# Patient Record
Sex: Female | Born: 1935
Health system: Southern US, Community
[De-identification: ages and names within clinical notes are randomized; demographics above are authoritative.]

## PROBLEM LIST (undated history)

## (undated) DIAGNOSIS — F419 Anxiety disorder, unspecified: Secondary | ICD-10-CM

## (undated) DIAGNOSIS — I10 Essential (primary) hypertension: Secondary | ICD-10-CM

## (undated) DIAGNOSIS — I8390 Asymptomatic varicose veins of unspecified lower extremity: Secondary | ICD-10-CM

## (undated) DIAGNOSIS — I2510 Atherosclerotic heart disease of native coronary artery without angina pectoris: Secondary | ICD-10-CM

## (undated) DIAGNOSIS — M1990 Unspecified osteoarthritis, unspecified site: Secondary | ICD-10-CM

## (undated) DIAGNOSIS — R609 Edema, unspecified: Secondary | ICD-10-CM

## (undated) DIAGNOSIS — Z8739 Personal history of other diseases of the musculoskeletal system and connective tissue: Secondary | ICD-10-CM

## (undated) DIAGNOSIS — K219 Gastro-esophageal reflux disease without esophagitis: Secondary | ICD-10-CM

## (undated) DIAGNOSIS — G8929 Other chronic pain: Secondary | ICD-10-CM

## (undated) DIAGNOSIS — R600 Localized edema: Secondary | ICD-10-CM

## (undated) DIAGNOSIS — N289 Disorder of kidney and ureter, unspecified: Secondary | ICD-10-CM

## (undated) DIAGNOSIS — M1712 Unilateral primary osteoarthritis, left knee: Secondary | ICD-10-CM

## (undated) DIAGNOSIS — R0600 Dyspnea, unspecified: Secondary | ICD-10-CM

## (undated) DIAGNOSIS — R519 Headache, unspecified: Secondary | ICD-10-CM

## (undated) DIAGNOSIS — F32A Depression, unspecified: Secondary | ICD-10-CM

## (undated) DIAGNOSIS — E782 Mixed hyperlipidemia: Secondary | ICD-10-CM

## (undated) DIAGNOSIS — F329 Major depressive disorder, single episode, unspecified: Secondary | ICD-10-CM

## (undated) DIAGNOSIS — R51 Headache: Secondary | ICD-10-CM

## (undated) HISTORY — DX: Essential (primary) hypertension: I10

## (undated) HISTORY — DX: Other chronic pain: G89.29

## (undated) HISTORY — PX: APPENDECTOMY: SHX54

## (undated) HISTORY — DX: Atherosclerotic heart disease of native coronary artery without angina pectoris: I25.10

## (undated) HISTORY — DX: Headache: R51

## (undated) HISTORY — PX: COLONOSCOPY: SHX174

## (undated) HISTORY — DX: Gastro-esophageal reflux disease without esophagitis: K21.9

## (undated) HISTORY — DX: Headache, unspecified: R51.9

## (undated) HISTORY — DX: Asymptomatic varicose veins of unspecified lower extremity: I83.90

## (undated) HISTORY — PX: OTHER SURGICAL HISTORY: SHX169

## (undated) HISTORY — DX: Localized edema: R60.0

## (undated) HISTORY — DX: Mixed hyperlipidemia: E78.2

## (undated) HISTORY — DX: Edema, unspecified: R60.9

## (undated) HISTORY — DX: Anxiety disorder, unspecified: F41.9

## (undated) HISTORY — DX: Unspecified osteoarthritis, unspecified site: M19.90

---

## 1984-09-13 HISTORY — PX: CHOLECYSTECTOMY: SHX55

## 1986-09-13 HISTORY — PX: TOTAL ABDOMINAL HYSTERECTOMY W/ BILATERAL SALPINGOOPHORECTOMY: SHX83

## 1998-04-17 ENCOUNTER — Inpatient Hospital Stay (HOSPITAL_COMMUNITY): Admission: AD | Admit: 1998-04-17 | Discharge: 1998-04-18 | Payer: Self-pay | Admitting: Cardiology

## 2005-12-21 ENCOUNTER — Ambulatory Visit: Payer: Self-pay | Admitting: Cardiology

## 2005-12-22 ENCOUNTER — Inpatient Hospital Stay (HOSPITAL_COMMUNITY): Admission: EM | Admit: 2005-12-22 | Discharge: 2005-12-22 | Payer: Self-pay | Admitting: Cardiology

## 2009-09-13 HISTORY — PX: OTHER SURGICAL HISTORY: SHX169

## 2009-09-18 ENCOUNTER — Encounter: Admission: RE | Admit: 2009-09-18 | Discharge: 2009-09-18 | Payer: Self-pay | Admitting: Orthopedic Surgery

## 2009-10-02 ENCOUNTER — Ambulatory Visit (HOSPITAL_COMMUNITY): Admission: RE | Admit: 2009-10-02 | Discharge: 2009-10-02 | Payer: Self-pay | Admitting: Orthopedic Surgery

## 2009-10-09 ENCOUNTER — Encounter: Admission: RE | Admit: 2009-10-09 | Discharge: 2009-10-09 | Payer: Self-pay | Admitting: Orthopedic Surgery

## 2009-10-16 ENCOUNTER — Encounter: Payer: Self-pay | Admitting: Cardiology

## 2009-10-27 ENCOUNTER — Encounter: Admission: RE | Admit: 2009-10-27 | Discharge: 2009-10-27 | Payer: Self-pay | Admitting: Orthopedic Surgery

## 2009-11-17 ENCOUNTER — Inpatient Hospital Stay (HOSPITAL_COMMUNITY): Admission: RE | Admit: 2009-11-17 | Discharge: 2009-11-19 | Payer: Self-pay | Admitting: Orthopedic Surgery

## 2009-12-22 ENCOUNTER — Encounter: Admission: RE | Admit: 2009-12-22 | Discharge: 2009-12-22 | Payer: Self-pay | Admitting: Orthopedic Surgery

## 2010-05-20 ENCOUNTER — Encounter: Admission: RE | Admit: 2010-05-20 | Discharge: 2010-05-20 | Payer: Self-pay | Admitting: Orthopedic Surgery

## 2010-07-08 ENCOUNTER — Encounter: Admission: RE | Admit: 2010-07-08 | Discharge: 2010-07-08 | Payer: Self-pay | Admitting: Orthopedic Surgery

## 2010-12-06 LAB — BASIC METABOLIC PANEL
BUN: 13 mg/dL (ref 6–23)
BUN: 14 mg/dL (ref 6–23)
CO2: 28 mEq/L (ref 19–32)
Calcium: 8.3 mg/dL — ABNORMAL LOW (ref 8.4–10.5)
Chloride: 104 mEq/L (ref 96–112)
Creatinine, Ser: 0.98 mg/dL (ref 0.4–1.2)
Creatinine, Ser: 1 mg/dL (ref 0.4–1.2)
GFR calc non Af Amer: 56 mL/min — ABNORMAL LOW (ref >60)
Glucose, Bld: 122 mg/dL — ABNORMAL HIGH (ref 70–99)
Glucose, Bld: 127 mg/dL — ABNORMAL HIGH (ref 70–99)
Potassium: 3.7 mEq/L (ref 3.5–5.1)

## 2010-12-06 LAB — CBC
HCT: 29.9 % — ABNORMAL LOW (ref 36.0–46.0)
HCT: 42.6 % (ref 36.0–46.0)
Hemoglobin: 14.4 g/dL (ref 12.0–15.0)
MCHC: 34.4 g/dL (ref 30.0–36.0)
MCV: 93.1 fL (ref 78.0–100.0)
MCV: 93.3 fL (ref 78.0–100.0)
Platelets: 139 10*3/uL — ABNORMAL LOW (ref 150–400)
Platelets: 172 10*3/uL (ref 150–400)
Platelets: 272 10*3/uL (ref 150–400)
RBC: 3.44 MIL/uL — ABNORMAL LOW (ref 3.87–5.11)
RBC: 4.57 MIL/uL (ref 3.87–5.11)
RDW: 13.8 % (ref 11.5–15.5)
RDW: 14.1 % (ref 11.5–15.5)
WBC: 10 10*3/uL (ref 4.0–10.5)

## 2010-12-06 LAB — URINALYSIS, ROUTINE W REFLEX MICROSCOPIC
Bilirubin Urine: NEGATIVE
Glucose, UA: NEGATIVE mg/dL
Ketones, ur: NEGATIVE mg/dL
Nitrite: NEGATIVE
Nitrite: NEGATIVE
Protein, ur: NEGATIVE mg/dL
Specific Gravity, Urine: 1.028 (ref 1.005–1.030)
Urobilinogen, UA: 0.2 mg/dL (ref 0.0–1.0)
pH: 5.5 (ref 5.0–8.0)

## 2010-12-06 LAB — COMPREHENSIVE METABOLIC PANEL
Alkaline Phosphatase: 105 U/L (ref 39–117)
BUN: 25 mg/dL — ABNORMAL HIGH (ref 6–23)
CO2: 26 mEq/L (ref 19–32)
Chloride: 101 mEq/L (ref 96–112)
Creatinine, Ser: 1.13 mg/dL (ref 0.4–1.2)
GFR calc non Af Amer: 47 mL/min — ABNORMAL LOW (ref >60)
Glucose, Bld: 109 mg/dL — ABNORMAL HIGH (ref 70–99)
Potassium: 3.7 mEq/L (ref 3.5–5.1)
Total Bilirubin: 0.6 mg/dL (ref 0.3–1.2)

## 2010-12-06 LAB — PROTIME-INR
INR: 1.13 (ref 0.00–1.49)
INR: 1.19 (ref 0.00–1.49)
Prothrombin Time: 12.5 seconds (ref 11.6–15.2)
Prothrombin Time: 14.4 seconds (ref 11.6–15.2)
Prothrombin Time: 15 seconds (ref 11.6–15.2)

## 2010-12-06 LAB — DIFFERENTIAL
Basophils Absolute: 0 10*3/uL (ref 0.0–0.1)
Basophils Relative: 1 % (ref 0–1)
Lymphocytes Relative: 30 % (ref 12–46)
Neutro Abs: 6.3 10*3/uL (ref 1.7–7.7)
Neutrophils Relative %: 63 % (ref 43–77)

## 2010-12-06 LAB — URINE MICROSCOPIC-ADD ON

## 2010-12-06 LAB — URINE CULTURE: Colony Count: NO GROWTH

## 2010-12-06 LAB — ABO/RH: ABO/RH(D): A NEG

## 2010-12-06 LAB — TYPE AND SCREEN

## 2010-12-06 LAB — APTT: aPTT: 30 seconds (ref 24–37)

## 2011-01-29 NOTE — H&P (Signed)
NAME:  Renee Dudley, Renee Dudley             ACCOUNT NO.:  [id number]   MEDICAL RECORD NO.:  [id number]          PATIENT TYPE:  INP   LOCATION:  6531                         FACILITY:  [facility]   PHYSICIAN:  Colleen Can. Deborah Chalk, M.D.DATE OF BIRTH:  1936/05/27   DATE OF ADMISSION:  12/22/2005  DATE OF DISCHARGE:                                HISTORY & PHYSICAL   CHIEF COMPLAINT:  Chest pain.   HISTORY OF PRESENT ILLNESS:  The patient is an obese 75 year old white  female who presents on transfer from [facility] after having an  episode of chest pain. She presented to Dr. Jeannette How office on the morning  of December 20, 2005 complaining of chest discomfort that was described as a  tight pressure-like sensation. She was immediately sent to the emergency  room where she received oxygen, nitroglycerin sublingually as well as  morphine. The patient lasted for approximately four hours. She noted that  when she had awakened earlier that morning at about 7:30 she had had the  onset. She did feel a little bit lightheaded but had no other associated  symptoms. She does mention that last Thursday night she had had some sharp  chest pain that was completely different from this presentation and that  lasted for just several minutes. Over the course of the past week, she has  just felt sluggish and just not herself. She was subsequently seen in the  emergency room. She was admitted and has ruled out negative for myocardial  infarction. She is now transferred to [facility] for elective  cardiac catheterization. She is currently pain free.   PAST MEDICAL HISTORY:  1.  Hypertension.  2.  Hypercholesterolemia.  3.  Obesity.  4.  Chronic back pain.  5.  Chronic anxiety.  6.  Chronic migraine headaches.  7.  Remote history of chronic hematuria with a urology workup in the 1990s      which was negative.  8.  Total abdominal hysterectomy and bilateral salpingo-oophorectomy in 1988      for  menorrhagia.  9.  History of cholecystectomy in 1986.  10. History of catheterization, she states, three to four years ago;      however, medical records say approximately 10 years ago. She reports      that this was unremarkable. She has had a stress Cardiolite in July of      2005 which was unremarkable and previous dobutamine echocardiogram in      April 2002.   ALLERGIES:  Her allergies are PENICILLIN.   CURRENT MEDICATIONS:  1.  Lasix 40 mg a day.  2.  Flexeril 10 mg at bedtime for spasms.  3.  Topamax 15 mg b.i.d. for migraines.  4.  Aspirin daily.  5.  Stool softeners as needed for constipation.  6.  Hydrocodone 2 times a day for pain.  7.  Xanax p.r.n.  8.  Inderal LA 120 mg for hypertension.   SOCIAL HISTORY:  She is widowed. She lives by herself. She has no alcohol or  tobacco products. She is retired.   FAMILY HISTORY:  Sister died of a  stroke in her 21s. Brother died in a car  accident at the age 67. Mother died age 65 of heart failure following a  stroke. Father died at 60 after having a stroke.   REVIEW OF SYSTEMS:  Basically as noted above. She has really had no other  significant episodes of chest pain. She has felt somewhat weak and fatigued  and has had palpitations. She denies any recent fever, flu or cough. She has  had no shortness of breath, abdominal pain, constipation or diarrhea. She  does have some complaints of chronic lower extremity edema. She has had no  frank syncope.   LABORATORY DATA:  EKG shows no acute changes. Chest x-ray is unremarkable.  Two-D echocardiogram performed showed EF of 60% with no wall motion  abnormalities. CBC was normal. Chemistries were normal except for a glucose  of 118. All cardiac enzymes were negative. INR was 1.0.   OVERALL IMPRESSION:  1.  Prolonged episode of chest pain.  2.  Multiple cardiovascular risk factors which include obesity, hypertension      and hyperlipidemia which is currently not being treated.    PLAN:  She is accepted to transfer to [facility]. Will plan for  cardiac catheterization later today. Will continue with topical nitrites.  Lipid panel will be checked. Will start her on protein pump inhibitor as  well. Other home medications will be continued. Further treatment plan to  follow per Dr. Ronnald Nian discretion.      Sharlee Blew, N.P.      Colleen Can. Deborah Chalk, M.D.  Electronically Signed    LC/MEDQ  D:  12/22/2005  T:  12/22/2005  Job:  [id number]   cc:   Fara Chute  Fax: [phone]   Selinda Flavin  Fax: [phone]

## 2011-01-29 NOTE — Cardiovascular Report (Signed)
NAME:  Renee Dudley, Renee Dudley             ACCOUNT NO.:  [id number]   MEDICAL RECORD NO.:  [id number]          PATIENT TYPE:  INP   LOCATION:  6531                         FACILITY:  [facility]   PHYSICIAN:  Colleen Can. Deborah Chalk, M.D.DATE OF BIRTH:  [birth year]   DATE OF PROCEDURE:  12/22/2005  DATE OF DISCHARGE:                              CARDIAC CATHETERIZATION   DATE OF PROCEDURE:  December 22, 2005   PROCEDURE:  Left heart catheterization with selective coronary angiography  and left ventricular angiography.   TYPE AND SITE OF ANGIOGRAPHY:  Percutaneous right femoral artery.   CATHETER:  A 6-French 4-curved Judkins right and left coronary cath, 6-  French pigtail ventriculographic catheter.   CONTRAST MATERIAL:  Omnipaque.   MEDICATIONS GIVEN PRIOR TO PROCEDURE:  Valium 10 mg p.o.   MEDICATIONS GIVEN DURING THE PROCEDURE:  Versed 2 mg IV.   COMMENTS:  The patient tolerated procedure well.   HEMODYNAMIC DATA:  The aortic pressure was 154/73, LV was 154/3.  There is  no aortic valve gradient noted on pullback.   ANGIOGRAPHIC DATA:  1.  Left main coronary artery is short.  2.  Left circumflex:  Left circumflex had irregularities.  It continues      mainly as a large posterolateral branch and also has two proximal      marginal vessels.  It has minor diffuse irregularities.  3.  Left anterior descending had 20-30% narrowing proximally.  There are      scattered irregularities but no focal obstructive disease otherwise.  4.  Right coronary artery:  The right coronary artery has irregularities at      the acute margin of the heart but no severe focal disease is present.      The right coronary artery is a dominant vessel.  5.  Left ventricular angiogram was performed in the RAO position.  Overall      cardiac size and silhouette are normal.  The global ejection fraction is      60%.  Regional wall motion is normal.   OVERALL IMPRESSION:  1.  Normal left ventricular function.  2.  Mild  scattered coronary atherosclerosis with 20-30% proximal left      anterior descending disease with minor irregularities in the right      coronary artery and left circumflex systems.   DISCUSSION:  Overall, it is felt the patient's chest pain syndrome is not  cardiac in nature.   Angio-Seal was performed.      Colleen Can. Deborah Chalk, M.D.  Electronically Signed     SNT/MEDQ  D:  12/22/2005  T:  12/22/2005  Job:  [id number]   cc:   Fara Chute  Fax: [phone]   Selinda Flavin  Fax: [phone]

## 2011-01-29 NOTE — Discharge Summary (Signed)
NAME:  Renee Dudley, Renee Dudley             ACCOUNT NO.:  [id number]   MEDICAL RECORD NO.:  [id number]          PATIENT TYPE:  INP   LOCATION:  6531                         FACILITY:  [facility]   PHYSICIAN:  Colleen Can. Deborah Chalk, M.D.DATE OF BIRTH:  Jan 01, 1936   DATE OF ADMISSION:  12/22/2005  DATE OF DISCHARGE:  12/22/2005                                 DISCHARGE SUMMARY   PRIMARY DISCHARGE DIAGNOSIS:  Chest pain with negative cardiac enzymes and  subsequent elective cardiac catheterization showing normal left ventricular  function and mild scattered coronary atherosclerosis with 20-30% proximal  left anterior descending artery disease and minor irregularities in the  right coronary and left circumflex system.  It is felt that the patient's  chest pain syndrome is not cardiac in nature.   SECONDARY DISCHARGE DIAGNOSES:  1.  Hypertension.  2.  Hyperlipidemia.  3.  Morbid obesity.  4.  Chronic back pain.  5.  Chronic anxiety.  6.  Chronic migraines.   HISTORY OF PRESENT ILLNESS:  The patient is an obese 75 year old white  female who presented for transfer from [facility] with an episode of  chest discomfort.  She had seen her primary care in the office earlier on  the morning of December 20, 2005 complaining of chest discomfort that was  described as a tight pressure-like sensation.  She was sent to the emergency  room and subsequently transferred here for further evaluation.   Please see dictated history and physical for further patient presentation  and profile.   LABORATORY DATA:  EKG showed no acute changes.  Chest x-ray was  unremarkable.  2-D echocardiogram performed at [facility] showed an  EF of 60% with no wall motion abnormalities.  CBC was normal.  Chemistries  were normal except for glucose of 118.  All cardiac enzymes were negative.  Her INR was 1.0.   HOSPITAL COURSE:  The patient was admitted electively.  She underwent  cardiac catheterization later on that day  which showed normal LV function  and mild coronary disease.  The patient's chest pain was not felt to be  cardiac in nature.  She did have AngioSeal to the right femoral artery and  subsequently was a satisfactory candidate for discharge once bedrest was  complete.   CONDITION ON DISCHARGE:  Stable.   DISCHARGE MEDICATIONS:  Discharge medicines will be continued as per her  previous home routine which includes Lasix 40 mg a day, Flexeril 10 mg at  bed time, Topamax 50 a day, aspirin daily, stool softener daily, enoxaparin  120 a day, Xanax p.r.n. and hydrocodone and Tylenol as needed.   PLAN:  We will see her back in our office in approximately two to three  weeks, certainly sooner if problems arise.  Otherwise, we will have her  follow up with primary care.      Sharlee Blew, N.P.      Colleen Can. Deborah Chalk, M.D.  Electronically Signed    LC/MEDQ  D:  01/19/2006  T:  01/19/2006  Job:  [id number]   cc:   Selinda Flavin  Fax: [phone]   Fara Chute  Fax: [phone]

## 2011-05-13 ENCOUNTER — Encounter: Payer: Self-pay | Admitting: Cardiology

## 2011-05-14 ENCOUNTER — Ambulatory Visit (INDEPENDENT_AMBULATORY_CARE_PROVIDER_SITE_OTHER): Payer: 59 | Admitting: Cardiology

## 2011-05-14 ENCOUNTER — Encounter: Payer: Self-pay | Admitting: Cardiology

## 2011-05-14 VITALS — BP 121/77 | HR 73 | Ht 66.0 in | Wt 239.0 lb

## 2011-05-14 DIAGNOSIS — E782 Mixed hyperlipidemia: Secondary | ICD-10-CM

## 2011-05-14 DIAGNOSIS — I2510 Atherosclerotic heart disease of native coronary artery without angina pectoris: Secondary | ICD-10-CM

## 2011-05-14 DIAGNOSIS — R079 Chest pain, unspecified: Secondary | ICD-10-CM | POA: Insufficient documentation

## 2011-05-14 DIAGNOSIS — I10 Essential (primary) hypertension: Secondary | ICD-10-CM

## 2011-05-14 NOTE — Assessment & Plan Note (Signed)
Single episode, no recurrence since. ECG is nonspecific. After discussing the matter, plan will be observation at this point on medical therapy.

## 2011-05-14 NOTE — Patient Instructions (Signed)
Your physician you to follow up in 1 year. You will receive a reminder letter in the mail one-two months in advance. If you don't receive a letter, please call our office to schedule the follow-up appointment. Your physician recommends that you continue on your current medications as directed. Please refer to the Current Medication list given to you today. 

## 2011-05-14 NOTE — Assessment & Plan Note (Signed)
On statin therapy, followed by Dr. Dimas Aguas. Would aim for LDL close to 70.

## 2011-05-14 NOTE — Assessment & Plan Note (Signed)
Mild, nonobstructive disease by catheterization in 2007. Followup Myoview from last year was low risk with Dr. Deborah Chalk. Continue observation with office followup down the road. We can see her back sooner if symptoms intervene.

## 2011-05-14 NOTE — Assessment & Plan Note (Signed)
Blood pressure well-controlled today. 

## 2011-05-14 NOTE — Progress Notes (Signed)
Clinical Summary Renee Dudley is a 75 y.o.female referred for cardiology consultation by Dr. Dimas Aguas. She had an episode of chest pain and elevated blood pressure requiring observation at [facility] recently. Records were reviewed. She states that this occurred around the time that she was emotionally upset following the death of a close friend. She ruled out for myocardial infarction, and has had no further symptoms since discharge. Reports no major exertional symptomatology, limited by right knee pain.  Record review finds previous cardiac catheterization by Dr. Deborah Chalk back in 2007 which demonstrated mild, nonobstructive CAD. She last saw him in clinic back in January of last year for preoperative assessment prior to right knee replacement, and had a low-risk adenosine Myoview at that time.  We reviewed her medications. She does state that Zoloft is new since her hospital admission approximately one month ago.  Allergies  Allergen Reactions  . Penicillins     Medication list reviewed.  Past Medical History  Diagnosis Date  . Essential hypertension, benign   . Coronary atherosclerosis of native coronary artery   . Mixed hyperlipidemia   . GERD (gastroesophageal reflux disease)   . Coronary atherosclerosis of native coronary artery     Nonobstructive  . Anxiety   . Chronic headaches     Past Surgical History  Procedure Date  . Cystoscopy 1993, 1994  . Appendectomy   . Total abdominal hysterectomy w/ bilateral salpingoophorectomy 1988  . Right total knee arthroplasty 2011  . Cholecystectomy 1986    Family History  Problem Relation Age of Onset  . Stroke Sister   . Stroke Father     Died age 93  . Heart failure Mother     Died age 76    Social History Renee Dudley reports that she has never smoked. She has never used smokeless tobacco. Renee Dudley reports that she does not drink alcohol.  Review of Systems No palpitations, no cough, no orthopnea or PND. No edema. Chronic right  knee pain. Otherwise reviewed and negative.  Physical Examination Filed Vitals:   05/14/11 1325  BP: 121/77  Pulse: 73  Overweight woman in no acute distress. HEENT: Conjunctiva and lids are normal, oropharnyx with moist mucosa. Neck: Supple, no elevated JVP or carotid bruits, no thyromegaly. Lungs: Clear to auscultation, nonlabored. Cardiac: Regular rate and rhythm, no S3 gallop or rub. Abdomen: Soft, nontender, bowel sounds present. Skin.: Warm and dry. Musculoskeletal: No kyphosis. Extremity: No pitting edema, some right knee swelling, well-healed surgical scar. Neuropsychiatric: Alert and oriented x3, affect appropriate.   ECG Reviewed in EMR.  Studies Cardiac catheterization 12/22/2005: ANGIOGRAPHIC DATA:  1.  Left main coronary artery is short.  2.  Left circumflex:  Left circumflex had irregularities.  It continues      mainly as a large posterolateral branch and also has two proximal      marginal vessels.  It has minor diffuse irregularities.  3.  Left anterior descending had 20-30% narrowing proximally.  There are      scattered irregularities but no focal obstructive disease otherwise.  4.  Right coronary artery:  The right coronary artery has irregularities at      the acute margin of the heart but no severe focal disease is present.      The right coronary artery is a dominant vessel.  5.  Left ventricular angiogram was performed in the RAO position.  Overall      cardiac size and silhouette are normal.  The global ejection fraction is  60%.  Regional wall motion is normal.   Problem List and Plan

## 2012-03-28 ENCOUNTER — Other Ambulatory Visit: Payer: Self-pay | Admitting: Orthopedic Surgery

## 2012-03-28 DIAGNOSIS — M545 Low back pain, unspecified: Secondary | ICD-10-CM

## 2012-03-29 ENCOUNTER — Ambulatory Visit
Admission: RE | Admit: 2012-03-29 | Discharge: 2012-03-29 | Disposition: A | Discharge status: Home / Self Care | Payer: 59 | Source: Ambulatory Visit | Attending: Orthopedic Surgery | Admitting: Orthopedic Surgery

## 2012-03-29 ENCOUNTER — Other Ambulatory Visit: Payer: Self-pay | Admitting: Orthopedic Surgery

## 2012-03-29 VITALS — BP 154/77 | HR 63

## 2012-03-29 DIAGNOSIS — M545 Low back pain, unspecified: Secondary | ICD-10-CM

## 2012-03-29 MED ORDER — METHYLPREDNISOLONE ACETATE 40 MG/ML INJ SUSP (RADIOLOG
120.0000 mg | Freq: Once | INTRAMUSCULAR | Status: AC
  Administered 2012-03-29: 120 mg via EPIDURAL

## 2012-03-29 MED ORDER — IOHEXOL 180 MG/ML  SOLN
1.0000 mL | Freq: Once | INTRAMUSCULAR | Status: AC | PRN
  Administered 2012-03-29: 1 mL via EPIDURAL

## 2013-09-21 ENCOUNTER — Encounter: Payer: Self-pay | Admitting: Cardiology

## 2013-09-23 ENCOUNTER — Encounter: Payer: Self-pay | Admitting: Cardiology

## 2013-10-11 ENCOUNTER — Encounter: Payer: Self-pay | Admitting: *Deleted

## 2013-10-11 ENCOUNTER — Encounter: Payer: Self-pay | Admitting: Cardiology

## 2013-10-11 ENCOUNTER — Ambulatory Visit (INDEPENDENT_AMBULATORY_CARE_PROVIDER_SITE_OTHER): Payer: Medicare Other | Admitting: Cardiology

## 2013-10-11 VITALS — BP 115/70 | HR 71 | Ht 65.0 in | Wt 255.0 lb

## 2013-10-11 DIAGNOSIS — I2510 Atherosclerotic heart disease of native coronary artery without angina pectoris: Secondary | ICD-10-CM

## 2013-10-11 DIAGNOSIS — I10 Essential (primary) hypertension: Secondary | ICD-10-CM

## 2013-10-11 DIAGNOSIS — R072 Precordial pain: Secondary | ICD-10-CM

## 2013-10-11 DIAGNOSIS — E782 Mixed hyperlipidemia: Secondary | ICD-10-CM

## 2013-10-11 NOTE — Patient Instructions (Signed)
Your physician has requested that you have a lexiscan myoview. For further information please visit [email or web address]. Please follow instruction sheet, as given. Office will contact with results via phone or letter.   Continue all current medications. Follow up based off test results

## 2013-10-11 NOTE — Assessment & Plan Note (Signed)
Continues on Crestor, followed by Dr. Howard. 

## 2013-10-11 NOTE — Progress Notes (Signed)
Clinical Summary Ms. Popper is a 78 y.o.female last seen in August 2012. Record review finds recent admission at [facility] with chest pain concerning for unstable angina. She was admitted by the hospitalist team and ruled out for myocardial infarction. She was placed on Imdur and continued on medical therapy. Also diagnosed with UTI which was treated.  Recent lab work shows BUN 18, creatinine 0.9, potassium 3.9, hemoglobin 10.8, platelets 209.  She reports fewer episodes of chest pain since being on long-acting nitrate, did have to use a sublingual nitroglycerin recently with exertional chest pain. We reviewed her history and discussed her symptoms today, also further options for evaluation and management.   Allergies  Allergen Reactions  . Penicillins Other (See Comments)    Pt can remember, happened 50 years ago  . Zocor [Simvastatin]     Current Outpatient Prescriptions  Medication Sig Dispense Refill  . ALPRAZolam (XANAX) 0.25 MG tablet Take 0.25 mg by mouth daily as needed.        [name] aspirin 81 MG tablet Take 81 mg by mouth daily.        [name] atenolol (TENORMIN) 50 MG tablet Take 50 mg by mouth daily.        . cyclobenzaprine (FLEXERIL) 10 MG tablet Take 10 mg by mouth daily.        [name] docusate sodium (COLACE) 100 MG capsule Take 100-400 mg by mouth at bedtime.        [name] esomeprazole (NEXIUM) 40 MG capsule Take 40 mg by mouth daily before breakfast.        . furosemide (LASIX) 40 MG tablet Take 40 mg by mouth daily.        [name] HYDROcodone-acetaminophen (NORCO/VICODIN) 5-325 MG per tablet Take 1 tablet by mouth every 6 (six) hours as needed for moderate pain.      . isosorbide mononitrate (ISMO,MONOKET) 20 MG tablet Take 20 mg by mouth daily.      . nitroGLYCERIN (NITROSTAT) 0.4 MG SL tablet Place 0.4 mg under the tongue every 5 (five) minutes as needed for chest pain.      . potassium chloride SA (K-DUR,KLOR-CON) 20 MEQ tablet Take 20 mEq by mouth daily.      . rosuvastatin (CRESTOR) 5  MG tablet Take 5 mg by mouth daily.      . sertraline (ZOLOFT) 25 MG tablet Take 25 mg by mouth daily.        [name] topiramate (TOPAMAX) 50 MG tablet Take 50 mg by mouth 2 (two) times daily.         No current facility-administered medications for this visit.    Past Medical History  Diagnosis Date  . Essential hypertension, benign   . Mixed hyperlipidemia   . GERD (gastroesophageal reflux disease)   . Coronary atherosclerosis of native coronary artery     Nonobstructive by catheterization 2007  . Anxiety   . Chronic headaches   . DJD (degenerative joint disease)   . Obesity   . Varicose veins   . Peripheral edema     Social History Ms. Virtue reports that she has never smoked. She has never used smokeless tobacco. Ms. Wigle reports that she does not drink alcohol.  Review of Systems No palpitations, dizziness, syncope. Has chronic knee pain following joint replacement, limits her ambulation.  Physical Examination Filed Vitals:   10/11/13 1431  BP: 115/70  Pulse: 71   Filed Weights   10/11/13 1431  Weight: 255 lb (115.667 kg)  Overweight woman in no acute distress.  HEENT: Conjunctiva and lids are normal, oropharnyx with moist mucosa.  Neck: Supple, no elevated JVP or carotid bruits, no thyromegaly.  Lungs: Clear to auscultation, nonlabored.  Cardiac: Regular rate and rhythm, no S3 gallop or rub.  Abdomen: Soft, nontender, bowel sounds present.  Skin.: Warm and dry.  Musculoskeletal: No kyphosis.  Extremity: No pitting edema, some right knee swelling. Neuropsychiatric: Alert and oriented x3, affect appropriate.   Problem List and Plan   Coronary atherosclerosis of native coronary artery History of nonobstructive disease as of 2007, although symptoms of increasing angina over the last several weeks. She recently ruled out for MI during hospital observation at [facility]. Agree with the addition of long-acting nitrate. Will proceed with further evaluation via  Lexiscan Cardiolite, and then decide whether we continue with medical therapy and observation, or consider invasive cardiac evaluation.  Essential hypertension, benign Blood pressure is normal today.  Mixed hyperlipidemia Continues on Crestor, followed by Dr. Nadara Mustard.    Satira Sark, M.D., F.A.C.C.

## 2013-10-11 NOTE — Assessment & Plan Note (Signed)
History of nonobstructive disease as of 2007, although symptoms of increasing angina over the last several weeks. She recently ruled out for MI during hospital observation at [facility]. Agree with the addition of long-acting nitrate. Will proceed with further evaluation via Lexiscan Cardiolite, and then decide whether we continue with medical therapy and observation, or consider invasive cardiac evaluation.

## 2013-10-11 NOTE — Assessment & Plan Note (Signed)
Blood pressure is normal today. 

## 2013-10-23 ENCOUNTER — Encounter (HOSPITAL_COMMUNITY): Payer: Self-pay

## 2013-10-23 ENCOUNTER — Encounter (HOSPITAL_COMMUNITY)
Admission: RE | Admit: 2013-10-23 | Discharge: 2013-10-23 | Disposition: A | Discharge status: Home / Self Care | Payer: Medicare Other | Source: Ambulatory Visit | Attending: Cardiology | Admitting: Cardiology

## 2013-10-23 DIAGNOSIS — R079 Chest pain, unspecified: Secondary | ICD-10-CM

## 2013-10-23 DIAGNOSIS — E785 Hyperlipidemia, unspecified: Secondary | ICD-10-CM | POA: Insufficient documentation

## 2013-10-23 DIAGNOSIS — I10 Essential (primary) hypertension: Secondary | ICD-10-CM | POA: Insufficient documentation

## 2013-10-23 DIAGNOSIS — I209 Angina pectoris, unspecified: Secondary | ICD-10-CM | POA: Insufficient documentation

## 2013-10-23 DIAGNOSIS — R072 Precordial pain: Secondary | ICD-10-CM

## 2013-10-23 DIAGNOSIS — I2510 Atherosclerotic heart disease of native coronary artery without angina pectoris: Secondary | ICD-10-CM

## 2013-10-23 MED ORDER — TECHNETIUM TC 99M SESTAMIBI GENERIC - CARDIOLITE
10.0000 | Freq: Once | INTRAVENOUS | Status: AC | PRN
  Administered 2013-10-23: 10 via INTRAVENOUS

## 2013-10-23 MED ORDER — REGADENOSON 0.4 MG/5ML IV SOLN
INTRAVENOUS | Status: AC
  Administered 2013-10-23: 0.4 mg via INTRAVENOUS
  Filled 2013-10-23: qty 5

## 2013-10-23 MED ORDER — TECHNETIUM TC 99M SESTAMIBI - CARDIOLITE
30.0000 | Freq: Once | INTRAVENOUS | Status: AC | PRN
Start: 2013-10-23 — End: 2013-10-23
  Administered 2013-10-23: 30 via INTRAVENOUS

## 2013-10-23 MED ORDER — SODIUM CHLORIDE 0.9 % IJ SOLN
INTRAMUSCULAR | Status: AC
  Administered 2013-10-23: 10 mL via INTRAVENOUS
  Filled 2013-10-23: qty 10

## 2013-10-23 NOTE — Progress Notes (Signed)
Stress Lab Nurses Notes - [name]  Renee Dudley 10/23/2013 Reason for doing test: Chest Pain Type of test: Wille Glaser Nurse performing test: Gerrit Halls, RN Nuclear Medicine Tech: Melburn Hake Echo Tech: Not Applicable MD performing test: S. McDowell/K.Purcell Nails NP Family MD: Nadara Mustard Test explained and consent signed: yes IV started: 22g jelco, Saline lock flushed, No redness or edema and Saline lock started in radiology Symptoms:headache  Treatment/Intervention: None Reason test stopped: protocol completed After recovery IV was: Discontinued via X-ray tech and No redness or edema Patient to return to [facility]. Med at : 12:00 Patient discharged: Home Patient's Condition upon discharge was: stable Comments:During test BP 108/53 & HR 76.  Recovery BP 121/58 & HR 65.  Symptoms resolved in recovery.  Geanie Cooley T

## 2013-11-07 ENCOUNTER — Ambulatory Visit: Payer: 59 | Admitting: Cardiology

## 2014-01-07 ENCOUNTER — Encounter: Payer: Self-pay | Admitting: Cardiology

## 2014-01-07 ENCOUNTER — Ambulatory Visit (INDEPENDENT_AMBULATORY_CARE_PROVIDER_SITE_OTHER): Payer: Medicare Other | Admitting: Cardiology

## 2014-01-07 VITALS — BP 138/77 | HR 62 | Ht 66.0 in | Wt 258.0 lb

## 2014-01-07 DIAGNOSIS — I10 Essential (primary) hypertension: Secondary | ICD-10-CM

## 2014-01-07 DIAGNOSIS — I2510 Atherosclerotic heart disease of native coronary artery without angina pectoris: Secondary | ICD-10-CM

## 2014-01-07 DIAGNOSIS — E782 Mixed hyperlipidemia: Secondary | ICD-10-CM

## 2014-01-07 DIAGNOSIS — R0989 Other specified symptoms and signs involving the circulatory and respiratory systems: Secondary | ICD-10-CM

## 2014-01-07 NOTE — Assessment & Plan Note (Signed)
Symptomatically stable on medical therapy, followup Cardiolite noted above. Plan will be to continue observation unless her symptoms progress. Six-month visit scheduled.

## 2014-01-07 NOTE — Assessment & Plan Note (Signed)
No change to current regimen. 

## 2014-01-07 NOTE — Assessment & Plan Note (Signed)
Questionable on left. She does have known coronary atherosclerosis, already on aspirin and Crestor. We will obtain carotid Dopplers to exclude any significant obstruction that might require further intervention.

## 2014-01-07 NOTE — Assessment & Plan Note (Signed)
Continues on Crestor, followed by Dr. Howard. 

## 2014-01-07 NOTE — Patient Instructions (Signed)
Your physician recommends that you schedule a follow-up appointment in: 6 months. You will receive a reminder letter in the mail in about 4 months reminding you to call and schedule your appointment. If you don't receive this letter, please contact our office. Your physician recommends that you continue on your current medications as directed. Please refer to the Current Medication list given to you today. Your physician has requested that you have a carotid duplex. This test is an ultrasound of the carotid arteries in your neck. It looks at blood flow through these arteries that supply the brain with blood. Allow one hour for this exam. There are no restrictions or special instructions.

## 2014-01-07 NOTE — Progress Notes (Signed)
Clinical Summary Renee Dudley is a 78 y.o.female last seen in January of this year. Followup Lexiscan Cardiolite was obtained in February showing no diagnostic ST segment changes, breast attenuation without clear evidence of scar or ischemia, LVEF 77%. She was managed medically.  Importantly, she has been doing well without any significant angina symptoms. She reports typical ADLs without limitation. States that she spent most of the winter months making Christmas ornaments which has been her traditional the last 20 years.  She does tell me that she is worried about her carotid arteries. She is not having symptoms, however there is a significant history of stroke in her family. She has never had carotid Dopplers based on record review.   Allergies  Allergen Reactions  . Penicillins Other (See Comments)    Pt can remember, happened 50 years ago  . Levofloxacin Other (See Comments)    Weakness   . Zocor [Simvastatin]     Current Outpatient Prescriptions  Medication Sig Dispense Refill  . ALPRAZolam (XANAX) 0.25 MG tablet Take 0.25 mg by mouth daily as needed.        [name] aspirin 81 MG tablet Take 81 mg by mouth daily.        [name] atenolol (TENORMIN) 50 MG tablet Take 50 mg by mouth daily.        . cyclobenzaprine (FLEXERIL) 10 MG tablet Take 10 mg by mouth daily.        [name] docusate sodium (COLACE) 100 MG capsule Take 100-400 mg by mouth at bedtime.        [name] esomeprazole (NEXIUM) 40 MG capsule Take 40 mg by mouth daily before breakfast.        . furosemide (LASIX) 40 MG tablet Take 40 mg by mouth daily.        [name] HYDROcodone-acetaminophen (NORCO/VICODIN) 5-325 MG per tablet Take 1 tablet by mouth every 6 (six) hours as needed for moderate pain.      . nitroGLYCERIN (NITROSTAT) 0.4 MG SL tablet Place 0.4 mg under the tongue every 5 (five) minutes as needed for chest pain.      . rosuvastatin (CRESTOR) 5 MG tablet Take 5 mg by mouth daily.      . sertraline (ZOLOFT) 25 MG tablet Take 25 mg by  mouth daily.        [name] topiramate (TOPAMAX) 50 MG tablet Take 50 mg by mouth 2 (two) times daily.         No current facility-administered medications for this visit.    Past Medical History  Diagnosis Date  . Essential hypertension, benign   . Mixed hyperlipidemia   . GERD (gastroesophageal reflux disease)   . Coronary atherosclerosis of native coronary artery     Nonobstructive by catheterization 2007  . Anxiety   . Chronic headaches   . DJD (degenerative joint disease)   . Obesity   . Varicose veins   . Peripheral edema     Social History Renee Dudley reports that she has never smoked. She has never used smokeless tobacco. Renee Dudley reports that she does not drink alcohol.  Review of Systems Negative except as outlined.  Physical Examination Filed Vitals:   01/07/14 1107  BP: 138/77  Pulse: 62   Filed Weights   01/07/14 1107  Weight: 258 lb (117.028 kg)    Overweight woman in no acute distress.  HEENT: Conjunctiva and lids are normal, oropharnyx with moist mucosa.  Neck: Supple, no elevated JVP, questionable left carotid bruit, no  thyromegaly.  Lungs: Clear to auscultation, nonlabored.  Cardiac: Regular rate and rhythm, no S3 gallop or rub.  Abdomen: Soft, nontender, bowel sounds present.  Skin.: Warm and dry.  Musculoskeletal: No kyphosis.  Extremity: No pitting edema, some right knee swelling.  Neuropsychiatric: Alert and oriented x3, affect appropriate.   Problem List and Plan   Coronary atherosclerosis of native coronary artery Symptomatically stable on medical therapy, followup Cardiolite noted above. Plan will be to continue observation unless her symptoms progress. Six-month visit scheduled.  Carotid bruit Questionable on left. She does have known coronary atherosclerosis, already on aspirin and Crestor. We will obtain carotid Dopplers to exclude any significant obstruction that might require further intervention.  Essential hypertension, benign No  change to current regimen.  Mixed hyperlipidemia Continues on Crestor, followed by Dr. Nadara Mustard.    Satira Sark, M.D., F.A.C.C.

## 2014-01-09 ENCOUNTER — Encounter (INDEPENDENT_AMBULATORY_CARE_PROVIDER_SITE_OTHER): Payer: Medicare Other

## 2014-01-09 DIAGNOSIS — R0989 Other specified symptoms and signs involving the circulatory and respiratory systems: Secondary | ICD-10-CM

## 2014-01-09 DIAGNOSIS — I6529 Occlusion and stenosis of unspecified carotid artery: Secondary | ICD-10-CM

## 2014-01-17 ENCOUNTER — Telehealth: Payer: Self-pay | Admitting: *Deleted

## 2014-01-17 NOTE — Telephone Encounter (Signed)
Patient informed. 

## 2014-01-17 NOTE — Telephone Encounter (Signed)
Message copied by Merlene Laughter on Thu Jan 17, 2014  2:57 PM ------      Message from: Merlene Laughter      Created: Mon Jan 14, 2014  7:40 AM                   ----- Message -----         From: Satira Sark, MD         Sent: 01/10/2014  10:02 AM           To: Laurine Blazer, LPN            Reviewed report. Please let her know that there is only mild atherosclerotic carotid plaque noted. No indication for surgery. She is on aspirin and statin which should be continued. ------

## 2014-04-10 ENCOUNTER — Emergency Department (HOSPITAL_COMMUNITY): Payer: Medicare Other

## 2014-04-10 ENCOUNTER — Encounter (HOSPITAL_COMMUNITY): Payer: Self-pay | Admitting: Emergency Medicine

## 2014-04-10 ENCOUNTER — Observation Stay (HOSPITAL_COMMUNITY)
Admission: EM | Admit: 2014-04-10 | Discharge: 2014-04-11 | Disposition: A | Discharge status: Home / Self Care | Payer: Medicare Other | Attending: Family Medicine | Admitting: Family Medicine

## 2014-04-10 DIAGNOSIS — Z79899 Other long term (current) drug therapy: Secondary | ICD-10-CM | POA: Insufficient documentation

## 2014-04-10 DIAGNOSIS — E860 Dehydration: Secondary | ICD-10-CM | POA: Diagnosis not present

## 2014-04-10 DIAGNOSIS — I2510 Atherosclerotic heart disease of native coronary artery without angina pectoris: Secondary | ICD-10-CM | POA: Diagnosis not present

## 2014-04-10 DIAGNOSIS — E782 Mixed hyperlipidemia: Secondary | ICD-10-CM | POA: Diagnosis present

## 2014-04-10 DIAGNOSIS — F411 Generalized anxiety disorder: Secondary | ICD-10-CM | POA: Diagnosis not present

## 2014-04-10 DIAGNOSIS — R079 Chest pain, unspecified: Secondary | ICD-10-CM | POA: Diagnosis not present

## 2014-04-10 DIAGNOSIS — I10 Essential (primary) hypertension: Secondary | ICD-10-CM | POA: Diagnosis present

## 2014-04-10 DIAGNOSIS — Z7901 Long term (current) use of anticoagulants: Secondary | ICD-10-CM | POA: Insufficient documentation

## 2014-04-10 DIAGNOSIS — E876 Hypokalemia: Secondary | ICD-10-CM | POA: Diagnosis not present

## 2014-04-10 DIAGNOSIS — K219 Gastro-esophageal reflux disease without esophagitis: Secondary | ICD-10-CM | POA: Insufficient documentation

## 2014-04-10 DIAGNOSIS — R0602 Shortness of breath: Secondary | ICD-10-CM | POA: Diagnosis not present

## 2014-04-10 DIAGNOSIS — N390 Urinary tract infection, site not specified: Secondary | ICD-10-CM | POA: Diagnosis not present

## 2014-04-10 DIAGNOSIS — Z7982 Long term (current) use of aspirin: Secondary | ICD-10-CM | POA: Diagnosis not present

## 2014-04-10 DIAGNOSIS — Z66 Do not resuscitate: Secondary | ICD-10-CM | POA: Diagnosis not present

## 2014-04-10 DIAGNOSIS — I498 Other specified cardiac arrhythmias: Secondary | ICD-10-CM | POA: Diagnosis not present

## 2014-04-10 DIAGNOSIS — Z6841 Body Mass Index (BMI) 40.0 and over, adult: Secondary | ICD-10-CM | POA: Insufficient documentation

## 2014-04-10 DIAGNOSIS — Z86718 Personal history of other venous thrombosis and embolism: Secondary | ICD-10-CM | POA: Insufficient documentation

## 2014-04-10 DIAGNOSIS — N179 Acute kidney failure, unspecified: Secondary | ICD-10-CM

## 2014-04-10 MED ORDER — SODIUM CHLORIDE 0.9 % IV BOLUS (SEPSIS)
500.0000 mL | Freq: Once | INTRAVENOUS | Status: AC
  Administered 2014-04-10: 500 mL via INTRAVENOUS

## 2014-04-10 MED ORDER — ASPIRIN 81 MG PO CHEW: 324.0000 mg | CHEWABLE_TABLET | Freq: Once | ORAL | Status: DC

## 2014-04-10 NOTE — ED Notes (Signed)
Per EMS, pt. Has weakness and chest pressure since Saturday. Pt. Reports feeling short of breath tonight. Pt. Reports taking 1 Nitro at 1530 with relief. Pt. Reports pain started again around 2130. Pt. Given 324 of Asprin en route.

## 2014-04-10 NOTE — ED Provider Notes (Signed)
TIME SEEN: 11:40 PM  CHIEF COMPLAINT: Chest pain, generalized weakness  HPI: Patient is a 78 year old female with history of hypertension, hyperlipidemia, nonobstructive CAD seen on cardiac catheterization in 2007 who presents to the emergency department with generalized weakness and started on Saturday, 4 days ago. She states that 2 days ago she began having substernal chest pressure without radiation and associated shortness of breath was worse with exertion and relieved with nitroglycerin. No diaphoresis, dizziness or nausea or vomiting. She states she's been eating and drinking well. No fevers, cough. No lower extremity swelling or pain. Last stress test was carried 2015 and was normal.  PCP is Dr. Nadara Mustard  ROS: See HPI Constitutional: no fever  Eyes: no drainage  ENT: no runny nose   Cardiovascular:   chest pain  Resp: SOB  GI: no vomiting GU: no dysuria Integumentary: no rash  Allergy: no hives  Musculoskeletal: no leg swelling  Neurological: no slurred speech ROS otherwise negative  PAST MEDICAL HISTORY/PAST SURGICAL HISTORY:  Past Medical History  Diagnosis Date  . Essential hypertension, benign   . Mixed hyperlipidemia   . GERD (gastroesophageal reflux disease)   . Coronary atherosclerosis of native coronary artery     Nonobstructive by catheterization 2007  . Anxiety   . Chronic headaches   . DJD (degenerative joint disease)   . Obesity   . Varicose veins   . Peripheral edema     MEDICATIONS:  Prior to Admission medications   Medication Sig Start Date End Date Taking? Authorizing Provider  ALPRAZolam (XANAX) 0.25 MG tablet Take 0.25 mg by mouth daily as needed.      Historical Provider, MD  aspirin 81 MG tablet Take 81 mg by mouth daily.      Historical Provider, MD  atenolol (TENORMIN) 50 MG tablet Take 50 mg by mouth daily.      Historical Provider, MD  cyclobenzaprine (FLEXERIL) 10 MG tablet Take 10 mg by mouth daily.      Historical Provider, MD  docusate  sodium (COLACE) 100 MG capsule Take 100-400 mg by mouth at bedtime.      Historical Provider, MD  esomeprazole (NEXIUM) 40 MG capsule Take 40 mg by mouth daily before breakfast.      Historical Provider, MD  furosemide (LASIX) 40 MG tablet Take 40 mg by mouth daily.      Historical Provider, MD  HYDROcodone-acetaminophen (NORCO/VICODIN) 5-325 MG per tablet Take 1 tablet by mouth every 6 (six) hours as needed for moderate pain.    Historical Provider, MD  nitroGLYCERIN (NITROSTAT) 0.4 MG SL tablet Place 0.4 mg under the tongue every 5 (five) minutes as needed for chest pain.    Historical Provider, MD  rosuvastatin (CRESTOR) 5 MG tablet Take 5 mg by mouth daily.    Historical Provider, MD  sertraline (ZOLOFT) 25 MG tablet Take 25 mg by mouth daily.      Historical Provider, MD  topiramate (TOPAMAX) 50 MG tablet Take 50 mg by mouth 2 (two) times daily.      Historical Provider, MD    ALLERGIES:  Allergies  Allergen Reactions  . Penicillins Other (See Comments)    Pt can remember, happened 50 years ago  . Levofloxacin Other (See Comments)    Weakness   . Zocor [Simvastatin]     SOCIAL HISTORY:  History  Substance Use Topics  . Smoking status: Never Smoker   . Smokeless tobacco: Never Used  . Alcohol Use: No    FAMILY HISTORY: Family  History  Problem Relation Age of Onset  . Stroke Sister     Died in her 73's  . Stroke Father     Died age 61  . Heart failure Mother     Died age 65  . Stroke Mother     EXAM: BP 157/48  Pulse 63  Temp(Src) 98 F (36.7 C) (Oral)  Resp 14  Ht 5\' 6"  (1.676 m)  Wt 249 lb (112.946 kg)  BMI 40.21 kg/m2  SpO2 100% CONSTITUTIONAL: Alert and oriented and responds appropriately to questions. Well-appearing; well-nourished HEAD: Normocephalic EYES: Conjunctivae clear, PERRL ENT: normal nose; no rhinorrhea; moist mucous membranes; pharynx without lesions noted NECK: Supple, no meningismus, no LAD  CARD: RRR; S1 and S2 appreciated; no murmurs, no  clicks, no rubs, no gallops RESP: Normal chest excursion without splinting or tachypnea; breath sounds clear and equal bilaterally; no wheezes, no rhonchi, no rales,  ABD/GI: Normal bowel sounds; non-distended; soft, non-tender, no rebound, no guarding BACK:  The back appears normal and is non-tender to palpation, there is no CVA tenderness EXT: Normal ROM in all joints; non-tender to palpation; no edema; normal capillary refill; no cyanosis    SKIN: Normal color for age and race; warm NEURO: Moves all extremities equally, sensation to light touch intact diffusely, cranial nerves II through XII intact PSYCH: The patient's mood and manner are appropriate. Grooming and personal hygiene are appropriate.  MEDICAL DECISION MAKING: Patient here with concerning story for ACS but having a recent negative stress test. She does have multiple risk factors for the same. She is currently chest pain-free. Doubt dissection or PE given she is not having pain. EKG shows no new ischemic changes. We'll obtain cardiac labs. She's also complaining of feeling very weak for the past several days which may be related to her chest pain but could also be secondary to dehydration, electrolyte abnormality, anemia or infection. We'll obtain urine and give IV fluids.  ED PROGRESS: Patient appears to have a urinary tract infection. We'll give ceftriaxone. Urine culture pending. Her creatinine is also elevated from her baseline at 1.4. We'll continue IV hydration. Troponin negative. Discussed with Dr. Darrick Meigs for admission to observation, telemetry for IV hydration, antibiotics and serial troponins.     Date: 04/10/2014 23:32  Rate: 58  Rhythm: normal sinus rhythm  QRS Axis: normal  Intervals: normal  ST/T Wave abnormalities: normal  Conduction Disutrbances: none  Narrative Interpretation: unremarkable      [place], DO 04/11/14 0228

## 2014-04-11 ENCOUNTER — Encounter (HOSPITAL_COMMUNITY): Payer: Self-pay

## 2014-04-11 DIAGNOSIS — R0602 Shortness of breath: Secondary | ICD-10-CM | POA: Diagnosis not present

## 2014-04-11 DIAGNOSIS — E782 Mixed hyperlipidemia: Secondary | ICD-10-CM | POA: Diagnosis not present

## 2014-04-11 DIAGNOSIS — I10 Essential (primary) hypertension: Secondary | ICD-10-CM

## 2014-04-11 DIAGNOSIS — R079 Chest pain, unspecified: Secondary | ICD-10-CM | POA: Diagnosis not present

## 2014-04-11 DIAGNOSIS — N390 Urinary tract infection, site not specified: Secondary | ICD-10-CM

## 2014-04-11 DIAGNOSIS — N179 Acute kidney failure, unspecified: Secondary | ICD-10-CM

## 2014-04-11 LAB — COMPREHENSIVE METABOLIC PANEL
ALT: 7 U/L (ref 0–35)
ANION GAP: 11 (ref 5–15)
AST: 8 U/L (ref 0–37)
Albumin: 3.2 g/dL — ABNORMAL LOW (ref 3.5–5.2)
Alkaline Phosphatase: 87 U/L (ref 39–117)
BUN: 31 mg/dL — AB (ref 6–23)
CO2: 27 meq/L (ref 19–32)
CREATININE: 1.36 mg/dL — AB (ref 0.50–1.10)
Calcium: 8.9 mg/dL (ref 8.4–10.5)
Chloride: 105 mEq/L (ref 96–112)
GFR, EST AFRICAN AMERICAN: 43 mL/min — AB (ref >90)
GFR, EST NON AFRICAN AMERICAN: 37 mL/min — AB (ref >90)
GLUCOSE: 121 mg/dL — AB (ref 70–99)
Potassium: 3.4 mEq/L — ABNORMAL LOW (ref 3.7–5.3)
SODIUM: 143 meq/L (ref 137–147)
TOTAL PROTEIN: 6.3 g/dL (ref 6.0–8.3)
Total Bilirubin: <0.2 mg/dL — ABNORMAL LOW (ref 0.3–1.2)

## 2014-04-11 LAB — URINE MICROSCOPIC-ADD ON

## 2014-04-11 LAB — CBC WITH DIFFERENTIAL/PLATELET
Basophils Absolute: 0 10*3/uL (ref 0.0–0.1)
Basophils Relative: 0 % (ref 0–1)
EOS ABS: 0.1 10*3/uL (ref 0.0–0.7)
EOS PCT: 1 % (ref 0–5)
HCT: 36.6 % (ref 36.0–46.0)
Hemoglobin: 11.9 g/dL — ABNORMAL LOW (ref 12.0–15.0)
LYMPHS ABS: 2.3 10*3/uL (ref 0.7–4.0)
Lymphocytes Relative: 26 % (ref 12–46)
MCH: 29 pg (ref 26.0–34.0)
MCHC: 32.5 g/dL (ref 30.0–36.0)
MCV: 89.3 fL (ref 78.0–100.0)
MONOS PCT: 7 % (ref 3–12)
Monocytes Absolute: 0.6 10*3/uL (ref 0.1–1.0)
Neutro Abs: 5.8 10*3/uL (ref 1.7–7.7)
Neutrophils Relative %: 66 % (ref 43–77)
PLATELETS: 237 10*3/uL (ref 150–400)
RBC: 4.1 MIL/uL (ref 3.87–5.11)
RDW: 14.9 % (ref 11.5–15.5)
WBC: 8.8 10*3/uL (ref 4.0–10.5)

## 2014-04-11 LAB — BASIC METABOLIC PANEL
Anion gap: 14 (ref 5–15)
BUN: 28 mg/dL — AB (ref 6–23)
CALCIUM: 9.1 mg/dL (ref 8.4–10.5)
CO2: 25 mEq/L (ref 19–32)
Chloride: 102 mEq/L (ref 96–112)
Creatinine, Ser: 1.38 mg/dL — ABNORMAL HIGH (ref 0.50–1.10)
GFR calc Af Amer: 42 mL/min — ABNORMAL LOW (ref >90)
GFR, EST NON AFRICAN AMERICAN: 36 mL/min — AB (ref >90)
Glucose, Bld: 121 mg/dL — ABNORMAL HIGH (ref 70–99)
Potassium: 3.4 mEq/L — ABNORMAL LOW (ref 3.7–5.3)
SODIUM: 141 meq/L (ref 137–147)

## 2014-04-11 LAB — URINALYSIS, ROUTINE W REFLEX MICROSCOPIC
GLUCOSE, UA: NEGATIVE mg/dL
Nitrite: NEGATIVE
Protein, ur: NEGATIVE mg/dL
Specific Gravity, Urine: 1.02 (ref 1.005–1.030)
Urobilinogen, UA: 0.2 mg/dL (ref 0.0–1.0)
pH: 6.5 (ref 5.0–8.0)

## 2014-04-11 LAB — TROPONIN I
Troponin I: <0.3 ng/mL (ref <0.30)
Troponin I: <0.3 ng/mL (ref <0.30)
Troponin I: <0.3 ng/mL (ref <0.30)

## 2014-04-11 LAB — CBC
HEMATOCRIT: 34.9 % — AB (ref 36.0–46.0)
Hemoglobin: 11.1 g/dL — ABNORMAL LOW (ref 12.0–15.0)
MCH: 28.6 pg (ref 26.0–34.0)
MCHC: 31.8 g/dL (ref 30.0–36.0)
MCV: 89.9 fL (ref 78.0–100.0)
Platelets: 219 10*3/uL (ref 150–400)
RBC: 3.88 MIL/uL (ref 3.87–5.11)
RDW: 15 % (ref 11.5–15.5)
WBC: 7.4 10*3/uL (ref 4.0–10.5)

## 2014-04-11 LAB — LIPASE, BLOOD: Lipase: 15 U/L (ref 11–59)

## 2014-04-11 LAB — MRSA PCR SCREENING: MRSA by PCR: NEGATIVE

## 2014-04-11 MED ORDER — PANTOPRAZOLE SODIUM 40 MG IV SOLR
40.0000 mg | INTRAVENOUS | Status: DC
  Administered 2014-04-11: 40 mg via INTRAVENOUS
  Filled 2014-04-11: qty 40

## 2014-04-11 MED ORDER — CYCLOBENZAPRINE HCL 10 MG PO TABS
10.0000 mg | ORAL_TABLET | Freq: Every day | ORAL | Status: DC
  Administered 2014-04-11: 10 mg via ORAL
  Filled 2014-04-11: qty 1

## 2014-04-11 MED ORDER — ATENOLOL 25 MG PO TABS
50.0000 mg | ORAL_TABLET | Freq: Every day | ORAL | Status: DC
  Administered 2014-04-11: 50 mg via ORAL
  Filled 2014-04-11: qty 2

## 2014-04-11 MED ORDER — ATENOLOL 50 MG PO TABS: 25.0000 mg | ORAL_TABLET | Freq: Two times a day (BID) | ORAL | Status: DC

## 2014-04-11 MED ORDER — DEXTROSE 5 % IV SOLN: 1.0000 g | INTRAVENOUS | Status: DC

## 2014-04-11 MED ORDER — TOPIRAMATE 25 MG PO TABS
50.0000 mg | ORAL_TABLET | Freq: Two times a day (BID) | ORAL | Status: DC
  Administered 2014-04-11: 50 mg via ORAL
  Filled 2014-04-11 (×7): qty 2

## 2014-04-11 MED ORDER — ASPIRIN 81 MG PO TABS
81.0000 mg | ORAL_TABLET | Freq: Every day | ORAL | Status: DC
  Filled 2014-04-11 (×2): qty 1

## 2014-04-11 MED ORDER — DOCUSATE SODIUM 100 MG PO CAPS: 100.0000 mg | ORAL_CAPSULE | Freq: Every day | ORAL | Status: DC

## 2014-04-11 MED ORDER — SERTRALINE HCL 50 MG PO TABS
25.0000 mg | ORAL_TABLET | Freq: Every day | ORAL | Status: DC
  Administered 2014-04-11: 25 mg via ORAL
  Filled 2014-04-11 (×3): qty 1

## 2014-04-11 MED ORDER — ASPIRIN EC 81 MG PO TBEC
81.0000 mg | DELAYED_RELEASE_TABLET | Freq: Every day | ORAL | Status: DC
  Administered 2014-04-11: 81 mg via ORAL
  Filled 2014-04-11: qty 1

## 2014-04-11 MED ORDER — DOCUSATE SODIUM 100 MG PO CAPS: 400.0000 mg | ORAL_CAPSULE | Freq: Every day | ORAL | Status: DC

## 2014-04-11 MED ORDER — ACETAMINOPHEN 325 MG PO TABS: 650.0000 mg | ORAL_TABLET | Freq: Four times a day (QID) | ORAL | Status: DC | PRN

## 2014-04-11 MED ORDER — SODIUM CHLORIDE 0.9 % IV SOLN
INTRAVENOUS | Status: AC
  Administered 2014-04-11: 02:00:00 via INTRAVENOUS

## 2014-04-11 MED ORDER — PANTOPRAZOLE SODIUM 40 MG PO TBEC
40.0000 mg | DELAYED_RELEASE_TABLET | Freq: Every day | ORAL | Status: DC
  Filled 2014-04-11: qty 1

## 2014-04-11 MED ORDER — HYDROCODONE-ACETAMINOPHEN 5-325 MG PO TABS: 1.0000 | ORAL_TABLET | Freq: Four times a day (QID) | ORAL | Status: DC | PRN

## 2014-04-11 MED ORDER — POTASSIUM CHLORIDE CRYS ER 20 MEQ PO TBCR
40.0000 meq | EXTENDED_RELEASE_TABLET | Freq: Once | ORAL | Status: AC
  Administered 2014-04-11: 40 meq via ORAL
  Filled 2014-04-11: qty 2

## 2014-04-11 MED ORDER — ALUM & MAG HYDROXIDE-SIMETH 200-200-20 MG/5ML PO SUSP: 30.0000 mL | Freq: Four times a day (QID) | ORAL | Status: DC | PRN

## 2014-04-11 MED ORDER — ATORVASTATIN CALCIUM 10 MG PO TABS: 10.0000 mg | ORAL_TABLET | Freq: Every day | ORAL | Status: DC

## 2014-04-11 MED ORDER — ENOXAPARIN SODIUM 40 MG/0.4ML ~~LOC~~ SOLN
40.0000 mg | SUBCUTANEOUS | Status: DC
  Administered 2014-04-11: 40 mg via SUBCUTANEOUS
  Filled 2014-04-11: qty 0.4

## 2014-04-11 MED ORDER — ACETAMINOPHEN 650 MG RE SUPP: 650.0000 mg | Freq: Four times a day (QID) | RECTAL | Status: DC | PRN

## 2014-04-11 MED ORDER — DEXTROSE 5 % IV SOLN
1.0000 g | Freq: Once | INTRAVENOUS | Status: AC
  Administered 2014-04-11: 1 g via INTRAVENOUS
  Filled 2014-04-11: qty 10

## 2014-04-11 MED ORDER — DEXTROSE 5 % IV SOLN
1.0000 g | INTRAVENOUS | Status: DC
  Filled 2014-04-11: qty 10

## 2014-04-11 MED ORDER — ALPRAZOLAM 0.25 MG PO TABS: 0.2500 mg | ORAL_TABLET | Freq: Every day | ORAL | Status: DC | PRN

## 2014-04-11 MED ORDER — MORPHINE SULFATE 2 MG/ML IJ SOLN: 1.0000 mg | INTRAMUSCULAR | Status: DC | PRN

## 2014-04-11 NOTE — Progress Notes (Signed)
The patient is receiving Protonix by the intravenous route.  Based on criteria approved by the Pharmacy and [facility], the medication is being converted to the equivalent oral dose form.  These criteria include: -No Active GI bleeding -Able to tolerate diet of full liquids (or better) or tube feeding OR able to tolerate other medications by the oral or enteral route  If you have any questions about this conversion, please contact the Pharmacy Department (ext [phone]).  Thank you.  Renee Dudley, [facility] 04/11/2014 10:47 AM

## 2014-04-11 NOTE — H&P (Signed)
PCP:   [name], MD   Chief Complaint:  Chest pain  HPI:  78 year old female who  has a past medical history of Essential hypertension, benign; Mixed hyperlipidemia; GERD (gastroesophageal reflux disease); Coronary atherosclerosis of native coronary artery; Anxiety; Chronic headaches; DJD (degenerative joint disease); Obesity; Varicose veins; and Peripheral edema. Today presents to the ED with two-day history of chest pain. As per patient last week on Saturday she started feeling generalized weakness, and she was unable to do her usual chores. The weakness persisted on Sunday, though she was feeling better. On Tuesday patient started having chest pain, which has been intermittent, lasting for couple of hours with each episode. Not associated with shortness of breath no nausea vomiting or diarrhea. The chest pain is located close to epigastric region, she describes it as pressure. Patient had a negative cardiac stress test in February 2015 which showed low risk for myocardial ischemia.  Patient denies fever, or dysuria, she admits to having increased frequency of urination. In the ED patient found to have mild dehydration, troponin is negative. EKG shows normal sinus rhythm  Allergies:   Allergies  Allergen Reactions  . Penicillins Other (See Comments)    Pt can remember, happened 50 years ago  . Levofloxacin Other (See Comments)    Weakness   . Zocor [Simvastatin]       Past Medical History  Diagnosis Date  . Essential hypertension, benign   . Mixed hyperlipidemia   . GERD (gastroesophageal reflux disease)   . Coronary atherosclerosis of native coronary artery     Nonobstructive by catheterization 2007  . Anxiety   . Chronic headaches   . DJD (degenerative joint disease)   . Obesity   . Varicose veins   . Peripheral edema     Past Surgical History  Procedure Laterality Date  . Cystoscopy  1993, 1994  . Appendectomy    . Total abdominal hysterectomy w/ bilateral  salpingoophorectomy  1988  . Right total knee arthroplasty  2011  . Cholecystectomy  1986    Prior to Admission medications   Medication Sig Start Date End Date Taking? Authorizing Provider  ALPRAZolam (XANAX) 0.25 MG tablet Take 0.25 mg by mouth daily as needed.      Historical Provider, MD  aspirin 81 MG tablet Take 81 mg by mouth daily.      Historical Provider, MD  atenolol (TENORMIN) 50 MG tablet Take 50 mg by mouth daily.      Historical Provider, MD  cyclobenzaprine (FLEXERIL) 10 MG tablet Take 10 mg by mouth daily.      Historical Provider, MD  docusate sodium (COLACE) 100 MG capsule Take 100-400 mg by mouth at bedtime.      Historical Provider, MD  esomeprazole (NEXIUM) 40 MG capsule Take 40 mg by mouth daily before breakfast.      Historical Provider, MD  furosemide (LASIX) 40 MG tablet Take 40 mg by mouth daily.      Historical Provider, MD  HYDROcodone-acetaminophen (NORCO/VICODIN) 5-325 MG per tablet Take 1 tablet by mouth every 6 (six) hours as needed for moderate pain.    Historical Provider, MD  nitroGLYCERIN (NITROSTAT) 0.4 MG SL tablet Place 0.4 mg under the tongue every 5 (five) minutes as needed for chest pain.    Historical Provider, MD  rosuvastatin (CRESTOR) 5 MG tablet Take 5 mg by mouth daily.    Historical Provider, MD  sertraline (ZOLOFT) 25 MG tablet Take 25 mg by mouth daily.  Historical Provider, MD  topiramate (TOPAMAX) 50 MG tablet Take 50 mg by mouth 2 (two) times daily.      Historical Provider, MD    Social History:  reports that she has never smoked. She has never used smokeless tobacco. She reports that she does not drink alcohol or use illicit drugs.  Family History  Problem Relation Age of Onset  . Stroke Sister     Died in her 19's  . Stroke Father     Died age 40  . Heart failure Mother     Died age 80  . Stroke Mother      All the positives are listed in BOLD  Review of Systems:  HEENT: Headache, blurred vision, runny nose, sore  throat Neck: Hypothyroidism, hyperthyroidism,,lymphadenopathy Chest : Shortness of breath, history of COPD, Asthma Heart : Chest pain, history of coronary arterey disease GI:  Nausea, vomiting, diarrhea, constipation, GERD GU: Dysuria, urgency, frequency of urination, hematuria Neuro: Stroke, seizures, syncope Psych: Depression, anxiety, hallucinations   Physical Exam: Blood pressure 109/64, pulse 48, temperature 98 F (36.7 C), temperature source Oral, resp. rate 15, height 5\' 6"  (1.676 m), weight 112.946 kg (249 lb), SpO2 95.00%. Constitutional:   Patient is a well-developed and well-nourished *female in no acute distress and cooperative with exam. Head: Normocephalic and atraumatic Mouth: Mucus membranes moist Eyes: PERRL, EOMI, conjunctivae normal Neck: Supple, No Thyromegaly Cardiovascular: RRR, S1 normal, S2 normal Pulmonary/Chest: CTAB, no wheezes, rales, or rhonchi Abdominal: Soft. Positive epigastric tenderness to palpation non-distended, bowel sounds are normal, no masses, organomegaly, or guarding present.  Neurological: A&O x3, Strenght is normal and symmetric bilaterally, cranial nerve II-XII are grossly intact, no focal motor deficit, sensory intact to light touch bilaterally.  Extremities : No Cyanosis, Clubbing or Edema  Labs on Admission:  Basic Metabolic Panel:  Recent Labs Lab 04/10/14 2338  NA 141  K 3.4*  CL 102  CO2 25  GLUCOSE 121*  BUN 28*  CREATININE 1.38*  CALCIUM 9.1   Liver Function Tests: No results found for this basename: AST, ALT, ALKPHOS, BILITOT, PROT, ALBUMIN,  in the last 168 hours No results found for this basename: LIPASE, AMYLASE,  in the last 168 hours No results found for this basename: AMMONIA,  in the last 168 hours CBC:  Recent Labs Lab 04/10/14 2338  WBC 8.8  NEUTROABS 5.8  HGB 11.9*  HCT 36.6  MCV 89.3  PLT 237   Cardiac Enzymes:  Recent Labs Lab 04/10/14 2338  TROPONINI <0.30    B Radiological Exams on  Admission: Dg Chest 2 View  04/11/2014   CLINICAL DATA:  Chest pain and shortness of breath tonight.  EXAM: CHEST  2 VIEW  COMPARISON:  09/21/2013  FINDINGS: The heart size and mediastinal contours are within normal limits. Both lungs are clear. The visualized skeletal structures are unremarkable.  IMPRESSION: No active cardiopulmonary disease.   Electronically Signed   By: Lucienne Capers M.D.   On: 04/11/2014 00:31    EKG: Independently reviewed. Normal sinus rhythm   Assessment/Plan Active Problems:   Mixed hyperlipidemia   Essential hypertension, benign   Chest pain   UTI (lower urinary tract infection)  Chest pain Located more close to epigastric region, but feels like a pressure. We'll admit the patient in telemetry and obtain serial cardiac enzymes. First set of troponin is negative in the ED. Patient had a negative stress test done in February 2015. If cardiac enzymes are negative, patient can be discharged home.  GERD Patient has history of GERD and takes Nexium at home. Will check lipase to rule out pancreatitis. We'll start the patient on IV Protonix 40 mg daily.  UTI Patient has abnormal UA, with 21-50 WBCs per high-power field. She has received a dose of Rocephin, will continue Rocephin 1 g IV every 24 hours. Follow the urine culture results  Dehydration Patient has mild dehydration, with creatinine 1.38, and BUN 28. We'll hold the Lasix and start IV fluids. Follow BMP in a.m.  Hypokalemia Replace potassium  DVT prophylaxis Lovenox  Code status: Patient is DO NOT RESUSCITATE  Family discussion: No family at bedside   Time Spent on Admission: 60 minutes  [facility] Pager: [phone] 04/11/2014, 3:05 AM  If 7PM-7AM, please contact night-coverage  [email or web address]  Password [email or web address]

## 2014-04-11 NOTE — Discharge Instructions (Signed)
Please take your atenolol 25 mg twice a day instead of 50 mg daily as her heart rate was low. Please get further care as an outpatient for anxiety and other issues as an outpatient with your regular physician if you feel this is warranted

## 2014-04-11 NOTE — Progress Notes (Signed)
Patient states she already has an appointment with PCP in two weeks. States she will keep this appointment.

## 2014-04-11 NOTE — Progress Notes (Signed)
Patient given discharge information with no questions. Daughter at bedside also verbalized understanding of discharge instructions. Patient taken out of facility by RN in wheelchair. Left with daughter in stable condition via personal vehicle.

## 2014-04-11 NOTE — Discharge Summary (Signed)
Physician Discharge Summary  Renee Dudley MEQ:[id number] DOB: [birth year] DOA: 04/10/2014  PCP: Renee Percy, MD  Admit date: 04/10/2014 Discharge date: 04/11/2014  Time spent: 35 minutes  Recommendations for Outpatient Follow-up:  1. Patient admitted with noncardiogenic chest pain-please follow up as an out-patient    Discharge Diagnoses:  Active Problems:   Mixed hyperlipidemia   Essential hypertension, benign   Chest pain   UTI (lower urinary tract infection)   Discharge Condition: Fair  Diet recommendation: Heart healthy  Filed Weights   04/10/14 2333 04/11/14 0504  Weight: 112.946 kg (249 lb) 112.9 kg (248 lb 14.4 oz)    History of present illness:  78 y/o ?, known CP s/p cath 12/22/2005 c mild disease 20-30% LAD, Lexiscan 10/2013 no ST-T 's LVEF 77%, neg dopplers 01/09/14, HLd, admitted with substernal discomfort since 04/06/14. Stats this was assosciated with weakness and discomfort in LE's. No evidence of diaphoresis, arm pain, radiating pain.  EKG on admission showed nsr, troponins neg.  Patient admitted for CP r/o and troponins x3 was negative. Repeat EKG morning of discharge 7/30 did not show any concerning signs for ischemia She was however found to have bradycardia 40-50 and was instructed to cut her atenolol in half to 25 mg twice a day and this persisted with recheck of pulse rate on discharge consult her cardiologist EKG morning of discharge showed sinus bradycardia PR interval 0 QRS axis -10, no concerning ST-T wave changes Patient was discharged home in the steady state with information that this is noncardiogenic chest pain-especially in view of the fact that her recent lexisc was negative Patient herself attributed this probably to increasing stress and emotional trauma and was encouraged to follow up with her primary care for mood stabilization     Discharge Exam: Filed Vitals:   04/11/14 0800  BP: 128/56  Pulse: 57  Temp:   Resp: 17    General:  Alert wasn't oriented no apparent distress  Cardiovascular: S1-S2 no murmur rub or gallop  Respiratory: Clinically clear no added sounds   Discharge Instructions You were cared for by a hospitalist during your hospital stay. If you have any questions about your discharge medications or the care you received while you were in the hospital after you are discharged, you can call the unit and asked to speak with the hospitalist on call if the hospitalist that took care of you is not available. Once you are discharged, your primary care physician will handle any further medical issues. Please note that NO REFILLS for any discharge medications will be authorized once you are discharged, as it is imperative that you return to your primary care physician (or establish a relationship with a primary care physician if you do not have one) for your aftercare needs so that they can reassess your need for medications and monitor your lab values.   Follow-up Information   Follow up with Renee Percy, MD. Call in 1 week. (for follow up and Rx anxiety)    Specialty:  Family Medicine   Contact information:   [address] [address] [phone]       Follow up with Renee Lesches, MD In 2 months.   Specialty:  Cardiology   Contact information:   [place] [address] [phone]          Medication List    ASK your doctor about these medications       ALPRAZolam 0.25 MG tablet  Commonly known as:  XANAX  Take 0.25 mg by mouth daily as needed for anxiety.     aspirin 81 MG tablet  Take 81 mg by mouth daily.     atenolol 50 MG tablet  Commonly known as:  TENORMIN  Take 50 mg by mouth daily.     cyclobenzaprine 10 MG tablet  Commonly known as:  FLEXERIL  Take 10 mg by mouth daily.     docusate sodium 100 MG capsule  Commonly known as:  COLACE  Take 400 mg by mouth at bedtime.     esomeprazole 40 MG capsule  Commonly known as:  NEXIUM  Take 40 mg by mouth  daily before breakfast.     furosemide 40 MG tablet  Commonly known as:  LASIX  Take 40 mg by mouth daily.     HYDROcodone-acetaminophen 5-325 MG per tablet  Commonly known as:  NORCO/VICODIN  Take 1 tablet by mouth every 6 (six) hours as needed for moderate pain.     nitroGLYCERIN 0.4 MG SL tablet  Commonly known as:  NITROSTAT  Place 0.4 mg under the tongue every 5 (five) minutes as needed for chest pain.     rosuvastatin 5 MG tablet  Commonly known as:  CRESTOR  Take 5 mg by mouth 3 (three) times a week. Monday, Wednesday, and Friday.     sertraline 25 MG tablet  Commonly known as:  ZOLOFT  Take 25 mg by mouth daily.     topiramate 50 MG tablet  Commonly known as:  TOPAMAX  Take 50 mg by mouth 2 (two) times daily.     VOLTAREN 1 % Gel  Generic drug:  diclofenac sodium  Apply 1 application topically daily.       Allergies  Allergen Reactions  . Penicillins Other (See Comments)    Pt can remember, happened 50 years ago  . Levofloxacin Other (See Comments)    Weakness   . Zocor [Simvastatin] Other (See Comments)    unknown      The results of significant diagnostics from this hospitalization (including imaging, microbiology, ancillary and laboratory) are listed below for reference.    Significant Diagnostic Studies: Dg Chest 2 View  04/11/2014   CLINICAL DATA:  Chest pain and shortness of breath tonight.  EXAM: CHEST  2 VIEW  COMPARISON:  09/21/2013  FINDINGS: The heart size and mediastinal contours are within normal limits. Both lungs are clear. The visualized skeletal structures are unremarkable.  IMPRESSION: No active cardiopulmonary disease.   Electronically Signed   By: Lucienne Capers M.D.   On: 04/11/2014 00:31    Microbiology: Recent Results (from the past 240 hour(s))  MRSA PCR SCREENING     Status: None   Collection Time    04/11/14  5:00 AM      Result Value Ref Range Status   MRSA by PCR NEGATIVE  NEGATIVE Final   Comment:            The  GeneXpert MRSA Assay (FDA     approved for NASAL specimens     only), is one component of a     comprehensive MRSA colonization     surveillance program. It is not     intended to diagnose MRSA     infection nor to guide or     monitor treatment for     MRSA infections.     Labs: Basic Metabolic Panel:  Recent Labs Lab 04/10/14 2338 04/11/14 0332  NA 141 143  K 3.4*  3.4*  CL 102 105  CO2 25 27  GLUCOSE 121* 121*  BUN 28* 31*  CREATININE 1.38* 1.36*  CALCIUM 9.1 8.9   Liver Function Tests:  Recent Labs Lab 04/11/14 0332  AST 8  ALT 7  ALKPHOS 87  BILITOT <0.2*  PROT 6.3  ALBUMIN 3.2*    Recent Labs Lab 04/11/14 0448  LIPASE 15   No results found for this basename: AMMONIA,  in the last 168 hours CBC:  Recent Labs Lab 04/10/14 2338 04/11/14 0332  WBC 8.8 7.4  NEUTROABS 5.8  --   HGB 11.9* 11.1*  HCT 36.6 34.9*  MCV 89.3 89.9  PLT 237 219   Cardiac Enzymes:  Recent Labs Lab 04/10/14 2338 04/11/14 0332 04/11/14 0915  TROPONINI <0.30 <0.30 <0.30   BNP: BNP (last 3 results) No results found for this basename: PROBNP,  in the last 8760 hours CBG: No results found for this basename: GLUCAP,  in the last 168 hours     Signed:  Nita Sells  [facility] 04/11/2014, 11:42 AM

## 2014-04-11 NOTE — ED Notes (Signed)
Dr. Lama at bedside. 

## 2014-04-12 LAB — URINE CULTURE: Colony Count: 85000

## 2014-11-27 ENCOUNTER — Encounter: Payer: Self-pay | Admitting: *Deleted

## 2014-11-28 ENCOUNTER — Ambulatory Visit (INDEPENDENT_AMBULATORY_CARE_PROVIDER_SITE_OTHER): Payer: Medicare Other | Admitting: Cardiology

## 2014-11-28 ENCOUNTER — Encounter: Payer: Self-pay | Admitting: Cardiology

## 2014-11-28 VITALS — BP 130/67 | HR 50 | Ht 66.0 in | Wt 239.0 lb

## 2014-11-28 DIAGNOSIS — I2510 Atherosclerotic heart disease of native coronary artery without angina pectoris: Secondary | ICD-10-CM | POA: Diagnosis not present

## 2014-11-28 DIAGNOSIS — I6523 Occlusion and stenosis of bilateral carotid arteries: Secondary | ICD-10-CM | POA: Diagnosis not present

## 2014-11-28 DIAGNOSIS — Z01810 Encounter for preprocedural cardiovascular examination: Secondary | ICD-10-CM | POA: Diagnosis not present

## 2014-11-28 NOTE — Progress Notes (Signed)
Cardiology Office Note  Date: 11/28/2014   ID: Renee Dudley, DOB 02/14/1936, MRN [id number]  PCP: [name], MD  Primary Cardiologist: [name], MD   Chief Complaint  Patient presents with  . Preoperative evaluation    History of Present Illness: Renee Dudley is a 79 y.o. female last seen in April 2015. She has end-stage osteoarthritis involving the left knee and is being considered for left total knee replacement by Dr. Noemi Chapel. She is already status post right total knee replacement.  Cardiac history is outlined below including nonobstructive CAD by cardiac catheterization in 2007 with normal Cardiolite study from last year in follow-up. She reports no angina symptoms, stable NYHA class II dyspnea. I reviewed her cardiac medications which are unchanged. ECG done today shows sinus bradycardia. She remains functional in her ADLs, describes activities exceeding 4 METs.   Past Medical History  Diagnosis Date  . Essential hypertension, benign   . Mixed hyperlipidemia   . GERD (gastroesophageal reflux disease)   . Coronary atherosclerosis of native coronary artery     Nonobstructive by catheterization 2007  . Anxiety   . Chronic headaches   . DJD (degenerative joint disease)   . Obesity   . Varicose veins   . Peripheral edema     Past Surgical History  Procedure Laterality Date  . Cystoscopy  1993, 1994  . Appendectomy    . Total abdominal hysterectomy w/ bilateral salpingoophorectomy  1988  . Right total knee arthroplasty  2011  . Cholecystectomy  1986    Current Outpatient Prescriptions  Medication Sig Dispense Refill  . ALPRAZolam (XANAX) 0.25 MG tablet Take 0.25 mg by mouth daily as needed for anxiety.     [name] aspirin 81 MG tablet Take 81 mg by mouth daily.      [name] atenolol (TENORMIN) 50 MG tablet Take 0.5 tablets (25 mg total) by mouth 2 (two) times daily.    . ciprofloxacin (CIPRO) 250 MG tablet Take 250 mg by mouth 2 (two) times daily.    [name]  esomeprazole (NEXIUM) 40 MG capsule Take 40 mg by mouth daily before breakfast.      . furosemide (LASIX) 40 MG tablet Take 40 mg by mouth daily.      [name] HYDROcodone-acetaminophen (LORTAB) 10-500 MG per tablet Take 1 tablet by mouth at bedtime.    . potassium chloride SA (K-DUR,KLOR-CON) 20 MEQ tablet Take 10 mEq by mouth daily.    . sertraline (ZOLOFT) 25 MG tablet Take 25 mg by mouth daily.      [name] topiramate (TOPAMAX) 50 MG tablet Take 50 mg by mouth 2 (two) times daily.       No current facility-administered medications for this visit.    Allergies:  Penicillins; Levofloxacin; and Zocor   Social History: The patient  reports that she has never smoked. She has never used smokeless tobacco. She reports that she does not drink alcohol or use illicit drugs.     ROS:  Please see the history of present illness. Otherwise, complete review of systems is positive for chronic knee pain.  All other systems are reviewed and negative.    Physical Exam: VS:  BP 130/67 mmHg  Pulse 50  Ht 5\' 6"  (1.676 m)  Wt 239 lb (108.41 kg)  BMI 38.59 kg/m2  SpO2 100%, BMI Body mass index is 38.59 kg/(m^2).  Wt Readings from Last 3 Encounters:  11/28/14 239 lb (108.41 kg)  04/11/14 248 lb 14.4 oz (112.9 kg)  01/07/14 258 lb (117.028 kg)     Overweight woman in no acute distress.  HEENT: Conjunctiva and lids are normal, oropharnyx with moist mucosa.  Neck: Supple, no elevated JVP, questionable left carotid bruit, no thyromegaly.  Lungs: Clear to auscultation, nonlabored.  Cardiac: Regular rate and rhythm, no S3 gallop or rub.  Abdomen: Soft, nontender, bowel sounds present.  Skin.: Warm and dry.  Musculoskeletal: No kyphosis.  Extremity: No pitting edema, some right knee swelling.  Neuropsychiatric: Alert and oriented x3, affect appropriate.   ECG: Tracing from 04/11/2014 showed sinus bradycardia with left atrial enlargement and decreased R wave progression.   Recent Labwork: 04/11/2014:  ALT 7; AST 8; BUN 31*; Creatinine 1.36*; Hemoglobin 11.1*; Platelets 219; Potassium 3.4*; Sodium 143   Other Studies Reviewed Today:  1. Lexiscan Cardiolite in February 2015 showed no diagnostic ST segment changes, breast attenuation without clear evidence of scar or ischemia, LVEF 77%.  2. Carotid Dopplers 01/09/2014 showed 1-39% bilateral ICA stenoses.   Assessment and Plan:  1. Preoperative evaluation prior to elective left knee replacement by Dr. Noemi Chapel. Patient is stable from a cardiac perspective with history of nonobstructive CAD, reassuring Cardiolite from last year, and no active angina symptoms. ECG is also stable. She should be able to proceed with planned surgery at an overall low perioperative cardiac risk. Reasonable to hold aspirin for the procedure if needed. Otherwise continue her current regimen. Our service can see her during hospitalization if the need arises. Otherwise keep routine follow-up.  2. Mild carotid artery atherosclerosis based on Dopplers from last year.  3. Essential hypertension, no change in current regimen.   Current medicines are reviewed at length with the patient today.    Orders Placed This Encounter  Procedures  . EKG 12-Lead    Disposition: FU with me in 6 months.   Signed, Satira Sark, MD, [facility] 11/28/2014 3:09 PM    [facility] [place], [place],  [address] Phone: [phone]; Fax: [phone]

## 2014-11-28 NOTE — Patient Instructions (Signed)

## 2014-12-11 ENCOUNTER — Encounter (HOSPITAL_COMMUNITY): Payer: Self-pay | Admitting: Physician Assistant

## 2014-12-11 DIAGNOSIS — M1712 Unilateral primary osteoarthritis, left knee: Secondary | ICD-10-CM | POA: Diagnosis present

## 2014-12-11 NOTE — H&P (Signed)
TOTAL KNEE ADMISSION H&P  Patient is being admitted for left total knee arthroplasty.  Subjective:  Chief Complaint:left knee pain.  HPI: Renee Dudley, 79 y.o. female, has a history of pain and functional disability in the left knee due to arthritis and has failed non-surgical conservative treatments for greater than 12 weeks to includeNSAID's and/or analgesics, corticosteriod injections, viscosupplementation injections, flexibility and strengthening excercises, supervised PT with diminished ADL's post treatment, weight reduction as appropriate and activity modification.  Onset of symptoms was gradual, starting 10 years ago with gradually worsening course since that time. The patient noted no past surgery on the left knee(s).  Patient currently rates pain in the left knee(s) at 9 out of 10 with activity. Patient has night pain, worsening of pain with activity and weight bearing, pain that interferes with activities of daily living, crepitus and joint swelling.  Patient has evidence of subchondral sclerosis, periarticular osteophytes and joint space narrowing by imaging studies. There is no active infection.  Patient Active Problem List   Diagnosis Date Noted  . Primary localized osteoarthritis of left knee   . Chest pain 04/11/2014  . UTI (lower urinary tract infection) 04/11/2014  . Carotid bruit 01/07/2014  . Coronary atherosclerosis of native coronary artery 05/14/2011  . Mixed hyperlipidemia 05/14/2011  . Essential hypertension, benign 05/14/2011  . Chest pain 05/14/2011   Past Medical History  Diagnosis Date  . Mixed hyperlipidemia   . DJD (degenerative joint disease)   . Peripheral edema     takes Lasix daily  . Primary localized osteoarthritis of left knee   . Anxiety     takes Xanax daily  . Depression     takes Zoloft daily  . Essential hypertension, benign     takes Atenolol daily  . Chronic headaches     takes Topamax daily  . Coronary atherosclerosis of native  coronary artery     takes ASA daily  . Constipation     takes Colace nightly  . Joint pain   . History of gout     colchicine as needed  . GERD (gastroesophageal reflux disease)     takes Nexium daily  . UTI (lower urinary tract infection)     x 4 in past yr-most recent a month ago;was treated with antibiotic that completed 3 wks ago    Past Surgical History  Procedure Laterality Date  . Appendectomy    . Total abdominal hysterectomy w/ bilateral salpingoophorectomy  1988  . Right total knee arthroplasty  2011  . Cholecystectomy  1986  . Colonoscopy    . Cataract surgery Bilateral     No current facility-administered medications for this encounter.  Current outpatient prescriptions:  .  ALPRAZolam (XANAX) 0.25 MG tablet, Take 0.5 mg by mouth daily as needed for anxiety. , Disp: , Rfl:  .  aspirin 81 MG tablet, Take 81 mg by mouth daily.  , Disp: , Rfl:  .  atenolol (TENORMIN) 50 MG tablet, Take 0.5 tablets (25 mg total) by mouth 2 (two) times daily. (Patient taking differently: Take 50 mg by mouth daily. ), Disp: , Rfl:  .  docusate sodium (COLACE) 100 MG capsule, Take 400 mg by mouth at bedtime., Disp: , Rfl:  .  esomeprazole (NEXIUM) 40 MG capsule, Take 40 mg by mouth daily before breakfast.  , Disp: , Rfl:  .  furosemide (LASIX) 40 MG tablet, Take 40 mg by mouth daily.  , Disp: , Rfl:  .  HYDROcodone-acetaminophen (LORTAB) 10-500 MG  per tablet, Take 1 tablet by mouth at bedtime., Disp: , Rfl:  .  potassium chloride SA (K-DUR,KLOR-CON) 20 MEQ tablet, Take 10 mEq by mouth daily., Disp: , Rfl:  .  sertraline (ZOLOFT) 25 MG tablet, Take 25 mg by mouth at bedtime. , Disp: , Rfl:  .  topiramate (TOPAMAX) 50 MG tablet, Take 50 mg by mouth 2 (two) times daily.  , Disp: , Rfl:     Allergies  Allergen Reactions  . Penicillins Other (See Comments)    Pt can remember, happened 50 years ago  . Levofloxacin Other (See Comments)    Weakness   . Zocor [Simvastatin] Other (See Comments)     unknown      History  Substance Use Topics  . Smoking status: Never Smoker   . Smokeless tobacco: Never Used  . Alcohol Use: No    Family History  Problem Relation Age of Onset  . Stroke Sister     Died in her 91's  . Stroke Father     Died age 54  . Heart failure Mother     Died age 56  . Stroke Mother      Review of Systems  Constitutional: Negative.   HENT: Negative.   Eyes: Negative.   Respiratory: Negative.   Cardiovascular: Negative.   Gastrointestinal: Negative.   Genitourinary: Negative.   Musculoskeletal: Positive for joint pain.       Left knee pain  Skin: Negative.   Neurological: Negative.   Endo/Heme/Allergies: Negative.   Psychiatric/Behavioral: Negative.     Objective:  Physical Exam  Constitutional: She is oriented to person, place, and time. She appears well-developed and well-nourished.  HENT:  Head: Normocephalic and atraumatic.  Mouth/Throat: Oropharynx is clear and moist.  Eyes: Conjunctivae and EOM are normal. Pupils are equal, round, and reactive to light.  Neck: Neck supple.  GI: Soft. Bowel sounds are normal.  Genitourinary:  Not pertinent to current symptomatology therefore not examined.  Musculoskeletal:  Examination of her left knee reveals pain medially and laterally, 1+ crepitation, 1+ synovitis.  Range of motion -5 to 125 degrees.  Knee is stable with diffuse pain and normal patellar tracking.  Examination of the right knee reveals well-healed total knee incision without swelling or pain.  Full range of motion.  Knee is stable with normal patellar tracking.    Neurological: She is alert and oriented to person, place, and time.  Skin: Skin is warm and dry.  Psychiatric: She has a normal mood and affect. Her behavior is normal.    Vital signs in last 24 hours:    Labs:   Estimated body mass index is 38.43 kg/(m^2) as calculated from the following:   Height as of this encounter: 5\' 6"  (1.676 m).   Weight as of this  encounter: 107.956 kg (238 lb).   Imaging Review Plain radiographs demonstrate moderate degenerative joint disease of the left knee(s). The overall alignment issignificant varus. The bone quality appears to be good for age and reported activity level.  Assessment/Plan:  End stage arthritis, left knee  Principal Problem:   Primary localized osteoarthritis of left knee Active Problems:   Coronary atherosclerosis of native coronary artery   Mixed hyperlipidemia   Essential hypertension, benign   Carotid bruit   The patient history, physical examination, clinical judgment of the provider and imaging studies are consistent with end stage degenerative joint disease of the left knee(s) and total knee arthroplasty is deemed medically necessary. The treatment options including medical  management, injection therapy arthroscopy and arthroplasty were discussed at length. The risks and benefits of total knee arthroplasty were presented and reviewed. The risks due to aseptic loosening, infection, stiffness, patella tracking problems, thromboembolic complications and other imponderables were discussed. The patient acknowledged the explanation, agreed to proceed with the plan and consent was signed. Patient is being admitted for inpatient treatment for surgery, pain control, PT, OT, prophylactic antibiotics, VTE prophylaxis, progressive ambulation and ADL's and discharge planning. The patient is planning to be discharged home with home health services   Urine showed bacteria in it.  Started on Ceftin 500mg  BID and will use antibiotic in the cement.  Edge Mauger A. Kaleen Mask Physician Assistant [facility] [phone]  12/15/2014, 2:16 PM

## 2014-12-12 ENCOUNTER — Encounter (HOSPITAL_COMMUNITY): Payer: Self-pay

## 2014-12-12 ENCOUNTER — Encounter (HOSPITAL_COMMUNITY)
Admission: RE | Admit: 2014-12-12 | Discharge: 2014-12-12 | Disposition: A | Discharge status: Home / Self Care | Payer: Medicare Other | Source: Ambulatory Visit | Attending: Orthopedic Surgery | Admitting: Orthopedic Surgery

## 2014-12-12 HISTORY — DX: Depression, unspecified: F32.A

## 2014-12-12 HISTORY — DX: Personal history of other diseases of the musculoskeletal system and connective tissue: Z87.39

## 2014-12-12 HISTORY — DX: Major depressive disorder, single episode, unspecified: F32.9

## 2014-12-12 LAB — CBC WITH DIFFERENTIAL/PLATELET
Basophils Absolute: 0 10*3/uL (ref 0.0–0.1)
Basophils Relative: 0 % (ref 0–1)
EOS ABS: 0.1 10*3/uL (ref 0.0–0.7)
Eosinophils Relative: 1 % (ref 0–5)
HCT: 37.2 % (ref 36.0–46.0)
HEMOGLOBIN: 11.6 g/dL — AB (ref 12.0–15.0)
LYMPHS ABS: 1.9 10*3/uL (ref 0.7–4.0)
Lymphocytes Relative: 29 % (ref 12–46)
MCH: 27.8 pg (ref 26.0–34.0)
MCHC: 31.2 g/dL (ref 30.0–36.0)
MCV: 89.2 fL (ref 78.0–100.0)
MONOS PCT: 6 % (ref 3–12)
Monocytes Absolute: 0.4 10*3/uL (ref 0.1–1.0)
NEUTROS PCT: 64 % (ref 43–77)
Neutro Abs: 4.2 10*3/uL (ref 1.7–7.7)
Platelets: 237 10*3/uL (ref 150–400)
RBC: 4.17 MIL/uL (ref 3.87–5.11)
RDW: 16.9 % — ABNORMAL HIGH (ref 11.5–15.5)
WBC: 6.5 10*3/uL (ref 4.0–10.5)

## 2014-12-12 LAB — URINALYSIS, ROUTINE W REFLEX MICROSCOPIC
Bilirubin Urine: NEGATIVE
Glucose, UA: NEGATIVE mg/dL
Hgb urine dipstick: NEGATIVE
Ketones, ur: NEGATIVE mg/dL
Nitrite: NEGATIVE
PH: 6.5 (ref 5.0–8.0)
Protein, ur: NEGATIVE mg/dL
SPECIFIC GRAVITY, URINE: 1.022 (ref 1.005–1.030)
UROBILINOGEN UA: 1 mg/dL (ref 0.0–1.0)

## 2014-12-12 LAB — COMPREHENSIVE METABOLIC PANEL
ALBUMIN: 3.5 g/dL (ref 3.5–5.2)
ALK PHOS: 74 U/L (ref 39–117)
ALT: 11 U/L (ref 0–35)
AST: 16 U/L (ref 0–37)
Anion gap: 9 (ref 5–15)
BUN: 19 mg/dL (ref 6–23)
CALCIUM: 9.2 mg/dL (ref 8.4–10.5)
CHLORIDE: 110 mmol/L (ref 96–112)
CO2: 23 mmol/L (ref 19–32)
CREATININE: 1.05 mg/dL (ref 0.50–1.10)
GFR calc Af Amer: 58 mL/min — ABNORMAL LOW (ref >90)
GFR, EST NON AFRICAN AMERICAN: 50 mL/min — AB (ref >90)
Glucose, Bld: 92 mg/dL (ref 70–99)
Potassium: 4.2 mmol/L (ref 3.5–5.1)
SODIUM: 142 mmol/L (ref 135–145)
Total Bilirubin: 0.4 mg/dL (ref 0.3–1.2)
Total Protein: 6.4 g/dL (ref 6.0–8.3)

## 2014-12-12 LAB — URINE MICROSCOPIC-ADD ON

## 2014-12-12 LAB — TYPE AND SCREEN
ABO/RH(D): A NEG
Antibody Screen: NEGATIVE

## 2014-12-12 LAB — PROTIME-INR
INR: 1.04 (ref 0.00–1.49)
Prothrombin Time: 13.7 seconds (ref 11.6–15.2)

## 2014-12-12 LAB — SURGICAL PCR SCREEN
MRSA, PCR: NEGATIVE
STAPHYLOCOCCUS AUREUS: NEGATIVE

## 2014-12-12 LAB — APTT: APTT: 32 s (ref 24–37)

## 2014-12-12 MED ORDER — CHLORHEXIDINE GLUCONATE 4 % EX LIQD: 60.0000 mL | Freq: Once | CUTANEOUS | Status: DC

## 2014-12-12 MED ORDER — POVIDONE-IODINE 7.5 % EX SOLN: Freq: Once | CUTANEOUS | Status: DC

## 2014-12-12 NOTE — Progress Notes (Addendum)
Cardiologist is Dr.McDowell-clearance note in epic from 11-28-14  EKG in epic from 11-28-14  CXR in epic from 04-11-14  Stress test in epic from 2015  [facility]  Echo and heart cath done > 10 yrs ago(was done d/t panic attack)

## 2014-12-12 NOTE — Pre-Procedure Instructions (Signed)
Renee Dudley  12/12/2014   Your procedure is scheduled on:  Mon, April 11 @ 11:00 AM  Report to [facility]  at 9:00 AM.  Call this number if you have problems the morning of surgery: [phone]   Remember:   Do not eat food or drink liquids after midnight.   Take these medicines the morning of surgery with A SIP OF WATER: Alprazolam(Xanax),Atenolol(Tenormin),Nexium(Esomeprazole),Pain Pill(if needed),and Topamax(Topiramate)               Stop taking your Aspirin. No Goody's,BC's,Aleve,Ibuprofen,Fish Oil,or any Herbal Medications.    Do not wear jewelry, make-up or nail polish.  Do not wear lotions, powders, or perfumes. You may wear deodorant.  Do not shave 48 hours prior to surgery.   Do not bring valuables to the hospital.  [facility] is not responsible                  for any belongings or valuables.               Contacts, dentures or bridgework may not be worn into surgery.  Leave suitcase in the car. After surgery it may be brought to your room.  For patients admitted to the hospital, discharge time is determined by your                treatment team.                   Special Instructions:  [facility] - Preparing for Surgery  Before surgery, you can play an important role.  Because skin is not sterile, your skin needs to be as free of germs as possible.  You can reduce the number of germs on you skin by washing with CHG (chlorahexidine gluconate) soap before surgery.  CHG is an antiseptic cleaner which kills germs and bonds with the skin to continue killing germs even after washing.  Please DO NOT use if you have an allergy to CHG or antibacterial soaps.  If your skin becomes reddened/irritated stop using the CHG and inform your nurse when you arrive at Short Stay.  Do not shave (including legs and underarms) for at least 48 hours prior to the first CHG shower.  You may shave your face.  Please follow these instructions carefully:   1.  Shower with CHG  Soap the night before surgery and the                                morning of Surgery.  2.  If you choose to wash your hair, wash your hair first as usual with your       normal shampoo.  3.  After you shampoo, rinse your hair and body thoroughly to remove the                      Shampoo.  4.  Use CHG as you would any other liquid soap.  You can apply chg directly       to the skin and wash gently with scrungie or a clean washcloth.  5.  Apply the CHG Soap to your body ONLY FROM THE NECK DOWN.        Do not use on open wounds or open sores.  Avoid contact with your eyes,       ears, mouth and genitals (private parts).  Wash genitals (private parts)  with your normal soap.  6.  Wash thoroughly, paying special attention to the area where your surgery        will be performed.  7.  Thoroughly rinse your body with warm water from the neck down.  8.  DO NOT shower/wash with your normal soap after using and rinsing off       the CHG Soap.  9.  Pat yourself dry with a clean towel.            10.  Wear clean pajamas.            11.  Place clean sheets on your bed the night of your first shower and do not        sleep with pets.  Day of Surgery  Do not apply any lotions/deoderants the morning of surgery.  Please wear clean clothes to the hospital/surgery center.     Please read over the following fact sheets that you were given: Pain Booklet, Coughing and Deep Breathing, Blood Transfusion Information, MRSA Information and Surgical Site Infection Prevention

## 2014-12-14 LAB — URINE CULTURE
COLONY COUNT: NO GROWTH
CULTURE: NO GROWTH

## 2014-12-22 MED ORDER — CEFAZOLIN SODIUM-DEXTROSE 2-3 GM-% IV SOLR
2.0000 g | INTRAVENOUS | Status: AC
  Administered 2014-12-23: 3 g via INTRAVENOUS
  Filled 2014-12-22: qty 50

## 2014-12-23 ENCOUNTER — Encounter (HOSPITAL_COMMUNITY)
Admission: RE | Disposition: A | Discharge status: Home Health | Payer: Self-pay | Source: Ambulatory Visit | Attending: Orthopedic Surgery

## 2014-12-23 ENCOUNTER — Inpatient Hospital Stay (HOSPITAL_COMMUNITY): Payer: Medicare Other | Admitting: Vascular Surgery

## 2014-12-23 ENCOUNTER — Encounter (HOSPITAL_COMMUNITY): Payer: Self-pay | Admitting: Certified Registered Nurse Anesthetist

## 2014-12-23 ENCOUNTER — Inpatient Hospital Stay (HOSPITAL_COMMUNITY)
Admission: RE | Admit: 2014-12-23 | Discharge: 2014-12-25 | DRG: 470 | Disposition: A | Discharge status: Home Health | Payer: Medicare Other | Source: Ambulatory Visit | Attending: Orthopedic Surgery | Admitting: Orthopedic Surgery

## 2014-12-23 DIAGNOSIS — Z79899 Other long term (current) drug therapy: Secondary | ICD-10-CM

## 2014-12-23 DIAGNOSIS — I10 Essential (primary) hypertension: Secondary | ICD-10-CM | POA: Diagnosis present

## 2014-12-23 DIAGNOSIS — Z881 Allergy status to other antibiotic agents status: Secondary | ICD-10-CM

## 2014-12-23 DIAGNOSIS — M1712 Unilateral primary osteoarthritis, left knee: Secondary | ICD-10-CM | POA: Diagnosis present

## 2014-12-23 DIAGNOSIS — F329 Major depressive disorder, single episode, unspecified: Secondary | ICD-10-CM | POA: Diagnosis present

## 2014-12-23 DIAGNOSIS — R600 Localized edema: Secondary | ICD-10-CM | POA: Diagnosis present

## 2014-12-23 DIAGNOSIS — R51 Headache: Secondary | ICD-10-CM | POA: Diagnosis present

## 2014-12-23 DIAGNOSIS — R42 Dizziness and giddiness: Secondary | ICD-10-CM | POA: Diagnosis not present

## 2014-12-23 DIAGNOSIS — K219 Gastro-esophageal reflux disease without esophagitis: Secondary | ICD-10-CM | POA: Diagnosis present

## 2014-12-23 DIAGNOSIS — Z7901 Long term (current) use of anticoagulants: Secondary | ICD-10-CM

## 2014-12-23 DIAGNOSIS — Z888 Allergy status to other drugs, medicaments and biological substances status: Secondary | ICD-10-CM | POA: Diagnosis not present

## 2014-12-23 DIAGNOSIS — Z880 Allergy status to penicillin: Secondary | ICD-10-CM

## 2014-12-23 DIAGNOSIS — N390 Urinary tract infection, site not specified: Secondary | ICD-10-CM | POA: Diagnosis present

## 2014-12-23 DIAGNOSIS — K5900 Constipation, unspecified: Secondary | ICD-10-CM | POA: Diagnosis present

## 2014-12-23 DIAGNOSIS — Z823 Family history of stroke: Secondary | ICD-10-CM | POA: Diagnosis not present

## 2014-12-23 DIAGNOSIS — Z6838 Body mass index (BMI) 38.0-38.9, adult: Secondary | ICD-10-CM | POA: Diagnosis not present

## 2014-12-23 DIAGNOSIS — E782 Mixed hyperlipidemia: Secondary | ICD-10-CM | POA: Diagnosis present

## 2014-12-23 DIAGNOSIS — F419 Anxiety disorder, unspecified: Secondary | ICD-10-CM | POA: Diagnosis present

## 2014-12-23 DIAGNOSIS — Z7952 Long term (current) use of systemic steroids: Secondary | ICD-10-CM | POA: Diagnosis not present

## 2014-12-23 DIAGNOSIS — M179 Osteoarthritis of knee, unspecified: Secondary | ICD-10-CM | POA: Diagnosis present

## 2014-12-23 DIAGNOSIS — M1710 Unilateral primary osteoarthritis, unspecified knee: Secondary | ICD-10-CM | POA: Diagnosis present

## 2014-12-23 DIAGNOSIS — R0989 Other specified symptoms and signs involving the circulatory and respiratory systems: Secondary | ICD-10-CM | POA: Diagnosis present

## 2014-12-23 DIAGNOSIS — I2510 Atherosclerotic heart disease of native coronary artery without angina pectoris: Secondary | ICD-10-CM | POA: Diagnosis present

## 2014-12-23 DIAGNOSIS — M25562 Pain in left knee: Secondary | ICD-10-CM | POA: Diagnosis present

## 2014-12-23 HISTORY — DX: Unilateral primary osteoarthritis, left knee: M17.12

## 2014-12-23 HISTORY — PX: TOTAL KNEE ARTHROPLASTY: SHX125

## 2014-12-23 LAB — CBC
HCT: 34.9 % — ABNORMAL LOW (ref 36.0–46.0)
Hemoglobin: 10.8 g/dL — ABNORMAL LOW (ref 12.0–15.0)
MCH: 27.8 pg (ref 26.0–34.0)
MCHC: 30.9 g/dL (ref 30.0–36.0)
MCV: 89.9 fL (ref 78.0–100.0)
Platelets: 213 10*3/uL (ref 150–400)
RBC: 3.88 MIL/uL (ref 3.87–5.11)
RDW: 16.8 % — ABNORMAL HIGH (ref 11.5–15.5)
WBC: 12 10*3/uL — AB (ref 4.0–10.5)

## 2014-12-23 LAB — CREATININE, SERUM
CREATININE: 1.02 mg/dL (ref 0.50–1.10)
GFR calc non Af Amer: 52 mL/min — ABNORMAL LOW (ref >90)
GFR, EST AFRICAN AMERICAN: 60 mL/min — AB (ref >90)

## 2014-12-23 SURGERY — ARTHROPLASTY, KNEE, TOTAL
Anesthesia: Regional | Site: Knee | Laterality: Left

## 2014-12-23 MED ORDER — PANTOPRAZOLE SODIUM 40 MG PO TBEC
40.0000 mg | DELAYED_RELEASE_TABLET | Freq: Every day | ORAL | Status: DC
  Administered 2014-12-24: 40 mg via ORAL
  Filled 2014-12-23 (×2): qty 1

## 2014-12-23 MED ORDER — NEOSTIGMINE METHYLSULFATE 10 MG/10ML IV SOLN
INTRAVENOUS | Status: DC | PRN
  Administered 2014-12-23: 5 mg via INTRAVENOUS

## 2014-12-23 MED ORDER — HYDROMORPHONE HCL 1 MG/ML IJ SOLN
1.0000 mg | INTRAMUSCULAR | Status: DC | PRN
  Administered 2014-12-23: 1 mg via INTRAVENOUS
  Filled 2014-12-23: qty 1

## 2014-12-23 MED ORDER — ACETAMINOPHEN 325 MG PO TABS: 650.0000 mg | ORAL_TABLET | Freq: Four times a day (QID) | ORAL | Status: DC | PRN

## 2014-12-23 MED ORDER — CEFUROXIME SODIUM 1.5 G IJ SOLR
INTRAMUSCULAR | Status: AC
  Filled 2014-12-23: qty 1.5

## 2014-12-23 MED ORDER — DOCUSATE SODIUM 100 MG PO CAPS: 100.0000 mg | ORAL_CAPSULE | Freq: Two times a day (BID) | ORAL | Status: DC

## 2014-12-23 MED ORDER — MIDAZOLAM HCL 2 MG/2ML IJ SOLN
INTRAMUSCULAR | Status: AC
  Filled 2014-12-23: qty 2

## 2014-12-23 MED ORDER — TOPIRAMATE 25 MG PO TABS
50.0000 mg | ORAL_TABLET | Freq: Two times a day (BID) | ORAL | Status: DC
  Administered 2014-12-24 – 2014-12-25 (×3): 50 mg via ORAL
  Filled 2014-12-23 (×4): qty 2

## 2014-12-23 MED ORDER — HYDROMORPHONE HCL 1 MG/ML IJ SOLN
INTRAMUSCULAR | Status: AC
  Administered 2014-12-23: 0.5 mg via INTRAVENOUS
  Filled 2014-12-23: qty 2

## 2014-12-23 MED ORDER — ONDANSETRON HCL 4 MG PO TABS: 4.0000 mg | ORAL_TABLET | Freq: Four times a day (QID) | ORAL | Status: DC | PRN

## 2014-12-23 MED ORDER — LACTATED RINGERS IV SOLN
INTRAVENOUS | Status: DC
  Administered 2014-12-23: 10 mL/h via INTRAVENOUS

## 2014-12-23 MED ORDER — METOCLOPRAMIDE HCL 5 MG PO TABS: 5.0000 mg | ORAL_TABLET | Freq: Three times a day (TID) | ORAL | Status: DC | PRN

## 2014-12-23 MED ORDER — FENTANYL CITRATE 0.05 MG/ML IJ SOLN
INTRAMUSCULAR | Status: AC
  Filled 2014-12-23: qty 5

## 2014-12-23 MED ORDER — METOCLOPRAMIDE HCL 5 MG/ML IJ SOLN: 5.0000 mg | Freq: Three times a day (TID) | INTRAMUSCULAR | Status: DC | PRN

## 2014-12-23 MED ORDER — PHENYLEPHRINE 40 MCG/ML (10ML) SYRINGE FOR IV PUSH (FOR BLOOD PRESSURE SUPPORT)
PREFILLED_SYRINGE | INTRAVENOUS | Status: AC
  Filled 2014-12-23: qty 20

## 2014-12-23 MED ORDER — BUPIVACAINE-EPINEPHRINE 0.25% -1:200000 IJ SOLN
INTRAMUSCULAR | Status: DC | PRN
  Administered 2014-12-23: 30 mL

## 2014-12-23 MED ORDER — CELECOXIB 200 MG PO CAPS
200.0000 mg | ORAL_CAPSULE | Freq: Two times a day (BID) | ORAL | Status: DC
Start: 2014-12-23 — End: 2014-12-25
  Administered 2014-12-23 – 2014-12-25 (×5): 200 mg via ORAL
  Filled 2014-12-23 (×5): qty 1

## 2014-12-23 MED ORDER — EPHEDRINE SULFATE 50 MG/ML IJ SOLN
INTRAMUSCULAR | Status: AC
  Filled 2014-12-23: qty 1

## 2014-12-23 MED ORDER — SODIUM CHLORIDE 0.9 % IR SOLN
Status: DC | PRN
  Administered 2014-12-23: 1000 mL
  Administered 2014-12-23: 3000 mL

## 2014-12-23 MED ORDER — ROCURONIUM BROMIDE 100 MG/10ML IV SOLN
INTRAVENOUS | Status: DC | PRN
  Administered 2014-12-23: 30 mg via INTRAVENOUS

## 2014-12-23 MED ORDER — BUPIVACAINE-EPINEPHRINE (PF) 0.25% -1:200000 IJ SOLN
INTRAMUSCULAR | Status: AC
  Filled 2014-12-23: qty 30

## 2014-12-23 MED ORDER — LACTATED RINGERS IV SOLN
INTRAVENOUS | Status: DC | PRN
  Administered 2014-12-23 (×2): via INTRAVENOUS

## 2014-12-23 MED ORDER — PROPOFOL 10 MG/ML IV BOLUS
INTRAVENOUS | Status: DC | PRN
  Administered 2014-12-23 (×2): 100 mg via INTRAVENOUS

## 2014-12-23 MED ORDER — ROCURONIUM BROMIDE 50 MG/5ML IV SOLN
INTRAVENOUS | Status: AC
  Filled 2014-12-23: qty 1

## 2014-12-23 MED ORDER — CEFUROXIME SODIUM 1.5 G IJ SOLR
INTRAMUSCULAR | Status: DC | PRN
  Administered 2014-12-23: 1.5 g

## 2014-12-23 MED ORDER — OXYCODONE HCL 5 MG PO TABS: 5.0000 mg | ORAL_TABLET | Freq: Once | ORAL | Status: DC | PRN

## 2014-12-23 MED ORDER — DOCUSATE SODIUM 100 MG PO CAPS
400.0000 mg | ORAL_CAPSULE | Freq: Every day | ORAL | Status: DC
  Administered 2014-12-23 – 2014-12-24 (×2): 400 mg via ORAL
  Filled 2014-12-23 (×2): qty 4

## 2014-12-23 MED ORDER — GLYCOPYRROLATE 0.2 MG/ML IJ SOLN
INTRAMUSCULAR | Status: AC
  Filled 2014-12-23: qty 5

## 2014-12-23 MED ORDER — EPHEDRINE SULFATE 50 MG/ML IJ SOLN
INTRAMUSCULAR | Status: DC | PRN
  Administered 2014-12-23: 10 mg via INTRAVENOUS

## 2014-12-23 MED ORDER — OXYCODONE HCL 5 MG PO TABS
5.0000 mg | ORAL_TABLET | ORAL | Status: DC | PRN
  Administered 2014-12-23: 5 mg via ORAL
  Administered 2014-12-23 – 2014-12-25 (×6): 10 mg via ORAL
  Filled 2014-12-23 (×7): qty 2

## 2014-12-23 MED ORDER — OXYCODONE HCL 5 MG/5ML PO SOLN: 5.0000 mg | Freq: Once | ORAL | Status: DC | PRN

## 2014-12-23 MED ORDER — SERTRALINE HCL 25 MG PO TABS
25.0000 mg | ORAL_TABLET | Freq: Every day | ORAL | Status: DC
  Administered 2014-12-23 – 2014-12-24 (×2): 25 mg via ORAL
  Filled 2014-12-23 (×2): qty 1

## 2014-12-23 MED ORDER — ONDANSETRON HCL 4 MG/2ML IJ SOLN
INTRAMUSCULAR | Status: DC | PRN
  Administered 2014-12-23: 4 mg via INTRAVENOUS

## 2014-12-23 MED ORDER — OXYCODONE HCL 5 MG PO TABS
ORAL_TABLET | ORAL | Status: AC
  Filled 2014-12-23: qty 2

## 2014-12-23 MED ORDER — FUROSEMIDE 40 MG PO TABS
40.0000 mg | ORAL_TABLET | Freq: Every day | ORAL | Status: DC
  Administered 2014-12-25: 40 mg via ORAL
  Filled 2014-12-23: qty 1

## 2014-12-23 MED ORDER — DEXAMETHASONE SODIUM PHOSPHATE 4 MG/ML IJ SOLN
INTRAMUSCULAR | Status: DC | PRN
  Administered 2014-12-23: 10 mg via INTRAVENOUS

## 2014-12-23 MED ORDER — PHENOL 1.4 % MT LIQD: 1.0000 | OROMUCOSAL | Status: DC | PRN

## 2014-12-23 MED ORDER — BUPIVACAINE-EPINEPHRINE (PF) 0.5% -1:200000 IJ SOLN
INTRAMUSCULAR | Status: DC | PRN
  Administered 2014-12-23: 25 mL via PERINEURAL

## 2014-12-23 MED ORDER — SUCCINYLCHOLINE CHLORIDE 20 MG/ML IJ SOLN
INTRAMUSCULAR | Status: AC
  Filled 2014-12-23: qty 1

## 2014-12-23 MED ORDER — CEFAZOLIN SODIUM-DEXTROSE 2-3 GM-% IV SOLR
2.0000 g | Freq: Four times a day (QID) | INTRAVENOUS | Status: AC
  Administered 2014-12-23: 2 g via INTRAVENOUS
  Filled 2014-12-23 (×2): qty 50

## 2014-12-23 MED ORDER — ALPRAZOLAM 0.5 MG PO TABS
0.5000 mg | ORAL_TABLET | Freq: Every day | ORAL | Status: DC | PRN
  Administered 2014-12-24: 0.5 mg via ORAL
  Filled 2014-12-23: qty 1

## 2014-12-23 MED ORDER — FENTANYL CITRATE 0.05 MG/ML IJ SOLN
INTRAMUSCULAR | Status: AC
  Administered 2014-12-23: 50 ug
  Filled 2014-12-23: qty 2

## 2014-12-23 MED ORDER — POTASSIUM CHLORIDE IN NACL 20-0.9 MEQ/L-% IV SOLN
INTRAVENOUS | Status: DC
  Administered 2014-12-23: 23:00:00 via INTRAVENOUS
  Filled 2014-12-23 (×6): qty 1000

## 2014-12-23 MED ORDER — HYDROMORPHONE HCL 1 MG/ML IJ SOLN
0.2500 mg | INTRAMUSCULAR | Status: DC | PRN
  Administered 2014-12-23 (×3): 0.5 mg via INTRAVENOUS

## 2014-12-23 MED ORDER — SUCCINYLCHOLINE CHLORIDE 20 MG/ML IJ SOLN
INTRAMUSCULAR | Status: DC | PRN
  Administered 2014-12-23: 120 mg via INTRAVENOUS

## 2014-12-23 MED ORDER — ATENOLOL 50 MG PO TABS
50.0000 mg | ORAL_TABLET | Freq: Every day | ORAL | Status: DC
  Administered 2014-12-24 – 2014-12-25 (×2): 50 mg via ORAL
  Filled 2014-12-23 (×2): qty 1

## 2014-12-23 MED ORDER — PHENYLEPHRINE HCL 10 MG/ML IJ SOLN
INTRAMUSCULAR | Status: DC | PRN
  Administered 2014-12-23: 80 ug via INTRAVENOUS

## 2014-12-23 MED ORDER — ALUM & MAG HYDROXIDE-SIMETH 200-200-20 MG/5ML PO SUSP: 30.0000 mL | ORAL | Status: DC | PRN

## 2014-12-23 MED ORDER — FENTANYL CITRATE 0.05 MG/ML IJ SOLN
INTRAMUSCULAR | Status: DC | PRN
  Administered 2014-12-23: 100 ug via INTRAVENOUS
  Administered 2014-12-23 (×2): 50 ug via INTRAVENOUS
  Administered 2014-12-23: 100 ug via INTRAVENOUS

## 2014-12-23 MED ORDER — ACETAMINOPHEN 650 MG RE SUPP: 650.0000 mg | Freq: Four times a day (QID) | RECTAL | Status: DC | PRN

## 2014-12-23 MED ORDER — PROMETHAZINE HCL 25 MG/ML IJ SOLN: 6.2500 mg | INTRAMUSCULAR | Status: DC | PRN

## 2014-12-23 MED ORDER — POLYETHYLENE GLYCOL 3350 17 G PO PACK
17.0000 g | PACK | Freq: Two times a day (BID) | ORAL | Status: DC
  Administered 2014-12-24 (×2): 17 g via ORAL
  Filled 2014-12-23 (×4): qty 1

## 2014-12-23 MED ORDER — ENOXAPARIN SODIUM 30 MG/0.3ML ~~LOC~~ SOLN
30.0000 mg | SUBCUTANEOUS | Status: DC
  Administered 2014-12-24 – 2014-12-25 (×2): 30 mg via SUBCUTANEOUS
  Filled 2014-12-23 (×2): qty 0.3

## 2014-12-23 MED ORDER — DIPHENHYDRAMINE HCL 12.5 MG/5ML PO ELIX: 12.5000 mg | ORAL_SOLUTION | ORAL | Status: DC | PRN

## 2014-12-23 MED ORDER — DEXAMETHASONE SODIUM PHOSPHATE 10 MG/ML IJ SOLN
10.0000 mg | Freq: Three times a day (TID) | INTRAMUSCULAR | Status: DC
  Administered 2014-12-23 – 2014-12-25 (×5): 10 mg via INTRAVENOUS
  Filled 2014-12-23 (×5): qty 1

## 2014-12-23 MED ORDER — MENTHOL 3 MG MT LOZG: 1.0000 | LOZENGE | OROMUCOSAL | Status: DC | PRN

## 2014-12-23 MED ORDER — DEXAMETHASONE SODIUM PHOSPHATE 10 MG/ML IJ SOLN
INTRAMUSCULAR | Status: AC
  Filled 2014-12-23: qty 2

## 2014-12-23 MED ORDER — MIDAZOLAM HCL 2 MG/2ML IJ SOLN
INTRAMUSCULAR | Status: AC
  Administered 2014-12-23: 2 mg
  Filled 2014-12-23: qty 2

## 2014-12-23 MED ORDER — ONDANSETRON HCL 4 MG/2ML IJ SOLN: 4.0000 mg | Freq: Four times a day (QID) | INTRAMUSCULAR | Status: DC | PRN

## 2014-12-23 MED ORDER — GLYCOPYRROLATE 0.2 MG/ML IJ SOLN
INTRAMUSCULAR | Status: DC | PRN
  Administered 2014-12-23: 0.1 mg via INTRAVENOUS
  Administered 2014-12-23: .8 mg via INTRAVENOUS
  Administered 2014-12-23: 0.1 mg via INTRAVENOUS

## 2014-12-23 MED ORDER — ONDANSETRON HCL 4 MG/2ML IJ SOLN
INTRAMUSCULAR | Status: AC
  Filled 2014-12-23: qty 2

## 2014-12-23 SURGICAL SUPPLY — 70 items
APL SKNCLS STERI-STRIP NONHPOA (GAUZE/BANDAGES/DRESSINGS) ×1
BANDAGE ESMARK 6X9 LF (GAUZE/BANDAGES/DRESSINGS) ×1 IMPLANT
BENZOIN TINCTURE PRP APPL 2/3 (GAUZE/BANDAGES/DRESSINGS) ×2 IMPLANT
BLADE SAGITTAL 25.0X1.19X90 (BLADE) ×2 IMPLANT
BLADE SAW SGTL 11.0X1.19X90.0M (BLADE) IMPLANT
BLADE SAW SGTL 13.0X1.19X90.0M (BLADE) ×2 IMPLANT
BLADE SURG 10 STRL SS (BLADE) ×4 IMPLANT
BNDG CMPR 9X6 STRL LF SNTH (GAUZE/BANDAGES/DRESSINGS) ×1
BNDG CMPR MED 15X6 ELC VLCR LF (GAUZE/BANDAGES/DRESSINGS) ×1
BNDG ELASTIC 6X15 VLCR STRL LF (GAUZE/BANDAGES/DRESSINGS) ×2 IMPLANT
BNDG ESMARK 6X9 LF (GAUZE/BANDAGES/DRESSINGS) ×2
BOWL SMART MIX CTS (DISPOSABLE) ×2 IMPLANT
CAP KNEE TOTAL 3 SIGMA ×1 IMPLANT
CEMENT HV SMART SET (Cement) ×4 IMPLANT
COVER SURGICAL LIGHT HANDLE (MISCELLANEOUS) ×2 IMPLANT
CUFF TOURNIQUET SINGLE 34IN LL (TOURNIQUET CUFF) ×2 IMPLANT
CUFF TOURNIQUET SINGLE 44IN (TOURNIQUET CUFF) IMPLANT
DRAPE EXTREMITY T 121X128X90 (DRAPE) ×2 IMPLANT
DRAPE IMP U-DRAPE 54X76 (DRAPES) ×2 IMPLANT
DRAPE INCISE IOBAN 66X45 STRL (DRAPES) ×2 IMPLANT
DRAPE PROXIMA HALF (DRAPES) ×3 IMPLANT
DRAPE U-SHAPE 47X51 STRL (DRAPES) ×2 IMPLANT
DRSG AQUACEL AG ADV 3.5X14 (GAUZE/BANDAGES/DRESSINGS) ×2 IMPLANT
DRSG PAD ABDOMINAL 8X10 ST (GAUZE/BANDAGES/DRESSINGS) ×4 IMPLANT
DURAPREP 26ML APPLICATOR (WOUND CARE) ×4 IMPLANT
ELECT CAUTERY BLADE 6.4 (BLADE) ×2 IMPLANT
ELECT REM PT RETURN 9FT ADLT (ELECTROSURGICAL) ×2
ELECTRODE REM PT RTRN 9FT ADLT (ELECTROSURGICAL) ×1 IMPLANT
EVACUATOR 1/8 PVC DRAIN (DRAIN) ×2 IMPLANT
FACESHIELD WRAPAROUND (MASK) ×2 IMPLANT
FACESHIELD WRAPAROUND OR TEAM (MASK) ×1 IMPLANT
GAUZE SPONGE 4X4 12PLY STRL (GAUZE/BANDAGES/DRESSINGS) ×2 IMPLANT
GLOVE BIO SURGEON STRL SZ7 (GLOVE) ×2 IMPLANT
GLOVE BIOGEL PI IND STRL 7.0 (GLOVE) ×1 IMPLANT
GLOVE BIOGEL PI IND STRL 7.5 (GLOVE) ×1 IMPLANT
GLOVE BIOGEL PI INDICATOR 7.0 (GLOVE) ×1
GLOVE BIOGEL PI INDICATOR 7.5 (GLOVE) ×1
GLOVE SS BIOGEL STRL SZ 7.5 (GLOVE) ×1 IMPLANT
GLOVE SUPERSENSE BIOGEL SZ 7.5 (GLOVE) ×1
GOWN STRL REUS W/ TWL LRG LVL3 (GOWN DISPOSABLE) ×2 IMPLANT
GOWN STRL REUS W/ TWL XL LVL3 (GOWN DISPOSABLE) ×2 IMPLANT
GOWN STRL REUS W/TWL LRG LVL3 (GOWN DISPOSABLE) ×4
GOWN STRL REUS W/TWL XL LVL3 (GOWN DISPOSABLE) ×4
HANDPIECE INTERPULSE COAX TIP (DISPOSABLE) ×2
HOOD PEEL AWAY FACE SHEILD DIS (HOOD) ×5 IMPLANT
IMMOBILIZER KNEE 22 UNIV (SOFTGOODS) IMPLANT
KIT BASIN OR (CUSTOM PROCEDURE TRAY) ×2 IMPLANT
KIT ROOM TURNOVER OR (KITS) ×2 IMPLANT
MANIFOLD NEPTUNE II (INSTRUMENTS) ×2 IMPLANT
MARKER SKIN DUAL TIP RULER LAB (MISCELLANEOUS) ×2 IMPLANT
NS IRRIG 1000ML POUR BTL (IV SOLUTION) ×2 IMPLANT
PACK TOTAL JOINT (CUSTOM PROCEDURE TRAY) ×2 IMPLANT
PACK UNIVERSAL I (CUSTOM PROCEDURE TRAY) ×2 IMPLANT
PAD ARMBOARD 7.5X6 YLW CONV (MISCELLANEOUS) ×4 IMPLANT
PADDING CAST COTTON 6X4 STRL (CAST SUPPLIES) ×2 IMPLANT
RUBBERBAND STERILE (MISCELLANEOUS) ×2 IMPLANT
SET HNDPC FAN SPRY TIP SCT (DISPOSABLE) ×1 IMPLANT
STRIP CLOSURE SKIN 1/2X4 (GAUZE/BANDAGES/DRESSINGS) ×2 IMPLANT
SUCTION FRAZIER TIP 10 FR DISP (SUCTIONS) ×2 IMPLANT
SUT ETHIBOND NAB CT1 #1 30IN (SUTURE) ×3 IMPLANT
SUT MNCRL AB 3-0 PS2 18 (SUTURE) ×2 IMPLANT
SUT VIC AB 0 CT1 27 (SUTURE) ×8
SUT VIC AB 0 CT1 27XBRD ANBCTR (SUTURE) ×2 IMPLANT
SUT VIC AB 2-0 CT1 27 (SUTURE) ×4
SUT VIC AB 2-0 CT1 TAPERPNT 27 (SUTURE) ×2 IMPLANT
SYR 30ML SLIP (SYRINGE) ×2 IMPLANT
TOWEL OR 17X24 6PK STRL BLUE (TOWEL DISPOSABLE) ×2 IMPLANT
TOWEL OR 17X26 10 PK STRL BLUE (TOWEL DISPOSABLE) ×2 IMPLANT
TRAY FOLEY CATH 16FR SILVER (SET/KITS/TRAYS/PACK) ×2 IMPLANT
WATER STERILE IRR 1000ML POUR (IV SOLUTION) ×2 IMPLANT

## 2014-12-23 NOTE — Anesthesia Preprocedure Evaluation (Addendum)
Anesthesia Evaluation  Patient identified by MRN, date of birth, ID band Patient awake    Reviewed: Allergy & Precautions, NPO status , Patient's Chart, lab work & pertinent test results, reviewed documented beta blocker date and time   Airway Mallampati: II  TM Distance: >3 FB Neck ROM: Full    Dental  (+) Teeth Intact, Dental Advisory Given   Pulmonary neg pulmonary ROS,  breath sounds clear to auscultation        Cardiovascular hypertension, Pt. on medications and Pt. on home beta blockers + CAD Rhythm:Regular Rate:Normal  Negative stress test 10/2013  Mild CAD (20-30%) on 2007 LHC   Neuro/Psych  Headaches, Anxiety Depression    GI/Hepatic Neg liver ROS, GERD-  ,  Endo/Other  Morbid obesity  Renal/GU Renal InsufficiencyRenal disease     Musculoskeletal  (+) Arthritis -,   Abdominal   Peds  Hematology  (+) anemia , Hgb 11.6   Anesthesia Other Findings   Reproductive/Obstetrics                            Anesthesia Physical Anesthesia Plan  ASA: III  Anesthesia Plan: General and Regional   Post-op Pain Management:    Induction: Intravenous  Airway Management Planned: Oral ETT  Additional Equipment:   Intra-op Plan:   Post-operative Plan: Extubation in OR  Informed Consent: I have reviewed the patients History and Physical, chart, labs and discussed the procedure including the risks, benefits and alternatives for the proposed anesthesia with the patient or authorized representative who has indicated his/her understanding and acceptance.   Dental advisory given  Plan Discussed with: CRNA  Anesthesia Plan Comments:        Anesthesia Quick Evaluation

## 2014-12-23 NOTE — Progress Notes (Signed)
Utilization review completed.  

## 2014-12-23 NOTE — Progress Notes (Signed)
Orthopedic Tech Progress Note Patient Details:  Renee Dudley 11-Jun-1936 [id number]  Ortho Devices Ortho Device/Splint Location: footsie roll at bedside  Ortho Device/Splint Interventions: Criss Alvine 12/23/2014, 7:03 PM

## 2014-12-23 NOTE — Op Note (Signed)
MRN:     [id number] DOB/AGE:    August 18, 1936 / 79 y.o.       OPERATIVE REPORT    DATE OF PROCEDURE:  12/23/2014       PREOPERATIVE DIAGNOSIS:   Primary localized OA  left knee      Estimated body mass index is 38.54 kg/(m^2) as calculated from the following:   Height as of this encounter: 5\' 6"  (1.676 m).   Weight as of this encounter: 108.268 kg (238 lb 11 oz).                                                        POSTOPERATIVE DIAGNOSIS:   same                                                                     PROCEDURE:  Procedure(s): LEFT TOTAL KNEE ARTHROPLASTY Using Depuy Sigma RP implants #4 narrow Femur, #4Tibia, 12.66mm sigma RP bearing, 32 Patella     SURGEON: Ashton Belote A    ASSISTANT:  Kirstin Shepperson PA-C   (Present and scrubbed throughout the case, critical for assistance with exposure, retraction, instrumentation, and closure.)         ANESTHESIA: GET with Femoral Nerve Block  DRAINS: foley, 2 medium hemovac in knee   TOURNIQUET TIME: 63WGY   COMPLICATIONS:  None     SPECIMENS: None   INDICATIONS FOR PROCEDURE: The patient has  djd left knee, varus deformities, XR shows bone on bone arthritis. Patient has failed all conservative measures including anti-inflammatory medicines, narcotics, attempts at  exercise and weight loss, cortisone injections and viscosupplementation.  Risks and benefits of surgery have been discussed, questions answered.   DESCRIPTION OF PROCEDURE: The patient identified by armband, received  right femoral nerve block and IV antibiotics, in the holding area at [facility]. Patient taken to the operating room, appropriate anesthetic  monitors were attached General endotracheal anesthesia induced with  the patient in supine position, Foley catheter was inserted. Tourniquet  applied high to the operative thigh. Lateral post and foot positioner  applied to the table, the lower extremity was then prepped and draped  in usual sterile  fashion from the ankle to the tourniquet. Time-out procedure was performed. The limb was wrapped with an Esmarch bandage and the tourniquet inflated to 365 mmHg. We began the operation by making the anterior midline incision starting at handbreadth above the patella going over the patella 1 cm medial to and  4 cm distal to the tibial tubercle. Small bleeders in the skin and the  subcutaneous tissue identified and cauterized. Transverse retinaculum was incised and reflected medially and a medial parapatellar arthrotomy was accomplished. the patella was everted and theprepatellar fat pad resected. The superficial medial collateral  ligament was then elevated from anterior to posterior along the proximal  flare of the tibia and anterior half of the menisci resected. The knee was hyperflexed exposing bone on bone arthritis. Peripheral and notch osteophytes as well as the cruciate ligaments were then resected. We continued to  work our way around posteriorly along the  proximal tibia, and externally  rotated the tibia subluxing it out from underneath the femur. A McHale  retractor was placed through the notch and a lateral Hohmann retractor  placed, and we then drilled through the proximal tibia in line with the  axis of the tibia followed by an intramedullary guide rod and 2-degree  posterior slope cutting guide. The tibial cutting guide was pinned into place  allowing resection of 4 mm of bone medially and about 6 mm of bone  laterally because of her varus deformity. Satisfied with the tibial resection, we then  entered the distal femur 2 mm anterior to the PCL origin with the  intramedullary guide rod and applied the distal femoral cutting guide  set at 73mm, with 5 degrees of valgus. This was pinned along the  epicondylar axis. At this point, the distal femoral cut was accomplished without difficulty. We then sized for a #4 narrow femoral component and pinned the guide in 3 degrees of external  rotation.The chamfer cutting guide was pinned into place. The anterior, posterior, and chamfer cuts were accomplished without difficulty followed by  the Sigma RP box cutting guide and the box cut. We also removed posterior osteophytes from the posterior femoral condyles. At this  time, the knee was brought into full extension. We checked our  extension and flexion gaps and found them symmetric at 12.21mm.  The patella thickness measured at 24 mm. We set the cutting guide at 15 and removed the posterior 9.5-10 mm  of the patella sized for 32 button and drilled the lollipop. The knee  was then once again hyperflexed exposing the proximal tibia. We sized for a #4 tibial base plate, applied the smokestack and the conical reamer followed by the the Delta fin keel punch. We then hammered into place the Sigma RP trial femoral component, inserted a 12.5-mm trial bearing, trial patellar button, and took the knee through range of motion from 0-130 degrees. No thumb pressure was required for patellar  tracking. At this point, all trial components were removed, a double batch of DePuy HV cement with 1500 mg of Zinacef was mixed and applied to all bony metallic mating surfaces except for the posterior condyles of the femur itself. In order, we  hammered into place the tibial tray and removed excess cement, the femoral component and removed excess cement, a 12.5-mm Sigma RP bearing  was inserted, and the knee brought to full extension with compression.  The patellar button was clamped into place, and excess cement  removed. While the cement cured the wound was irrigated out with normal saline solution pulse lavage, and medium Hemovac drains were placed.. Ligament stability and patellar tracking were checked and found to be excellent. The tourniquet was then released and hemostasis was obtained with cautery. The parapatellar arthrotomy was closed with  #1 ethibond suture. The subcutaneous tissue with 0 and 2-0 undyed   Vicryl suture, and 4-0 Monocryl.. A dressing of Xeroform,  4 x 4, dressing sponges, Webril, and Ace wrap applied. Needle and sponge count were correct times 2.The patient awakened, extubated, and taken to recovery room without difficulty. Vascular status was normal, pulses 2+ and symmetric.   Mashell Sieben A 12/23/2014, 12:37 PM

## 2014-12-23 NOTE — Progress Notes (Signed)
Orthopedic Tech Progress Note Patient Details:  Renee Dudley [birth year] [id number]  CPM Left Knee CPM Left Knee: On Left Knee Flexion (Degrees): 90 Left Knee Extension (Degrees): 0 Additional Comments: trapeze bar patient helper Viewed order from doctor's order list; ortho currently has no footsie roll and will provide when it becomes available  Hildred Priest 12/23/2014, 1:55 PM

## 2014-12-23 NOTE — Anesthesia Procedure Notes (Addendum)
Procedure Name: Intubation Date/Time: 12/23/2014 11:13 AM Performed by: Ollen Bowl Pre-anesthesia Checklist: Patient identified, Emergency Drugs available, Suction available, Patient being monitored and Timeout performed Patient Re-evaluated:Patient Re-evaluated prior to inductionOxygen Delivery Method: Circle system utilized and Simple face mask Preoxygenation: Pre-oxygenation with 100% oxygen Intubation Type: IV induction Laryngoscope Size: Mac and 3 Grade View: Grade II Tube type: Oral Tube size: 7.5 mm Number of attempts: 1 Airway Equipment and Method: Patient positioned with wedge pillow and Stylet Placement Confirmation: ETT inserted through vocal cords under direct vision,  CO2 detector and breath sounds checked- equal and bilateral Secured at: 21 cm Tube secured with: Tape Dental Injury: Teeth and Oropharynx as per pre-operative assessment    Anesthesia Regional Block:  Adductor canal block  Pre-Anesthetic Checklist: ,, timeout performed, Correct Patient, Correct Site, Correct Laterality, Correct Procedure, Correct Position, site marked, Risks and benefits discussed,  Surgical consent,  Pre-op evaluation,  At surgeon's request and post-op pain management  Laterality: Left  Prep: chloraprep       Needles:  Injection technique: Single-shot  Needle Type: Echogenic Needle     Needle Length: 9cm 9 cm Needle Gauge: 21 and 21 G    Additional Needles:  Procedures: ultrasound guided (picture in chart) Adductor canal block Narrative:  Start time: 12/23/2014 10:30 AM End time: 12/23/2014 10:40 AM Injection made incrementally with aspirations every 5 mL.  Performed by: Personally  Anesthesiologist: Suzette Battiest  Additional Notes: Risks and benefits explained. Pt tolerated without immediate complications.

## 2014-12-23 NOTE — Evaluation (Signed)
Physical Therapy Evaluation Patient Details Name: Renee Dudley MRN: [id number] DOB: [birth year] Today's Date: 12/23/2014   History of Present Illness  79 y.o. female admitted to [facility] on 12/23/14 for elective L TKA.  Pt with significant PMHx of R TKA,  peripherial edema (takes lasix daily), anxiety/depression, HTN, and gout.  Clinical Impression  Pt is POD #0 and is moving at a min assist level with RW.  She will likely progress well enough to return home with family's 24 hour assist and HHPT at discharge.  PT to follow acutely for deficits listed below.       Follow Up Recommendations Home health PT;Supervision for mobility/OOB    Equipment Recommendations  None recommended by PT    Recommendations for Other Services   NA    Precautions / Restrictions Precautions Precautions: Knee Precaution Booklet Issued: Yes (comment) Precaution Comments: knee handout given, WB status and precaitions reviewed.  Required Braces or Orthoses: Knee Immobilizer - Left Knee Immobilizer - Left: On when out of bed or walking Restrictions Weight Bearing Restrictions: Yes LLE Weight Bearing: Weight bearing as tolerated      Mobility  Bed Mobility Overal bed mobility: Needs Assistance Bed Mobility: Supine to Sit     Supine to sit: Min assist     General bed mobility comments: Min assist to help progress left leg to EOB.   Transfers Overall transfer level: Needs assistance Equipment used: Rolling walker (2 wheeled) Transfers: Sit to/from Stand Sit to Stand: Min assist;From elevated surface         General transfer comment: Min assist from elelvated bed to support trunk and stabilize RW during transitions.   Ambulation/Gait Ambulation/Gait assistance: Min assist Ambulation Distance (Feet): 5 Feet Assistive device: Rolling walker (2 wheeled) Gait Pattern/deviations: Step-to pattern;Antalgic Gait velocity: decreased   General Gait Details: Pt with moderately antalgic gait, no  buckling in KI donned before getting EOB.  Pt able to demonstrate correct leg sequencing and verbal cues needed to stand closer to RW during gait.          Balance Overall balance assessment: Needs assistance Sitting-balance support: Feet supported;No upper extremity supported Sitting balance-Leahy Scale: Good     Standing balance support: Bilateral upper extremity supported Standing balance-Leahy Scale: Poor                               Pertinent Vitals/Pain Pain Assessment: 0-10 Pain Score: 7  Pain Location: left knee Pain Descriptors / Indicators: Aching;Burning Pain Intervention(s): Limited activity within patient's tolerance;Monitored during session;Repositioned    Home Living Family/patient expects to be discharged to:: Private residence Living Arrangements: Alone Available Help at Discharge: Family;Available 24 hours/day (children) Type of Home: House Home Access: Ramped entrance (building a ramp)     Home Layout: One level Home Equipment: Walker - 2 wheels;Cane - single point;Shower seat (high toliet seat)      Prior Function Level of Independence: Independent with assistive device(s)         Comments: uses cane when she goes outside, not in the house     Hand Dominance   Dominant Hand: Right    Extremity/Trunk Assessment   Upper Extremity Assessment: Defer to OT evaluation           Lower Extremity Assessment: LLE deficits/detail   LLE Deficits / Details: left leg with normal post op pain and weakness.  Ankle 3/5, knee 2+/5, hip flexion 3-/5  Cervical / Trunk  Assessment: Normal  Communication   Communication: No difficulties  Cognition Arousal/Alertness: Lethargic Behavior During Therapy: WFL for tasks assessed/performed Overall Cognitive Status: Within Functional Limits for tasks assessed                               Assessment/Plan    PT Assessment Patient needs continued PT services  PT Diagnosis  Abnormality of gait;Difficulty walking;Acute pain;Generalized weakness   PT Problem List Decreased strength;Decreased range of motion;Decreased activity tolerance;Decreased balance;Decreased knowledge of use of DME;Decreased knowledge of precautions;Pain  PT Treatment Interventions DME instruction;Stair training;Gait training;Functional mobility training;Therapeutic activities;Therapeutic exercise;Balance training;Neuromuscular re-education;Patient/family education;Manual techniques;Modalities   PT Goals (Current goals can be found in the Care Plan section) Acute Rehab PT Goals Patient Stated Goal: to go home with family at discharge.  PT Goal Formulation: With patient Time For Goal Achievement: 12/30/14 Potential to Achieve Goals: Good    Frequency 7X/week          Time: 0045-9977 PT Time Calculation (min) (ACUTE ONLY): 16 min   Charges:   PT Evaluation $Initial PT Evaluation Tier I: 1 Procedure          Renee Dudley B. [place], [place], DPT [phone]   12/23/2014, 5:09 PM

## 2014-12-23 NOTE — Interval H&P Note (Signed)
History and Physical Interval Note:  12/23/2014 10:57 AM  Renee Dudley  has presented today for surgery, with the diagnosis of primary localizedOA left knee  The various methods of treatment have been discussed with the patient and family. After consideration of risks, benefits and other options for treatment, the patient has consented to  Procedure(s): LEFT TOTAL KNEE ARTHROPLASTY (Left) as a surgical intervention .  The patient's history has been reviewed, patient examined, no change in status, stable for surgery.  I have reviewed the patient's chart and labs.  Questions were answered to the patient's satisfaction.     Elsie Saas A

## 2014-12-23 NOTE — Transfer of Care (Signed)
Immediate Anesthesia Transfer of Care Note  Patient: Renee Dudley  Procedure(s) Performed: Procedure(s): LEFT TOTAL KNEE ARTHROPLASTY (Left)  Patient Location: PACU  Anesthesia Type:GA combined with regional for post-op pain  Level of Consciousness: awake, alert  and oriented  Airway & Oxygen Therapy: Patient Spontanous Breathing and Patient connected to nasal cannula oxygen  Post-op Assessment: Report given to RN, Post -op Vital signs reviewed and stable and Patient moving all extremities X 4  Post vital signs: Reviewed and stable  Last Vitals:  Filed Vitals:   12/23/14 1045  BP: 103/69  Pulse:   Temp:   Resp:     Complications: No apparent anesthesia complications

## 2014-12-24 ENCOUNTER — Encounter (HOSPITAL_COMMUNITY): Payer: Self-pay | Admitting: Orthopedic Surgery

## 2014-12-24 LAB — CBC
HCT: 32.4 % — ABNORMAL LOW (ref 36.0–46.0)
Hemoglobin: 10.1 g/dL — ABNORMAL LOW (ref 12.0–15.0)
MCH: 27.4 pg (ref 26.0–34.0)
MCHC: 31.2 g/dL (ref 30.0–36.0)
MCV: 88 fL (ref 78.0–100.0)
Platelets: 197 10*3/uL (ref 150–400)
RBC: 3.68 MIL/uL — ABNORMAL LOW (ref 3.87–5.11)
RDW: 16.5 % — AB (ref 11.5–15.5)
WBC: 12.1 10*3/uL — ABNORMAL HIGH (ref 4.0–10.5)

## 2014-12-24 LAB — BASIC METABOLIC PANEL
Anion gap: 11 (ref 5–15)
BUN: 22 mg/dL (ref 6–23)
CHLORIDE: 105 mmol/L (ref 96–112)
CO2: 22 mmol/L (ref 19–32)
CREATININE: 0.93 mg/dL (ref 0.50–1.10)
Calcium: 8.6 mg/dL (ref 8.4–10.5)
GFR calc Af Amer: 67 mL/min — ABNORMAL LOW (ref >90)
GFR calc non Af Amer: 58 mL/min — ABNORMAL LOW (ref >90)
Glucose, Bld: 133 mg/dL — ABNORMAL HIGH (ref 70–99)
Potassium: 4.7 mmol/L (ref 3.5–5.1)
Sodium: 138 mmol/L (ref 135–145)

## 2014-12-24 MED ORDER — SODIUM CHLORIDE 0.9 % IV BOLUS (SEPSIS)
500.0000 mL | Freq: Once | INTRAVENOUS | Status: AC
Start: 2014-12-24 — End: 2014-12-24
  Administered 2014-12-24: 500 mL via INTRAVENOUS

## 2014-12-24 MED ORDER — SODIUM CHLORIDE 0.9 % IV BOLUS (SEPSIS)
500.0000 mL | Freq: Once | INTRAVENOUS | Status: AC
  Administered 2014-12-24: 500 mL via INTRAVENOUS

## 2014-12-24 NOTE — Progress Notes (Signed)
Subjective: 1 Day Post-Op Procedure(s) (LRB): LEFT TOTAL KNEE ARTHROPLASTY (Left) Patient reports pain as 3 on 0-10 scale.    Objective: Vital signs in last 24 hours: Temp:  [97 F (36.1 C)-98.1 F (36.7 C)] 98.1 F (36.7 C) (04/12 0538) Pulse Rate:  [46-75] 75 (04/12 0538) Resp:  [10-16] 14 (04/12 0538) BP: (99-140)/(36-90) 129/53 mmHg (04/12 0538) SpO2:  [91 %-100 %] 100 % (04/12 0538) Weight:  [108.268 kg (238 lb 11 oz)] 108.268 kg (238 lb 11 oz) (04/11 0927)  Intake/Output from previous day: 04/11 0701 - 04/12 0700 In: 1000 [I.V.:1000] Out: 1100 [Urine:950; Blood:150] Intake/Output this shift: Total I/O In: -  Out: 300 [Urine:300]   Recent Labs  12/23/14 1628 12/24/14 0644  HGB 10.8* 10.1*    Recent Labs  12/23/14 1628 12/24/14 0644  WBC 12.0* 12.1*  RBC 3.88 3.68*  HCT 34.9* 32.4*  PLT 213 197    Recent Labs  12/23/14 1628 12/24/14 0644  NA  --  138  K  --  4.7  CL  --  105  CO2  --  22  BUN  --  22  CREATININE 1.02 0.93  GLUCOSE  --  133*  CALCIUM  --  8.6   No results for input(s): LABPT, INR in the last 72 hours.  ABD soft Neurovascular intact Sensation intact distally Intact pulses distally Dorsiflexion/Plantar flexion intact Incision: dressing C/D/I  Patient ended walk with me early due to dizziness and fatique.  Assessment/Plan: 1 Day Post-Op Procedure(s) (LRB): LEFT TOTAL KNEE ARTHROPLASTY (Left)  Principal Problem:   Primary localized osteoarthritis of left knee Active Problems:   Coronary atherosclerosis of native coronary artery   Mixed hyperlipidemia   Essential hypertension, benign   Carotid bruit   UTI (lower urinary tract infection)   DJD (degenerative joint disease) of knee  Advance diet Up with therapy Discharge home with home health tomorrow Restart IV and give fluid bolus due to dizziness  Renee Dudley J 12/24/2014, 8:40 AM

## 2014-12-24 NOTE — Plan of Care (Signed)
Problem: Consults Goal: Diagnosis- Total Joint Replacement Primary Total Knee Left     

## 2014-12-24 NOTE — Progress Notes (Signed)
Physical Therapy Treatment Patient Details Name: Renee Dudley MRN: [id number] DOB: [birth year] Today's Date: 12/24/2014    History of Present Illness 79 y.o. female admitted to [facility] on 12/23/14 for elective L TKA.  Pt with significant PMHx of R TKA,  peripherial edema (takes lasix daily), anxiety/depression, HTN, and gout.    PT Comments    Progressing with ambulation distance, close to independent SLR, still heavily relies on walker for ambulation.  Continue to recommend HHPT and family assist at d/c.  Follow Up Recommendations  Home health PT;Supervision for mobility/OOB     Equipment Recommendations  None recommended by PT    Recommendations for Other Services       Precautions / Restrictions Precautions Precautions: Knee Precaution Comments: WB status and precautions reviewed Required Braces or Orthoses: Knee Immobilizer - Left Knee Immobilizer - Left: On when out of bed or walking Restrictions Weight Bearing Restrictions: Yes LLE Weight Bearing: Weight bearing as tolerated    Mobility  Bed Mobility Overal bed mobility: Needs Assistance Bed Mobility: Supine to Sit     Supine to sit: Supervision;HOB elevated     General bed mobility comments: use of bed rails  Transfers Overall transfer level: Needs assistance Equipment used: Rolling walker (2 wheeled) Transfers: Sit to/from Stand Sit to Stand: Min guard         General transfer comment: Cues for safety and technique   Ambulation/Gait Ambulation/Gait assistance: Min guard   Assistive device: Rolling walker (2 wheeled) Gait Pattern/deviations: Step-through pattern;Antalgic;Decreased stride length;Trunk flexed     General Gait Details: cues for sequence initially, c/o UE fatigue walking in hallway   Stairs            Wheelchair Mobility    Modified Rankin (Stroke Patients Only)       Balance Overall balance assessment: Needs assistance Sitting-balance support: No upper extremity  supported;Feet supported Sitting balance-Leahy Scale: Good     Standing balance support: Bilateral upper extremity supported Standing balance-Leahy Scale: Fair Standing balance comment: can stand static without UE support, needs walker for ambulation                    Cognition Arousal/Alertness: Awake/alert Behavior During Therapy: WFL for tasks assessed/performed Overall Cognitive Status: Within Functional Limits for tasks assessed                      Exercises Total Joint Exercises Ankle Circles/Pumps: AROM;Both;10 reps;Supine Quad Sets: AROM;Both;Supine;10 reps Short Arc Quad: AROM;Left;Supine;10 reps Heel Slides: AAROM;Left;Supine;10 reps Hip ABduction/ADduction: AAROM;Left;10 reps;Supine Straight Leg Raises: AAROM;Left;Supine;10 reps    General Comments        Pertinent Vitals/Pain Pain Assessment: 0-10 Pain Score: 4  Pain Location: lt knee with exercise Pain Descriptors / Indicators: Burning;Constant Pain Intervention(s): Monitored during session;Ice applied;Repositioned    Home Living Family/patient expects to be discharged to:: Private residence Living Arrangements: Alone Available Help at Discharge: Family;Available 24 hours/day (children) Type of Home: House Home Access: Ramped entrance (currenlty building a ramp)   Home Layout: One level Home Equipment: Walker - 2 wheels;Cane - single point;Shower seat - built in (toilet seat riser - bolted)      Prior Function Level of Independence: Independent with assistive device(s)      Comments: uses cane when she goes outside, not in the house   PT Goals (current goals can now be found in the care plan section) Acute Rehab PT Goals Patient Stated Goal: go home tomorrow Progress towards PT  goals: Progressing toward goals    Frequency  7X/week    PT Plan Current plan remains appropriate    Co-evaluation             End of Session Equipment Utilized During Treatment: Gait belt;Left  knee immobilizer Activity Tolerance: Patient tolerated treatment well Patient left: in chair;with call bell/phone within reach;with family/visitor present     Time: 6659-9357 PT Time Calculation (min) (ACUTE ONLY): 30 min  Charges:  $Gait Training: 8-22 mins $Therapeutic Exercise: 8-22 mins                    G Codes:      Zahari Xiang,CYNDI 01-23-2015, 12:03 PM  Magda Kiel, [place] 01/23/2015

## 2014-12-24 NOTE — Evaluation (Signed)
Occupational Therapy Evaluation Patient Details Name: Renee Dudley MRN: [id number] DOB: 01-14-1936 Today's Date: 12/24/2014    History of Present Illness 79 y.o. female admitted to [facility] on 12/23/14 for elective L TKA.  Pt with significant PMHx of R TKA,  peripherial edema (takes lasix daily), anxiety/depression, HTN, and gout.   Clinical Impression   Patient independent PTA. Patient currently functioning at an overall supervision>min guard assist level. Patient will benefit from acute OT to increase overall independence in the areas of ADLs, functional mobility, and overall safety in order to safely discharge home with assistance from children.     Follow Up Recommendations  No OT follow up;Supervision/Assistance - 24 hour    Equipment Recommendations  None recommended by OT (Pt has built in shower seat and bolted toilet seat riser)    Recommendations for Other Services  None at this time   Precautions / Restrictions Precautions Precautions: Knee Precaution Comments: WB status and precautions reviewed Required Braces or Orthoses: Knee Immobilizer - Left Knee Immobilizer - Left: On when out of bed or walking Restrictions Weight Bearing Restrictions: Yes LLE Weight Bearing: Weight bearing as tolerated      Mobility Bed Mobility Overal bed mobility: Needs Assistance Bed Mobility: Supine to Sit     Supine to sit: Supervision     General bed mobility comments: use of bed rails  Transfers Overall transfer level: Needs assistance Equipment used: Rolling walker (2 wheeled) Transfers: Sit to/from Stand Sit to Stand: Supervision         General transfer comment: Cues for safety and technique     Balance Overall balance assessment: Needs assistance Sitting-balance support: No upper extremity supported;Feet supported Sitting balance-Leahy Scale: Good     Standing balance support: Bilateral upper extremity supported;During functional activity Standing balance-Leahy  Scale: Fair    ADL Overall ADL's : Needs assistance/impaired Eating/Feeding: Set up;Sitting   Grooming: Supervision/safety;Standing   Upper Body Bathing: Set up;Sitting   Lower Body Bathing: Min guard;Sit to/from stand;Cueing for safety   Upper Body Dressing : Set up;Sitting   Lower Body Dressing: Min guard;Sit to/from stand;Cueing for safety   Toilet Transfer: Supervision/safety;BSC;Ambulation;Cueing for safety       Tub/ Shower Transfer: Minimal assistance;Shower seat;Ambulation;Grab bars;Rolling walker   Functional mobility during ADLs: Supervision/safety;Min guard;Rolling walker General ADL Comments: Patient overall supervision>min guard for self-care and functional mobility/transfers. Patient uses RW to increase independence with tasks. Patient wearing left KI for mobility at this time. Patient states her children will be present for 24/7 supervision post acute d/c. Patient has a bolted in toilet seat riser and states this has been working for her. Discussed putting grab bars around toilet and in shower for overall safety.     Pertinent Vitals/Pain Pain Assessment: 0-10 Pain Score: 5  Pain Location: left knee Pain Descriptors / Indicators: Burning;Constant Pain Intervention(s): Repositioned;Monitored during session     Hand Dominance Right   Extremity/Trunk Assessment Upper Extremity Assessment Upper Extremity Assessment: RUE deficits/detail RUE Deficits / Details: Patient with bursitis >R shoulder per her report. Unable to fully flex or abduct shoulder. PROM is [facility].    Lower Extremity Assessment Lower Extremity Assessment: Defer to PT evaluation   Cervical / Trunk Assessment Cervical / Trunk Assessment: Normal   Communication Communication Communication: No difficulties   Cognition Arousal/Alertness: Awake/alert Behavior During Therapy: WFL for tasks assessed/performed Overall Cognitive Status: Within Functional Limits for tasks assessed               Home Living Family/patient  expects to be discharged to:: Private residence Living Arrangements: Alone Available Help at Discharge: Family;Available 24 hours/day (children) Type of Home: House Home Access: Ramped entrance (currenlty building a ramp)     Home Layout: One level     Bathroom Shower/Tub: Walk-in shower;Door   Bathroom Toilet: Handicapped height     Home Equipment: Environmental consultant - 2 wheels;Cane - single point;Shower seat - built in (toilet seat riser - bolted)   Prior Functioning/Environment Level of Independence: Independent with assistive device(s)  Comments: uses cane when she goes outside, not in the house    OT Diagnosis: Generalized weakness;Acute pain   OT Problem List: Decreased strength;Decreased range of motion;Decreased activity tolerance;Impaired balance (sitting and/or standing);Decreased safety awareness;Decreased knowledge of use of DME or AE;Decreased knowledge of precautions;Pain   OT Treatment/Interventions: Self-care/ADL training;Energy conservation;DME and/or AE instruction;Therapeutic activities;Patient/family education;Balance training    OT Goals(Current goals can be found in the care plan section) Acute Rehab OT Goals Patient Stated Goal: go home tomorrow OT Goal Formulation: With patient/family Time For Goal Achievement: 12/31/14 Potential to Achieve Goals: Good ADL Goals Pt Will Perform Grooming: with modified independence;standing Pt Will Perform Upper Body Bathing: Independently;sitting Pt Will Perform Lower Body Bathing: with modified independence;sit to/from stand Pt Will Perform Upper Body Dressing: Independently;sitting Pt Will Perform Lower Body Dressing: with modified independence;sit to/from stand Pt Will Transfer to Toilet: with modified independence;ambulating;bedside commode Pt Will Perform Toileting - Clothing Manipulation and hygiene: with modified independence;sit to/from stand Pt Will Perform Tub/Shower Transfer: Shower  transfer;ambulating;shower seat;grab bars;rolling walker  OT Frequency: Min 2X/week   Barriers to D/C: None known at this time          End of Session Equipment Utilized During Treatment: Rolling walker;Left knee immobilizer CPM Left Knee CPM Left Knee: Off  Activity Tolerance: Patient tolerated treatment well Patient left: in bed;with call bell/phone within reach;with family/visitor present   Time: 9379-0240 OT Time Calculation (min): 27 min Charges:  OT General Charges $OT Visit: 1 Procedure OT Evaluation $Initial OT Evaluation Tier I: 1 Procedure OT Treatments $Self Care/Home Management : 8-22 mins  [place] Route , MS, OTR/L, CLT Pager: [phone]  12/24/2014, 11:08 AM

## 2014-12-24 NOTE — Progress Notes (Signed)
Physical Therapy Treatment Patient Details Name: Renee Dudley MRN: [id number] DOB: Jun 23, 1936 Today's Date: 01/11/15    History of Present Illness 79 y.o. female admitted to [facility] on 12/23/14 for elective L TKA.  Pt with significant PMHx of R TKA,  peripherial edema (takes lasix daily), anxiety/depression, HTN, and gout.    PT Comments    Patient progressing slowly.  Fatigued this pm so did not increase ambulation distance.  Feel safe, though for d/c home.  Reports ramp being built for home entry.  Follow Up Recommendations  Home health PT;Supervision for mobility/OOB     Equipment Recommendations  None recommended by PT    Recommendations for Other Services       Precautions / Restrictions Precautions Precautions: Knee Required Braces or Orthoses: Knee Immobilizer - Left Knee Immobilizer - Left: On when out of bed or walking Restrictions LLE Weight Bearing: Weight bearing as tolerated    Mobility  Bed Mobility Overal bed mobility: Needs Assistance Bed Mobility: Sit to Supine       Sit to supine: Min assist   General bed mobility comments: for left leg in bed  Transfers Overall transfer level: Needs assistance Equipment used: Rolling walker (2 wheeled) Transfers: Sit to/from Stand Sit to Stand: Min guard         General transfer comment: for safety  Ambulation/Gait Ambulation/Gait assistance: Supervision Ambulation Distance (Feet): 80 Feet Assistive device: Rolling walker (2 wheeled) Gait Pattern/deviations: Step-through pattern;Decreased stride length;Antalgic     General Gait Details: self limited due to fatigue   Stairs            Wheelchair Mobility    Modified Rankin (Stroke Patients Only)       Balance Overall balance assessment: Needs assistance           Standing balance-Leahy Scale: Fair Standing balance comment: washed hands at sink with supervision                    Cognition Arousal/Alertness:  Awake/alert Behavior During Therapy: WFL for tasks assessed/performed Overall Cognitive Status: Within Functional Limits for tasks assessed                      Exercises      General Comments        Pertinent Vitals/Pain Pain Assessment: Faces Faces Pain Scale: Hurts little more Pain Location: lt knee weight bearing Pain Descriptors / Indicators: Aching Pain Intervention(s): Monitored during session    Home Living                      Prior Function            PT Goals (current goals can now be found in the care plan section) Progress towards PT goals: Progressing toward goals    Frequency  7X/week    PT Plan Current plan remains appropriate    Co-evaluation             End of Session Equipment Utilized During Treatment: Gait belt;Left knee immobilizer Activity Tolerance: Patient tolerated treatment well Patient left: in bed;with call bell/phone within reach;with family/visitor present     Time: 1829-9371 PT Time Calculation (min) (ACUTE ONLY): 23 min  Charges:  $Gait Training: 8-22 mins $Therapeutic Activity: 8-22 mins                    G Codes:      Sky Primo,CYNDI Jan 11, 2015, 4:32 PM  Magda Kiel,  PT [phone] 12/24/2014

## 2014-12-25 LAB — BASIC METABOLIC PANEL
Anion gap: 6 (ref 5–15)
BUN: 26 mg/dL — AB (ref 6–23)
CALCIUM: 8.7 mg/dL (ref 8.4–10.5)
CO2: 24 mmol/L (ref 19–32)
Chloride: 110 mmol/L (ref 96–112)
Creatinine, Ser: 1.06 mg/dL (ref 0.50–1.10)
GFR calc Af Amer: 58 mL/min — ABNORMAL LOW (ref >90)
GFR calc non Af Amer: 50 mL/min — ABNORMAL LOW (ref >90)
Glucose, Bld: 125 mg/dL — ABNORMAL HIGH (ref 70–99)
Potassium: 4.4 mmol/L (ref 3.5–5.1)
SODIUM: 140 mmol/L (ref 135–145)

## 2014-12-25 LAB — CBC
HEMATOCRIT: 27.2 % — AB (ref 36.0–46.0)
Hemoglobin: 8.7 g/dL — ABNORMAL LOW (ref 12.0–15.0)
MCH: 28.2 pg (ref 26.0–34.0)
MCHC: 32 g/dL (ref 30.0–36.0)
MCV: 88.3 fL (ref 78.0–100.0)
Platelets: 161 10*3/uL (ref 150–400)
RBC: 3.08 MIL/uL — ABNORMAL LOW (ref 3.87–5.11)
RDW: 16.8 % — ABNORMAL HIGH (ref 11.5–15.5)
WBC: 9.1 10*3/uL (ref 4.0–10.5)

## 2014-12-25 MED ORDER — ENOXAPARIN SODIUM 30 MG/0.3ML ~~LOC~~ SOLN: 30.0000 mg | SUBCUTANEOUS | Status: DC

## 2014-12-25 MED ORDER — OXYCODONE HCL 5 MG PO TABS: ORAL_TABLET | ORAL | Status: DC

## 2014-12-25 MED ORDER — PREDNISONE 5 MG PO TABS: 5.0000 mg | ORAL_TABLET | Freq: Every day | ORAL | Status: DC

## 2014-12-25 MED ORDER — POLYETHYLENE GLYCOL 3350 17 G PO PACK: 17.0000 g | PACK | Freq: Two times a day (BID) | ORAL | Status: DC

## 2014-12-25 NOTE — Anesthesia Postprocedure Evaluation (Signed)
  Anesthesia Post-op Note  Patient: Renee Dudley  Procedure(s) Performed: Procedure(s): LEFT TOTAL KNEE ARTHROPLASTY (Left)  Patient Location: PACU  Anesthesia Type:GA combined with regional for post-op pain  Level of Consciousness: awake and alert   Airway and Oxygen Therapy: Patient Spontanous Breathing  Post-op Pain: mild  Post-op Assessment: Post-op Vital signs reviewed  Post-op Vital Signs: Reviewed  Last Vitals:  Filed Vitals:   12/25/14 0542  BP: 128/53  Pulse: 59  Temp: 36.6 C  Resp: 18    Complications: No apparent anesthesia complications

## 2014-12-25 NOTE — Progress Notes (Signed)
Physical Therapy Treatment Patient Details Name: Renee Dudley MRN: [id number] DOB: 10-07-1935 Today's Date: 12/25/2014    History of Present Illness 79 y.o. female admitted to [facility] on 12/23/14 for elective L TKA.  Pt with significant PMHx of R TKA,  peripherial edema (takes lasix daily), anxiety/depression, HTN, and gout.    PT Comments    Pt is progressing nicely today with both gait and exercises.  She no longer needs to use her KI as her LAQ and SLR strength are excellent.  She is due to d/c home with family's assist and has demonstrated safety with mobility to do so today.  Follow Up Recommendations  Home health PT;Supervision for mobility/OOB     Equipment Recommendations  None recommended by PT    Recommendations for Other Services   NA     Precautions / Restrictions Precautions Precautions: Knee Precaution Booklet Issued: Yes (comment) Precaution Comments: WB status and precautions reviewed Required Braces or Orthoses: Knee Immobilizer - Left (d/c'd at the end of session, pt doesn't need it anymore) Knee Immobilizer - Left: On when out of bed or walking Restrictions Weight Bearing Restrictions: Yes LLE Weight Bearing: Weight bearing as tolerated    Mobility   Transfers Overall transfer level: Modified independent Equipment used: Rolling walker (2 wheeled) Transfers: Sit to/from Stand Sit to Stand: Modified independent (Device/Increase time)         General transfer comment: used hands safely for transitions.   Ambulation/Gait Ambulation/Gait assistance: Supervision Ambulation Distance (Feet): 120 Feet Assistive device: Rolling walker (2 wheeled) Gait Pattern/deviations: Step-through pattern;Antalgic     General Gait Details: Pt with mildly antalgic gait pattern.  No buckling at the knee.  Used KI, but did not need to as pt had great LAQ and SLR (assessed at the end of the session).     Stairs Stairs:  (confirmed that family was able to finish WC ramp  at home)                 Balance Overall balance assessment: Needs assistance Sitting-balance support: Feet supported;No upper extremity supported Sitting balance-Leahy Scale: Good     Standing balance support: No upper extremity supported;Bilateral upper extremity supported;Single extremity supported Standing balance-Leahy Scale: Good                      Cognition Arousal/Alertness: Awake/alert Behavior During Therapy: WFL for tasks assessed/performed Overall Cognitive Status: Within Functional Limits for tasks assessed                      Exercises Total Joint Exercises Ankle Circles/Pumps: AROM;Both;20 reps;Supine Quad Sets: AROM;Both;10 reps;Supine Towel Squeeze: AROM;Both;10 reps;Supine Short Arc Quad: AROM;Left;10 reps;Supine Heel Slides: AROM;Left;10 reps;Supine Hip ABduction/ADduction: AROM;Left;10 reps;Supine Straight Leg Raises: AROM;Left;10 reps;Supine Long Arc Quad: AROM;Left;10 reps;Supine Knee Flexion: AROM;Left;10 reps;Seated Goniometric ROM: 8-93 degrees of flexion AROM in sitting.         Pertinent Vitals/Pain Pain Assessment: 0-10 Pain Score: 4  Pain Location: left knee Pain Descriptors / Indicators: Aching;Burning Pain Intervention(s): Limited activity within patient's tolerance;Monitored during session;Repositioned           PT Goals (current goals can now be found in the care plan section) Acute Rehab PT Goals Patient Stated Goal: to go home today Progress towards PT goals: Progressing toward goals    Frequency  7X/week    PT Plan Current plan remains appropriate       End of Session Equipment Utilized During Treatment: Left  knee immobilizer Activity Tolerance: Patient tolerated treatment well Patient left: in chair;with call bell/phone within reach;with family/visitor present     Time: 3557-3220 PT Time Calculation (min) (ACUTE ONLY): 18 min  Charges:  $Therapeutic Exercise: 8-22 mins           Renee Dudley  B. [place], [place], DPT [phone]   12/25/2014, 2:15 PM

## 2014-12-25 NOTE — Discharge Summary (Signed)
Patient ID: Renee Dudley MRN: [id number] DOB/AGE: [birth year] 79 y.o.  Admit date: 12/23/2014 Discharge date: 12/25/2014  Admission Diagnoses:  Principal Problem:   Primary localized osteoarthritis of left knee Active Problems:   Coronary atherosclerosis of native coronary artery   Mixed hyperlipidemia   Essential hypertension, benign   Carotid bruit   UTI (lower urinary tract infection)   DJD (degenerative joint disease) of knee   Discharge Diagnoses:  Same  Past Medical History  Diagnosis Date  . Mixed hyperlipidemia   . DJD (degenerative joint disease)   . Peripheral edema     takes Lasix daily  . Primary localized osteoarthritis of left knee   . Anxiety     takes Xanax daily  . Depression     takes Zoloft daily  . Essential hypertension, benign     takes Atenolol daily  . Chronic headaches     takes Topamax daily  . Coronary atherosclerosis of native coronary artery     takes ASA daily  . Constipation     takes Colace nightly  . Joint pain   . History of gout     colchicine as needed  . GERD (gastroesophageal reflux disease)     takes Nexium daily  . UTI (lower urinary tract infection)     x 4 in past yr-most recent a month ago;was treated with antibiotic that completed 3 wks ago    Surgeries: Procedure(s): LEFT TOTAL KNEE ARTHROPLASTY on 12/23/2014   Consultants:    Discharged Condition: Improved  Hospital Course: Renee Dudley is an 79 y.o. female who was admitted 12/23/2014 for operative treatment ofPrimary localized osteoarthritis of left knee. Patient has severe unremitting pain that affects sleep, daily activities, and work/hobbies. After pre-op clearance the patient was taken to the operating room on 12/23/2014 and underwent  Procedure(s): LEFT TOTAL KNEE ARTHROPLASTY.    Patient was given perioperative antibiotics: Anti-infectives    Start     Dose/Rate Route Frequency Ordered Stop   12/23/14 1515  ceFAZolin (ANCEF) IVPB 2 g/50 mL premix      2 g 100 mL/hr over 30 Minutes Intravenous Every 6 hours 12/23/14 1455 12/24/14 0314   12/23/14 1217  cefUROXime (ZINACEF) injection  Status:  Discontinued       As needed 12/23/14 1217 12/23/14 1322   12/23/14 0600  ceFAZolin (ANCEF) IVPB 2 g/50 mL premix     2 g 100 mL/hr over 30 Minutes Intravenous On call to O.R. 12/22/14 1310 12/23/14 1128       Patient was given sequential compression devices, early ambulation, and chemoprophylaxis to prevent DVT.  Patient benefited maximally from hospital stay and there were no complications.    Recent vital signs: Patient Vitals for the past 24 hrs:  BP Temp Temp src Pulse Resp SpO2  12/25/14 0542 (!) 128/53 mmHg 97.9 F (36.6 C) - (!) 59 18 98 %  12/25/14 0400 - - - - 16 96 %  12/25/14 0000 - - - - 16 98 %  12/24/14 2000 - - - - 18 98 %  12/24/14 1959 (!) 106/40 mmHg 98.3 F (36.8 C) Oral 62 18 97 %  12/24/14 1402 (!) 102/43 mmHg 98.2 F (36.8 C) - 64 18 99 %     Recent laboratory studies:  Recent Labs  12/24/14 0644 12/25/14 0523  WBC 12.1* 9.1  HGB 10.1* 8.7*  HCT 32.4* 27.2*  PLT 197 161  NA 138 140  K 4.7 4.4  CL 105 110  CO2 22 24  BUN 22 26*  CREATININE 0.93 1.06  GLUCOSE 133* 125*  CALCIUM 8.6 8.7     Discharge Medications:     Medication List    STOP taking these medications        aspirin 81 MG tablet     HYDROcodone-acetaminophen 10-500 MG per tablet  Commonly known as:  LORTAB      TAKE these medications        ALPRAZolam 0.25 MG tablet  Commonly known as:  XANAX  Take 0.5 mg by mouth daily as needed for anxiety.     atenolol 50 MG tablet  Commonly known as:  TENORMIN  Take 0.5 tablets (25 mg total) by mouth 2 (two) times daily.     docusate sodium 100 MG capsule  Commonly known as:  COLACE  Take 400 mg by mouth at bedtime.     enoxaparin 30 MG/0.3ML injection  Commonly known as:  LOVENOX  Inject 0.3 mLs (30 mg total) into the skin daily.     esomeprazole 40 MG capsule  Commonly known  as:  NEXIUM  Take 40 mg by mouth daily before breakfast.     furosemide 40 MG tablet  Commonly known as:  LASIX  Take 40 mg by mouth daily.     oxyCODONE 5 MG immediate release tablet  Commonly known as:  Oxy IR/ROXICODONE  1-2 tablets every 4-6 hrs as needed for pain     polyethylene glycol packet  Commonly known as:  MIRALAX / GLYCOLAX  Take 17 g by mouth 2 (two) times daily.     potassium chloride SA 20 MEQ tablet  Commonly known as:  K-DUR,KLOR-CON  Take 10 mEq by mouth daily.     predniSONE 5 MG tablet  Commonly known as:  DELTASONE  Take 1 tablet (5 mg total) by mouth daily with breakfast.     sertraline 25 MG tablet  Commonly known as:  ZOLOFT  Take 25 mg by mouth at bedtime.     topiramate 50 MG tablet  Commonly known as:  TOPAMAX  Take 50 mg by mouth 2 (two) times daily.        Diagnostic Studies: No results found.  Disposition: 01-Home or Self Care      Discharge Instructions    CPM    Complete by:  As directed   Continuous passive motion machine (CPM):      Use the CPM from 0 to 90 for 6 hours per day.       You may break it up into 2 or 3 sessions per day.      Use CPM for 2 weeks or until you are told to stop.     Call MD / Call 911    Complete by:  As directed   If you experience chest pain or shortness of breath, CALL 911 and be transported to the hospital emergency room.  If you develope a fever above 101 F, pus (white drainage) or increased drainage or redness at the wound, or calf pain, call your surgeon's office.     Change dressing    Complete by:  As directed   Change the gauze dressing daily with sterile 4 x 4 inch gauze and apply TED hose.  DO NOT REMOVE BANDAGE OVER SURGICAL INCISION.  [place] WHOLE LEG INCLUDING OVER THE WATERPROOF BANDAGE WITH SOAP AND WATER EVERY DAY.     Constipation Prevention    Complete by:  As directed   Drink  plenty of fluids.  Prune juice may be helpful.  You may use a stool softener, such as Colace (over the  counter) 100 mg twice a day.  Use MiraLax (over the counter) for constipation as needed.     Diet - low sodium heart healthy    Complete by:  As directed      Discharge instructions    Complete by:  As directed   INSTRUCTIONS AFTER JOINT REPLACEMENT   Remove items at home which could result in a fall. This includes throw rugs or furniture in walking pathways ICE to the affected joint every three hours while awake for 30 minutes at a time, for at least the first 3-5 days, and then as needed for pain and swelling.  Continue to use ice for pain and swelling. You may notice swelling that will progress down to the foot and ankle.  This is normal after surgery.  Elevate your leg when you are not up walking on it.   Continue to use the breathing machine you got in the hospital (incentive spirometer) which will help keep your temperature down.  It is common for your temperature to cycle up and down following surgery, especially at night when you are not up moving around and exerting yourself.  The breathing machine keeps your lungs expanded and your temperature down.   DIET:  As you were doing prior to hospitalization, we recommend a well-balanced diet.  DRESSING / WOUND CARE / SHOWERING  Keep the surgical dressing until follow up.  The dressing is water proof, so you can shower without any extra covering.  IF THE DRESSING FALLS OFF or the wound gets wet inside, change the dressing with sterile gauze.  Please use good hand washing techniques before changing the dressing.  Do not use any lotions or creams on the incision until instructed by your surgeon.    ACTIVITY  Increase activity slowly as tolerated, but follow the weight bearing instructions below.   No driving for 6 weeks or until further direction given by your physician.  You cannot drive while taking narcotics.  No lifting or carrying greater than 10 lbs. until further directed by your surgeon. Avoid periods of inactivity such as sitting  longer than an hour when not asleep. This helps prevent blood clots.  You may return to work once you are authorized by your doctor.     WEIGHT BEARING   Weight bearing as tolerated with assist device (walker, cane, etc) as directed, use it as long as suggested by your surgeon or therapist, typically at least 4-6 weeks.   EXERCISES  Results after joint replacement surgery are often greatly improved when you follow the exercise, range of motion and muscle strengthening exercises prescribed by your doctor. Safety measures are also important to protect the joint from further injury. Any time any of these exercises cause you to have increased pain or swelling, decrease what you are doing until you are comfortable again and then slowly increase them. If you have problems or questions, call your caregiver or physical therapist for advice.   Rehabilitation is important following a joint replacement. After just a few days of immobilization, the muscles of the leg can become weakened and shrink (atrophy).  These exercises are designed to build up the tone and strength of the thigh and leg muscles and to improve motion. Often times heat used for twenty to thirty minutes before working out will loosen up your tissues and help with improving the range of  motion but do not use heat for the first two weeks following surgery (sometimes heat can increase post-operative swelling).   These exercises can be done on a training (exercise) mat, on the floor, on a table or on a bed. Use whatever works the best and is most comfortable for you.    Use music or television while you are exercising so that the exercises are a pleasant break in your day. This will make your life better with the exercises acting as a break in your routine that you can look forward to.   Perform all exercises about fifteen times, three times per day or as directed.  You should exercise both the operative leg and the other leg as well.    Exercises include:   Quad Sets - Tighten up the muscle on the front of the thigh (Quad) and hold for 5-10 seconds.   Straight Leg Raises - With your knee straight (if you were given a brace, keep it on), lift the leg to 60 degrees, hold for 3 seconds, and slowly lower the leg.  Perform this exercise against resistance later as your leg gets stronger.  Leg Slides: Lying on your back, slowly slide your foot toward your buttocks, bending your knee up off the floor (only go as far as is comfortable). Then slowly slide your foot back down until your leg is flat on the floor again.  Angel Wings: Lying on your back spread your legs to the side as far apart as you can without causing discomfort.  Hamstring Strength:  Lying on your back, push your heel against the floor with your leg straight by tightening up the muscles of your buttocks.  Repeat, but this time bend your knee to a comfortable angle, and push your heel against the floor.  You may put a pillow under the heel to make it more comfortable if necessary.   A rehabilitation program following joint replacement surgery can speed recovery and prevent re-injury in the future due to weakened muscles. Contact your doctor or a physical therapist for more information on knee rehabilitation.    CONSTIPATION  Constipation is defined medically as fewer than three stools per week and severe constipation as less than one stool per week.  Even if you have a regular bowel pattern at home, your normal regimen is likely to be disrupted due to multiple reasons following surgery.  Combination of anesthesia, postoperative narcotics, change in appetite and fluid intake all can affect your bowels.   YOU MUST use at least one of the following options; they are listed in order of increasing strength to get the job done.  They are all available over the counter, and you may need to use some, POSSIBLY even all of these options:    Drink plenty of fluids (prune juice may  be helpful) and high fiber foods Colace 100 mg by mouth twice a day  Senokot for constipation as directed and as needed Dulcolax (bisacodyl), take with full glass of water  Miralax (polyethylene glycol) once or twice a day as needed.  If you have tried all these things and are unable to have a bowel movement in the first 3-4 days after surgery call either your surgeon or your primary doctor.    If you experience loose stools or diarrhea, hold the medications until you stool forms back up.  If your symptoms do not get better within 1 week or if they get worse, check with your doctor.  If you experience "  the worst abdominal pain ever" or develop nausea or vomiting, please contact the office immediately for further recommendations for treatment.   ITCHING:  If you experience itching with your medications, try taking only a single pain pill, or even half a pain pill at a time.  You can also use Benadryl over the counter for itching or also to help with sleep.   TED HOSE STOCKINGS:  Use stockings on both legs until for at least 2 weeks or as directed by physician office. They may be removed at night for sleeping.  MEDICATIONS:  See your medication summary on the "After Visit Summary" that nursing will review with you.  You may have some home medications which will be placed on hold until you complete the course of blood thinner medication.  It is important for you to complete the blood thinner medication as prescribed.  PRECAUTIONS:  If you experience chest pain or shortness of breath - call 911 immediately for transfer to the hospital emergency department.   If you develop a fever greater that 101 F, purulent drainage from wound, increased redness or drainage from wound, foul odor from the wound/dressing, or calf pain - CONTACT YOUR SURGEON.                                                   FOLLOW-UP APPOINTMENTS:  If you do not already have a post-op appointment, please call the office for an  appointment to be seen by your surgeon.  Guidelines for how soon to be seen are listed in your "After Visit Summary", but are typically between 1-4 weeks after surgery.  OTHER INSTRUCTIONS:   Knee Replacement:  Do not place pillow under knee, focus on keeping the knee straight while resting. CPM instructions: 0-90 degrees, 2 hours in the morning, 2 hours in the afternoon, and 2 hours in the evening. Place foam block, curve side up under heel at all times except when in CPM or when walking.  DO NOT modify, tear, cut, or change the foam block in any way.  MAKE SURE YOU:  Understand these instructions.  Get help right away if you are not doing well or get worse.    Thank you for letting us be a part of your medical care team.  It is a privilege we respect greatly.  We hope these instructions will help you stay on track for a fast and full recovery!     Do not put a pillow under the knee. Place it under the heel.    Complete by:  As directed   Place gray foam block, curve side up under heel at all times except when in CPM or when walking.  DO NOT modify, tear, cut, or change in any way the gray foam block.     Increase activity slowly as tolerated    Complete by:  As directed      TED hose    Complete by:  As directed   Use stockings (TED hose) for 2 weeks on both leg(s).  You may remove them at night for sleeping.           Follow-up Information    Follow up with Lorn Junes, MD On 01/07/2015.   Specialty:  Orthopedic Surgery   Why:  appt time 3:40 pm   Contact information:   [address]  [place] [address] [phone]        Signed: Linda Hedges 12/25/2014, 8:57 AM

## 2014-12-25 NOTE — Progress Notes (Signed)
Occupational Therapy Treatment Patient Details Name: Renee Dudley MRN: [id number] DOB: 11/16/1935 Today's Date: 12/25/2014    History of present illness 79 y.o. female admitted to [facility] on 12/23/14 for elective L TKA.  Pt with significant PMHx of R TKA,  peripherial edema (takes lasix daily), anxiety/depression, HTN, and gout.   OT comments  Patient progressing nicely towards OT goals, continue plan of care for now. Patient overall supervision>mod I for functional tasks at this time. Patient hopes to discharge > home today with assistance from children. Patient stated her son just finished building entrance ramp>front door.    Follow Up Recommendations  No OT follow up;Supervision/Assistance - 24 hour    Equipment Recommendations  None recommended by OT (Pt has built in shower seat and bolted toilet seat risers)    Recommendations for Other Services  None at this time  Precautions / Restrictions Precautions Precautions: Knee Precaution Comments: WB status and precautions reviewed Required Braces or Orthoses: Knee Immobilizer - Left Knee Immobilizer - Left: On when out of bed or walking Restrictions Weight Bearing Restrictions: Yes LLE Weight Bearing: Weight bearing as tolerated       Mobility Bed Mobility General bed mobility comments: Patient found seated in recliner upon entering room  Transfers Overall transfer level: Modified independent Equipment used: Rolling walker (2 wheeled) Transfers: Sit to/from Stand    Balance Overall balance assessment: No apparent balance deficits (not formally assessed)   ADL General ADL Comments: Assisted patient > therapy gym and patient practiced shower stall transfer using simulated block, RW, and shower seat. Patient mod I for this transfer; no verbal cues needed for safety. KI donned during all mobility and encouraged patient to wear KI at this time for safety to ensure no buckling of L knee. Patient with no further questions. IF  patient still here at the end of the week, will plan to see her for focus on functional mobility/transfers, and ADLs.      Cognition   Behavior During Therapy: WFL for tasks assessed/performed Overall Cognitive Status: Within Functional Limits for tasks assessed                 Pertinent Vitals/ Pain       Pain Assessment: 0-10 Pain Score: 3  Pain Location: left knee Pain Descriptors / Indicators: Sore Pain Intervention(s): Monitored during session         Frequency Min 2X/week     Progress Toward Goals  OT Goals(current goals can now be found in the care plan section)  Progress towards OT goals: Progressing toward goals     Plan Discharge plan remains appropriate       End of Session Equipment Utilized During Treatment: Rolling walker;Left knee immobilizer   Activity Tolerance Patient tolerated treatment well   Patient Left in chair;with call bell/phone within reach;with family/visitor present     Time: 8250-0370 OT Time Calculation (min): 17 min  Charges: OT General Charges $OT Visit: 1 Procedure OT Treatments $Self Care/Home Management : 8-22 mins  Lonnel Gjerde , MS, OTR/L, CLT Pager: [phone]  12/25/2014, 9:34 AM

## 2015-03-12 ENCOUNTER — Other Ambulatory Visit (HOSPITAL_COMMUNITY): Payer: Self-pay | Admitting: Orthopedic Surgery

## 2015-03-12 DIAGNOSIS — M545 Low back pain: Secondary | ICD-10-CM

## 2015-03-25 ENCOUNTER — Ambulatory Visit (HOSPITAL_COMMUNITY)
Admission: RE | Admit: 2015-03-25 | Discharge: 2015-03-25 | Disposition: A | Discharge status: Home / Self Care | Payer: Medicare Other | Source: Ambulatory Visit | Attending: Orthopedic Surgery | Admitting: Orthopedic Surgery

## 2015-03-25 DIAGNOSIS — M545 Low back pain: Secondary | ICD-10-CM | POA: Diagnosis present

## 2015-07-14 ENCOUNTER — Ambulatory Visit (INDEPENDENT_AMBULATORY_CARE_PROVIDER_SITE_OTHER): Payer: Medicare Other | Admitting: Cardiology

## 2015-07-14 ENCOUNTER — Encounter: Payer: Self-pay | Admitting: Cardiology

## 2015-07-14 VITALS — BP 132/76 | HR 55 | Ht 66.0 in | Wt 222.8 lb

## 2015-07-14 DIAGNOSIS — I2510 Atherosclerotic heart disease of native coronary artery without angina pectoris: Secondary | ICD-10-CM | POA: Diagnosis not present

## 2015-07-14 DIAGNOSIS — I6523 Occlusion and stenosis of bilateral carotid arteries: Secondary | ICD-10-CM | POA: Diagnosis not present

## 2015-07-14 NOTE — Patient Instructions (Signed)

## 2015-07-14 NOTE — Progress Notes (Signed)
Cardiology Office Note  Date: 07/14/2015   ID: Renee Dudley, DOB 01/09/1936, MRN [id number]  PCP: [name], MD  Primary Cardiologist: [name], MD   Chief Complaint  Patient presents with  . Coronary Artery Disease    History of Present Illness: Renee Dudley is a 79 y.o. female last seen in March 2016. She presents for a routine follow-up visit. From a cardiac perspective, she has had no angina symptoms. We reviewed her medications which are outlined below, she continues on beta blocker.  She underwent left total knee arthroplasty with Dr. Noemi Chapel back in April of this year, no perioperative cardiac complications. Ischemic workup from 2015 was low risk, and we have continued observation.  She still reports knee pain with ambulation, but is functional in her ADLs. She cooks a large dinner for her family every Sunday. Also continues to enjoy making Christmas ornaments for her family each year.   Past Medical History  Diagnosis Date  . Mixed hyperlipidemia   . DJD (degenerative joint disease)   . Primary localized osteoarthritis of left knee   . Anxiety   . Depression   . Essential hypertension, benign   . Chronic headaches   . Coronary atherosclerosis of native coronary artery     Nonobstructive at cardiac catheterization 2007   . History of gout   . GERD (gastroesophageal reflux disease)   . Varicose veins   . Peripheral edema     Current Outpatient Prescriptions  Medication Sig Dispense Refill  . ALPRAZolam (XANAX) 0.25 MG tablet Take 0.5 mg by mouth daily as needed for anxiety.     [name] atenolol (TENORMIN) 50 MG tablet Take 50 mg by mouth daily.    [name] docusate sodium (COLACE) 100 MG capsule Take 400 mg by mouth at bedtime.    [name] esomeprazole (NEXIUM) 40 MG capsule Take 40 mg by mouth daily before breakfast.      . furosemide (LASIX) 40 MG tablet Take 40 mg by mouth daily.      [name] HYDROcodone-acetaminophen (NORCO) 10-325 MG tablet Take 1 tablet by  mouth as needed.  0  . sertraline (ZOLOFT) 25 MG tablet Take 25 mg by mouth at bedtime.     . topiramate (TOPAMAX) 50 MG tablet Take 50 mg by mouth 2 (two) times daily.      [name] VITAMIN D, CHOLECALCIFEROL, PO Take 1 tablet by mouth daily.     No current facility-administered medications for this visit.    Allergies:  Penicillins; Levofloxacin; Sulfa antibiotics; and Zocor   Social History: The patient  reports that she has never smoked. She has never used smokeless tobacco. She reports that she does not drink alcohol or use illicit drugs.   ROS:  Please see the history of present illness. Otherwise, complete review of systems is positive for knee pain..  All other systems are reviewed and negative.   Physical Exam: VS:  BP 132/76 mmHg  Pulse 55  Ht 5\' 6"  (1.676 m)  Wt 222 lb 12.8 oz (101.061 kg)  BMI 35.98 kg/m2  SpO2 99%, BMI Body mass index is 35.98 kg/(m^2).  Wt Readings from Last 3 Encounters:  07/14/15 222 lb 12.8 oz (101.061 kg)  03/25/15 240 lb (108.863 kg)  12/23/14 238 lb 11 oz (108.268 kg)     Overweight woman in no acute distress.  HEENT: Conjunctiva and lids are normal, oropharnyx with moist mucosa.  Neck: Supple, no elevated JVP, questionable left carotid bruit, no thyromegaly.  Lungs:  Clear to auscultation, nonlabored.  Cardiac: Regular rate and rhythm, no S3 gallop or rub.  Abdomen: Soft, nontender, bowel sounds present.  Musculoskeletal: No kyphosis.  Extremity: No pitting edema, some right knee swelling.    ECG: ECG from 11/28/2014 showed sinus bradycardia.   Recent Labwork: 12/12/2014: ALT 11; AST 16 12/25/2014: BUN 26*; Creatinine, Ser 1.06; Hemoglobin 8.7*; Platelets 161; Potassium 4.4; Sodium 140   Other Studies Reviewed Today:  1. Lexiscan Cardiolite in February 2015 showed no diagnostic ST segment changes, breast attenuation without clear evidence of scar or ischemia, LVEF 77%.  2. Carotid Dopplers 01/09/2014 showed 1-39% bilateral ICA  stenoses.   Assessment and Plan:  1. History of nonobstructive CAD with negative follow-up ischemic workup from last year. Plan is to continue observation. She continues on beta blocker.  2. Mild carotid atherosclerosis by Doppler studies from last year. Keep follow-up with Dr. Nadara Mustard for risk surveillance including lipids and aspirin therapy.  Current medicines were reviewed with the patient today.  Disposition: FU with me in 1 year.   Signed, Satira Sark, MD, [facility] 07/14/2015 3:37 PM    [facility] [place], [place], [place] [address] Phone: [phone]; Fax: [phone]

## 2015-11-25 DIAGNOSIS — E559 Vitamin D deficiency, unspecified: Secondary | ICD-10-CM | POA: Diagnosis not present

## 2015-11-25 DIAGNOSIS — G2581 Restless legs syndrome: Secondary | ICD-10-CM | POA: Diagnosis not present

## 2015-11-25 DIAGNOSIS — M7551 Bursitis of right shoulder: Secondary | ICD-10-CM | POA: Diagnosis not present

## 2015-11-25 DIAGNOSIS — I10 Essential (primary) hypertension: Secondary | ICD-10-CM | POA: Diagnosis not present

## 2015-11-25 DIAGNOSIS — M1000 Idiopathic gout, unspecified site: Secondary | ICD-10-CM | POA: Diagnosis not present

## 2015-11-25 DIAGNOSIS — K219 Gastro-esophageal reflux disease without esophagitis: Secondary | ICD-10-CM | POA: Diagnosis not present

## 2016-01-13 DIAGNOSIS — M25561 Pain in right knee: Secondary | ICD-10-CM | POA: Diagnosis not present

## 2016-01-13 DIAGNOSIS — M7652 Patellar tendinitis, left knee: Secondary | ICD-10-CM | POA: Diagnosis not present

## 2016-01-13 DIAGNOSIS — M5416 Radiculopathy, lumbar region: Secondary | ICD-10-CM | POA: Diagnosis not present

## 2016-01-27 DIAGNOSIS — M7652 Patellar tendinitis, left knee: Secondary | ICD-10-CM | POA: Diagnosis not present

## 2016-01-27 DIAGNOSIS — M5416 Radiculopathy, lumbar region: Secondary | ICD-10-CM | POA: Diagnosis not present

## 2016-02-17 DIAGNOSIS — E7800 Pure hypercholesterolemia, unspecified: Secondary | ICD-10-CM | POA: Diagnosis not present

## 2016-02-17 DIAGNOSIS — E559 Vitamin D deficiency, unspecified: Secondary | ICD-10-CM | POA: Diagnosis not present

## 2016-02-17 DIAGNOSIS — M1000 Idiopathic gout, unspecified site: Secondary | ICD-10-CM | POA: Diagnosis not present

## 2016-02-17 DIAGNOSIS — R5383 Other fatigue: Secondary | ICD-10-CM | POA: Diagnosis not present

## 2016-02-17 DIAGNOSIS — K219 Gastro-esophageal reflux disease without esophagitis: Secondary | ICD-10-CM | POA: Diagnosis not present

## 2016-02-17 DIAGNOSIS — I10 Essential (primary) hypertension: Secondary | ICD-10-CM | POA: Diagnosis not present

## 2016-02-24 DIAGNOSIS — G2581 Restless legs syndrome: Secondary | ICD-10-CM | POA: Diagnosis not present

## 2016-02-24 DIAGNOSIS — K219 Gastro-esophageal reflux disease without esophagitis: Secondary | ICD-10-CM | POA: Diagnosis not present

## 2016-02-24 DIAGNOSIS — M545 Low back pain: Secondary | ICD-10-CM | POA: Diagnosis not present

## 2016-02-24 DIAGNOSIS — E559 Vitamin D deficiency, unspecified: Secondary | ICD-10-CM | POA: Diagnosis not present

## 2016-02-24 DIAGNOSIS — M1000 Idiopathic gout, unspecified site: Secondary | ICD-10-CM | POA: Diagnosis not present

## 2016-02-24 DIAGNOSIS — M7551 Bursitis of right shoulder: Secondary | ICD-10-CM | POA: Diagnosis not present

## 2016-02-24 DIAGNOSIS — I10 Essential (primary) hypertension: Secondary | ICD-10-CM | POA: Diagnosis not present

## 2016-06-01 DIAGNOSIS — Z1389 Encounter for screening for other disorder: Secondary | ICD-10-CM | POA: Diagnosis not present

## 2016-06-01 DIAGNOSIS — E559 Vitamin D deficiency, unspecified: Secondary | ICD-10-CM | POA: Diagnosis not present

## 2016-06-01 DIAGNOSIS — G2581 Restless legs syndrome: Secondary | ICD-10-CM | POA: Diagnosis not present

## 2016-06-01 DIAGNOSIS — Z6839 Body mass index (BMI) 39.0-39.9, adult: Secondary | ICD-10-CM | POA: Diagnosis not present

## 2016-06-01 DIAGNOSIS — I10 Essential (primary) hypertension: Secondary | ICD-10-CM | POA: Diagnosis not present

## 2016-06-01 DIAGNOSIS — Z23 Encounter for immunization: Secondary | ICD-10-CM | POA: Diagnosis not present

## 2016-06-01 DIAGNOSIS — K219 Gastro-esophageal reflux disease without esophagitis: Secondary | ICD-10-CM | POA: Diagnosis not present

## 2016-06-21 DIAGNOSIS — C4490 Unspecified malignant neoplasm of skin, unspecified: Secondary | ICD-10-CM | POA: Diagnosis not present

## 2016-06-21 DIAGNOSIS — C44521 Squamous cell carcinoma of skin of breast: Secondary | ICD-10-CM | POA: Diagnosis not present

## 2016-08-17 DIAGNOSIS — H26491 Other secondary cataract, right eye: Secondary | ICD-10-CM | POA: Diagnosis not present

## 2016-08-17 NOTE — Progress Notes (Signed)
Cardiology Office Note  Date: 08/18/2016   ID: TERRYLEE ARENDER, DOB 1936-01-27, MRN [id number]  PCP: [name], MD  Primary Cardiologist: [name], MD   Chief Complaint  Patient presents with  . Coronary Artery Disease    History of Present Illness: Renee Dudley is a 80 y.o. female last seen in October 2016.She presents today for a follow-up visit. States that she has had no significant chest pain or change in stamina over the last year. She continues to follow with Dr. Nadara Mustard, in fact is having a physical with lab work later this month.  I reviewed her ECG today which shows sinus bradycardia with nonspecific T-wave changes. Most recent stress testing was in 2015 as outlined below. She continue on atenolol and Lasix, has intolerance to statins.  She enjoys crafts, also cooking. Just recently hosted 59 people for Thanksgiving and did all of the preparations herself.  Past Medical History:  Diagnosis Date  . Anxiety   . Chronic headaches   . Coronary atherosclerosis of native coronary artery    Nonobstructive at cardiac catheterization 2007   . Depression   . DJD (degenerative joint disease)   . Essential hypertension, benign   . GERD (gastroesophageal reflux disease)   . History of gout   . Mixed hyperlipidemia   . Peripheral edema   . Primary localized osteoarthritis of left knee   . Varicose veins     Current Outpatient Prescriptions  Medication Sig Dispense Refill  . ALPRAZolam (XANAX) 0.25 MG tablet Take 0.5 mg by mouth daily as needed for anxiety.     [name] atenolol (TENORMIN) 50 MG tablet Take 50 mg by mouth daily.    [name] docusate sodium (COLACE) 100 MG capsule Take 400 mg by mouth at bedtime.    [name] esomeprazole (NEXIUM) 40 MG capsule Take 40 mg by mouth daily before breakfast.      . furosemide (LASIX) 40 MG tablet Take 40 mg by mouth daily.      . pramipexole (MIRAPEX) 0.25 MG tablet Take 0.25 mg by mouth at bedtime.     . ranitidine (ZANTAC) 150 MG  tablet Take 150 mg by mouth 2 (two) times daily.     . sertraline (ZOLOFT) 25 MG tablet Take 25 mg by mouth at bedtime.     . topiramate (TOPAMAX) 50 MG tablet Take 50 mg by mouth 2 (two) times daily.      [name] VITAMIN D, CHOLECALCIFEROL, PO Take 1 tablet by mouth daily.     No current facility-administered medications for this visit.    Allergies:  Penicillins; Levofloxacin; Sulfa antibiotics; and Zocor [simvastatin]   Social History: The patient  reports that she has never smoked. She has never used smokeless tobacco. She reports that she does not drink alcohol or use drugs.   ROS:  Please see the history of present illness. Otherwise, complete review of systems is positive for arthritic knee pain, restless legs.  All other systems are reviewed and negative.   Physical Exam: VS:  BP 134/74   Pulse (!) 57   Ht 5\' 6"  (1.676 m)   Wt 238 lb (108 kg)   SpO2 99%   BMI 38.41 kg/m , BMI Body mass index is 38.41 kg/m.  Wt Readings from Last 3 Encounters:  08/18/16 238 lb (108 kg)  07/14/15 222 lb 12.8 oz (101.1 kg)  03/25/15 240 lb (108.9 kg)    Overweight woman in no acute distress.  HEENT: Conjunctiva and lids  are normal, oropharnyx with moist mucosa.  Neck: Supple, no elevated JVP, no bruits, no thyromegaly.  Lungs: Clear to auscultation, nonlabored.  Cardiac: Regular rate and rhythm, no S3 gallop or rub.  Abdomen: Soft, nontender, bowel sounds present.  Musculoskeletal: No kyphosis.  Extremity: No pitting edema, some right knee swelling.  ECG: I personally reviewed the tracing from 11/28/2014 which shows sinus bradycardia with borderline low voltage.  Recent Labwork:  April 2016: Potassium 4.4, BUN 26, creatinine 1.1, hemoglobin 8.7, platelets 161  Other Studies Reviewed Today:  Lexiscan Cardiolite February 2015: No diagnostic ST segment changes, breast attenuation without clear evidence of scar or ischemia, LVEF 77%.  Carotid Dopplers 01/09/2014: 1-39% bilateral ICA  stenoses.  Assessment and Plan:  1. History of nonobstructive CAD, no active angina symptoms. Cardiolite study from 2015 was low risk. ECG reviewed and stable. Would continue with observation for now.  2. History of statin intolerance. She will be having follow-up lab work with Dr. Nadara Mustard with physical soon.  3. Essential hypertension, blood pressure control is adequate today. She continues on atenolol.  4. Mild carotid artery atherosclerosis by Dopplers in 2015.  Current medicines were reviewed with the patient today.   Orders Placed This Encounter  Procedures  . EKG 12-Lead     Disposition: Follow-up in one year.  Signed, Satira Sark, MD, [facility] 08/18/2016 2:01 PM    [facility] [address], [place], [place] [address] Phone: [phone]; Fax: [phone]

## 2016-08-18 ENCOUNTER — Encounter: Payer: Self-pay | Admitting: Cardiology

## 2016-08-18 ENCOUNTER — Ambulatory Visit (INDEPENDENT_AMBULATORY_CARE_PROVIDER_SITE_OTHER): Payer: Medicare HMO | Admitting: Cardiology

## 2016-08-18 VITALS — BP 134/74 | HR 57 | Ht 66.0 in | Wt 238.0 lb

## 2016-08-18 DIAGNOSIS — Z789 Other specified health status: Secondary | ICD-10-CM

## 2016-08-18 DIAGNOSIS — I10 Essential (primary) hypertension: Secondary | ICD-10-CM

## 2016-08-18 DIAGNOSIS — I25810 Atherosclerosis of coronary artery bypass graft(s) without angina pectoris: Secondary | ICD-10-CM | POA: Diagnosis not present

## 2016-08-18 NOTE — Patient Instructions (Signed)

## 2016-08-30 DIAGNOSIS — E559 Vitamin D deficiency, unspecified: Secondary | ICD-10-CM | POA: Diagnosis not present

## 2016-08-30 DIAGNOSIS — E7800 Pure hypercholesterolemia, unspecified: Secondary | ICD-10-CM | POA: Diagnosis not present

## 2016-08-30 DIAGNOSIS — K580 Irritable bowel syndrome with diarrhea: Secondary | ICD-10-CM | POA: Diagnosis not present

## 2016-08-30 DIAGNOSIS — G2581 Restless legs syndrome: Secondary | ICD-10-CM | POA: Diagnosis not present

## 2016-08-30 DIAGNOSIS — K219 Gastro-esophageal reflux disease without esophagitis: Secondary | ICD-10-CM | POA: Diagnosis not present

## 2016-08-30 DIAGNOSIS — I10 Essential (primary) hypertension: Secondary | ICD-10-CM | POA: Diagnosis not present

## 2016-08-30 DIAGNOSIS — G933 Postviral fatigue syndrome: Secondary | ICD-10-CM | POA: Diagnosis not present

## 2016-09-02 DIAGNOSIS — E559 Vitamin D deficiency, unspecified: Secondary | ICD-10-CM | POA: Diagnosis not present

## 2016-09-02 DIAGNOSIS — G2581 Restless legs syndrome: Secondary | ICD-10-CM | POA: Diagnosis not present

## 2016-09-02 DIAGNOSIS — I10 Essential (primary) hypertension: Secondary | ICD-10-CM | POA: Diagnosis not present

## 2016-09-02 DIAGNOSIS — M545 Low back pain: Secondary | ICD-10-CM | POA: Diagnosis not present

## 2016-09-02 DIAGNOSIS — N393 Stress incontinence (female) (male): Secondary | ICD-10-CM | POA: Diagnosis not present

## 2016-09-02 DIAGNOSIS — E7800 Pure hypercholesterolemia, unspecified: Secondary | ICD-10-CM | POA: Diagnosis not present

## 2016-09-02 DIAGNOSIS — Z0001 Encounter for general adult medical examination with abnormal findings: Secondary | ICD-10-CM | POA: Diagnosis not present

## 2016-11-02 DIAGNOSIS — M5416 Radiculopathy, lumbar region: Secondary | ICD-10-CM | POA: Diagnosis not present

## 2016-11-18 DIAGNOSIS — M25461 Effusion, right knee: Secondary | ICD-10-CM | POA: Diagnosis not present

## 2016-11-18 DIAGNOSIS — Z96653 Presence of artificial knee joint, bilateral: Secondary | ICD-10-CM | POA: Diagnosis not present

## 2016-11-19 ENCOUNTER — Other Ambulatory Visit (HOSPITAL_COMMUNITY): Payer: Self-pay | Admitting: Orthopedic Surgery

## 2016-11-19 DIAGNOSIS — M25561 Pain in right knee: Secondary | ICD-10-CM

## 2016-11-19 DIAGNOSIS — M79651 Pain in right thigh: Secondary | ICD-10-CM

## 2016-11-19 DIAGNOSIS — Z96651 Presence of right artificial knee joint: Secondary | ICD-10-CM

## 2016-11-23 ENCOUNTER — Ambulatory Visit (HOSPITAL_COMMUNITY): Payer: Medicare Other

## 2016-11-30 DIAGNOSIS — M25561 Pain in right knee: Secondary | ICD-10-CM | POA: Diagnosis not present

## 2016-11-30 DIAGNOSIS — M47816 Spondylosis without myelopathy or radiculopathy, lumbar region: Secondary | ICD-10-CM | POA: Diagnosis not present

## 2016-12-02 ENCOUNTER — Other Ambulatory Visit (HOSPITAL_COMMUNITY): Payer: Self-pay | Admitting: Orthopedic Surgery

## 2016-12-02 DIAGNOSIS — Z96651 Presence of right artificial knee joint: Secondary | ICD-10-CM | POA: Diagnosis not present

## 2016-12-02 DIAGNOSIS — M25561 Pain in right knee: Secondary | ICD-10-CM | POA: Diagnosis not present

## 2016-12-03 ENCOUNTER — Ambulatory Visit (HOSPITAL_COMMUNITY)
Admission: RE | Admit: 2016-12-03 | Discharge: 2016-12-03 | Disposition: A | Discharge status: Home / Self Care | Payer: Medicare HMO | Source: Ambulatory Visit | Attending: Orthopedic Surgery | Admitting: Orthopedic Surgery

## 2016-12-03 ENCOUNTER — Encounter (HOSPITAL_COMMUNITY): Payer: Self-pay

## 2016-12-03 DIAGNOSIS — M25561 Pain in right knee: Secondary | ICD-10-CM

## 2016-12-03 DIAGNOSIS — M67863 Other specified disorders of tendon, right knee: Secondary | ICD-10-CM | POA: Insufficient documentation

## 2016-12-03 DIAGNOSIS — M25461 Effusion, right knee: Secondary | ICD-10-CM | POA: Insufficient documentation

## 2016-12-03 DIAGNOSIS — Z96651 Presence of right artificial knee joint: Secondary | ICD-10-CM

## 2016-12-03 DIAGNOSIS — M79651 Pain in right thigh: Secondary | ICD-10-CM

## 2016-12-09 DIAGNOSIS — M5116 Intervertebral disc disorders with radiculopathy, lumbar region: Secondary | ICD-10-CM | POA: Diagnosis not present

## 2016-12-09 DIAGNOSIS — M47816 Spondylosis without myelopathy or radiculopathy, lumbar region: Secondary | ICD-10-CM | POA: Diagnosis not present

## 2016-12-09 DIAGNOSIS — M5136 Other intervertebral disc degeneration, lumbar region: Secondary | ICD-10-CM | POA: Diagnosis not present

## 2016-12-15 ENCOUNTER — Other Ambulatory Visit (HOSPITAL_COMMUNITY): Payer: Medicare Other

## 2016-12-15 ENCOUNTER — Ambulatory Visit (HOSPITAL_COMMUNITY): Payer: Medicare Other

## 2016-12-28 ENCOUNTER — Other Ambulatory Visit: Payer: Self-pay | Admitting: Physical Medicine and Rehabilitation

## 2016-12-28 DIAGNOSIS — M47816 Spondylosis without myelopathy or radiculopathy, lumbar region: Secondary | ICD-10-CM | POA: Diagnosis not present

## 2016-12-28 DIAGNOSIS — M545 Low back pain: Secondary | ICD-10-CM

## 2016-12-28 DIAGNOSIS — M5136 Other intervertebral disc degeneration, lumbar region: Secondary | ICD-10-CM | POA: Diagnosis not present

## 2016-12-28 DIAGNOSIS — M5116 Intervertebral disc disorders with radiculopathy, lumbar region: Secondary | ICD-10-CM | POA: Diagnosis not present

## 2017-01-08 ENCOUNTER — Other Ambulatory Visit: Payer: Medicare Other

## 2017-01-12 ENCOUNTER — Ambulatory Visit
Admission: RE | Admit: 2017-01-12 | Discharge: 2017-01-12 | Disposition: A | Discharge status: Home / Self Care | Payer: Medicare HMO | Source: Ambulatory Visit | Attending: Physical Medicine and Rehabilitation | Admitting: Physical Medicine and Rehabilitation

## 2017-01-12 ENCOUNTER — Other Ambulatory Visit: Payer: Medicare Other

## 2017-01-12 DIAGNOSIS — M48061 Spinal stenosis, lumbar region without neurogenic claudication: Secondary | ICD-10-CM | POA: Diagnosis not present

## 2017-01-12 DIAGNOSIS — M545 Low back pain: Secondary | ICD-10-CM

## 2017-01-18 DIAGNOSIS — M5136 Other intervertebral disc degeneration, lumbar region: Secondary | ICD-10-CM | POA: Diagnosis not present

## 2017-01-18 DIAGNOSIS — M5116 Intervertebral disc disorders with radiculopathy, lumbar region: Secondary | ICD-10-CM | POA: Diagnosis not present

## 2017-01-18 DIAGNOSIS — M47816 Spondylosis without myelopathy or radiculopathy, lumbar region: Secondary | ICD-10-CM | POA: Diagnosis not present

## 2017-01-21 DIAGNOSIS — M5116 Intervertebral disc disorders with radiculopathy, lumbar region: Secondary | ICD-10-CM | POA: Diagnosis not present

## 2017-01-21 DIAGNOSIS — M5136 Other intervertebral disc degeneration, lumbar region: Secondary | ICD-10-CM | POA: Diagnosis not present

## 2017-01-21 DIAGNOSIS — M47816 Spondylosis without myelopathy or radiculopathy, lumbar region: Secondary | ICD-10-CM | POA: Diagnosis not present

## 2017-02-08 DIAGNOSIS — M5126 Other intervertebral disc displacement, lumbar region: Secondary | ICD-10-CM | POA: Diagnosis not present

## 2017-02-09 ENCOUNTER — Other Ambulatory Visit: Payer: Self-pay | Admitting: Neurosurgery

## 2017-02-18 NOTE — Pre-Procedure Instructions (Signed)
    Renee Dudley  02/18/2017      [id number] - [place],  - [place] [address] [place] [address] Phone: [phone] Fax: [phone]    Your procedure is scheduled on 02/24/17.  Report to [facility] Admitting at 215 pm  Call this number if you have problems the morning of surgery:  [phone]   Remember:  Do not eat food or drink liquids after midnight.  Take these medicines the morning of surgery with A SIP OF WATER --xanax,atenolol,hydrocodone,zantac,topamax   Do not wear jewelry, make-up or nail polish.  Do not wear lotions, powders, or perfumes, or deoderant.  Do not shave 48 hours prior to surgery.  Men may shave face and neck.  Do not bring valuables to the hospital.  [name] is not responsible for any belongings or valuables.  Contacts, dentures or bridgework may not be worn into surgery.  Leave your suitcase in the car.  After surgery it may be brought to your room.  For patients admitted to the hospital, discharge time will be determined by your treatment team.  Patients discharged the day of surgery will not be allowed to drive home.   Name and phone number of your driver:    Special instructions:  Do not take any aspirin,anti-inflammatories,vitamins,or herbal supplements 5-7 days prior to surgery.  Please read over the following fact sheets that you were given. MRSA Information

## 2017-02-21 ENCOUNTER — Encounter (HOSPITAL_COMMUNITY)
Admission: RE | Admit: 2017-02-21 | Discharge: 2017-02-21 | Disposition: A | Discharge status: Home / Self Care | Payer: Medicare HMO | Source: Ambulatory Visit | Attending: Neurosurgery | Admitting: Neurosurgery

## 2017-02-21 ENCOUNTER — Encounter (HOSPITAL_COMMUNITY): Payer: Self-pay

## 2017-02-21 DIAGNOSIS — I10 Essential (primary) hypertension: Secondary | ICD-10-CM | POA: Diagnosis not present

## 2017-02-21 DIAGNOSIS — Z881 Allergy status to other antibiotic agents status: Secondary | ICD-10-CM | POA: Diagnosis not present

## 2017-02-21 DIAGNOSIS — Z880 Allergy status to penicillin: Secondary | ICD-10-CM | POA: Diagnosis not present

## 2017-02-21 DIAGNOSIS — M5116 Intervertebral disc disorders with radiculopathy, lumbar region: Secondary | ICD-10-CM | POA: Diagnosis present

## 2017-02-21 DIAGNOSIS — F419 Anxiety disorder, unspecified: Secondary | ICD-10-CM | POA: Diagnosis not present

## 2017-02-21 DIAGNOSIS — R51 Headache: Secondary | ICD-10-CM | POA: Diagnosis not present

## 2017-02-21 DIAGNOSIS — Z791 Long term (current) use of non-steroidal anti-inflammatories (NSAID): Secondary | ICD-10-CM | POA: Diagnosis not present

## 2017-02-21 DIAGNOSIS — Z882 Allergy status to sulfonamides status: Secondary | ICD-10-CM | POA: Diagnosis not present

## 2017-02-21 DIAGNOSIS — Z79891 Long term (current) use of opiate analgesic: Secondary | ICD-10-CM | POA: Diagnosis not present

## 2017-02-21 DIAGNOSIS — F329 Major depressive disorder, single episode, unspecified: Secondary | ICD-10-CM | POA: Diagnosis not present

## 2017-02-21 DIAGNOSIS — Z888 Allergy status to other drugs, medicaments and biological substances status: Secondary | ICD-10-CM | POA: Diagnosis not present

## 2017-02-21 DIAGNOSIS — R69 Illness, unspecified: Secondary | ICD-10-CM | POA: Diagnosis not present

## 2017-02-21 DIAGNOSIS — Z96652 Presence of left artificial knee joint: Secondary | ICD-10-CM | POA: Diagnosis not present

## 2017-02-21 DIAGNOSIS — I2510 Atherosclerotic heart disease of native coronary artery without angina pectoris: Secondary | ICD-10-CM | POA: Diagnosis not present

## 2017-02-21 DIAGNOSIS — Z79899 Other long term (current) drug therapy: Secondary | ICD-10-CM | POA: Diagnosis not present

## 2017-02-21 DIAGNOSIS — K219 Gastro-esophageal reflux disease without esophagitis: Secondary | ICD-10-CM | POA: Diagnosis not present

## 2017-02-21 HISTORY — DX: Dyspnea, unspecified: R06.00

## 2017-02-21 LAB — SURGICAL PCR SCREEN
MRSA, PCR: NEGATIVE
STAPHYLOCOCCUS AUREUS: NEGATIVE

## 2017-02-23 ENCOUNTER — Ambulatory Visit (HOSPITAL_COMMUNITY): Payer: Medicare HMO | Admitting: Certified Registered"

## 2017-02-23 ENCOUNTER — Ambulatory Visit (HOSPITAL_COMMUNITY): Payer: Medicare HMO

## 2017-02-23 ENCOUNTER — Encounter (HOSPITAL_COMMUNITY): Payer: Self-pay

## 2017-02-23 ENCOUNTER — Encounter (HOSPITAL_COMMUNITY)
Admission: RE | Disposition: A | Discharge status: Home / Self Care | Payer: Self-pay | Source: Ambulatory Visit | Attending: Neurosurgery

## 2017-02-23 ENCOUNTER — Ambulatory Visit (HOSPITAL_COMMUNITY)
Admission: RE | Admit: 2017-02-23 | Discharge: 2017-02-24 | Disposition: A | Discharge status: Home / Self Care | Payer: Medicare HMO | Source: Ambulatory Visit | Attending: Neurosurgery | Admitting: Neurosurgery

## 2017-02-23 DIAGNOSIS — F419 Anxiety disorder, unspecified: Secondary | ICD-10-CM | POA: Insufficient documentation

## 2017-02-23 DIAGNOSIS — Z79899 Other long term (current) drug therapy: Secondary | ICD-10-CM | POA: Insufficient documentation

## 2017-02-23 DIAGNOSIS — E782 Mixed hyperlipidemia: Secondary | ICD-10-CM | POA: Diagnosis not present

## 2017-02-23 DIAGNOSIS — Z9889 Other specified postprocedural states: Secondary | ICD-10-CM | POA: Diagnosis not present

## 2017-02-23 DIAGNOSIS — M5116 Intervertebral disc disorders with radiculopathy, lumbar region: Secondary | ICD-10-CM | POA: Insufficient documentation

## 2017-02-23 DIAGNOSIS — R51 Headache: Secondary | ICD-10-CM | POA: Diagnosis not present

## 2017-02-23 DIAGNOSIS — Z96652 Presence of left artificial knee joint: Secondary | ICD-10-CM | POA: Insufficient documentation

## 2017-02-23 DIAGNOSIS — M5126 Other intervertebral disc displacement, lumbar region: Secondary | ICD-10-CM | POA: Diagnosis not present

## 2017-02-23 DIAGNOSIS — I2510 Atherosclerotic heart disease of native coronary artery without angina pectoris: Secondary | ICD-10-CM | POA: Diagnosis not present

## 2017-02-23 DIAGNOSIS — K219 Gastro-esophageal reflux disease without esophagitis: Secondary | ICD-10-CM | POA: Insufficient documentation

## 2017-02-23 DIAGNOSIS — M5136 Other intervertebral disc degeneration, lumbar region: Secondary | ICD-10-CM | POA: Diagnosis not present

## 2017-02-23 DIAGNOSIS — Z978 Presence of other specified devices: Secondary | ICD-10-CM | POA: Diagnosis not present

## 2017-02-23 DIAGNOSIS — Z419 Encounter for procedure for purposes other than remedying health state, unspecified: Secondary | ICD-10-CM

## 2017-02-23 DIAGNOSIS — Z881 Allergy status to other antibiotic agents status: Secondary | ICD-10-CM | POA: Insufficient documentation

## 2017-02-23 DIAGNOSIS — F329 Major depressive disorder, single episode, unspecified: Secondary | ICD-10-CM | POA: Insufficient documentation

## 2017-02-23 DIAGNOSIS — Z79891 Long term (current) use of opiate analgesic: Secondary | ICD-10-CM | POA: Diagnosis not present

## 2017-02-23 DIAGNOSIS — Z888 Allergy status to other drugs, medicaments and biological substances status: Secondary | ICD-10-CM | POA: Insufficient documentation

## 2017-02-23 DIAGNOSIS — I10 Essential (primary) hypertension: Secondary | ICD-10-CM | POA: Diagnosis not present

## 2017-02-23 DIAGNOSIS — Z880 Allergy status to penicillin: Secondary | ICD-10-CM | POA: Insufficient documentation

## 2017-02-23 DIAGNOSIS — Z791 Long term (current) use of non-steroidal anti-inflammatories (NSAID): Secondary | ICD-10-CM | POA: Diagnosis not present

## 2017-02-23 DIAGNOSIS — R69 Illness, unspecified: Secondary | ICD-10-CM | POA: Diagnosis not present

## 2017-02-23 DIAGNOSIS — Z882 Allergy status to sulfonamides status: Secondary | ICD-10-CM | POA: Insufficient documentation

## 2017-02-23 HISTORY — PX: LUMBAR LAMINECTOMY/DECOMPRESSION MICRODISCECTOMY: SHX5026

## 2017-02-23 LAB — BASIC METABOLIC PANEL
Anion gap: 9 (ref 5–15)
BUN: 19 mg/dL (ref 6–20)
CALCIUM: 8.9 mg/dL (ref 8.9–10.3)
CHLORIDE: 108 mmol/L (ref 101–111)
CO2: 23 mmol/L (ref 22–32)
CREATININE: 1.07 mg/dL — AB (ref 0.44–1.00)
GFR calc non Af Amer: 48 mL/min — ABNORMAL LOW (ref >60)
GFR, EST AFRICAN AMERICAN: 56 mL/min — AB (ref >60)
Glucose, Bld: 92 mg/dL (ref 65–99)
Potassium: 3.8 mmol/L (ref 3.5–5.1)
SODIUM: 140 mmol/L (ref 135–145)

## 2017-02-23 LAB — CBC
HEMATOCRIT: 37.7 % (ref 36.0–46.0)
Hemoglobin: 11.9 g/dL — ABNORMAL LOW (ref 12.0–15.0)
MCH: 30.1 pg (ref 26.0–34.0)
MCHC: 31.6 g/dL (ref 30.0–36.0)
MCV: 95.2 fL (ref 78.0–100.0)
Platelets: 211 10*3/uL (ref 150–400)
RBC: 3.96 MIL/uL (ref 3.87–5.11)
RDW: 14.3 % (ref 11.5–15.5)
WBC: 6.6 10*3/uL (ref 4.0–10.5)

## 2017-02-23 SURGERY — LUMBAR LAMINECTOMY/DECOMPRESSION MICRODISCECTOMY 1 LEVEL
Anesthesia: General | Site: Spine Lumbar | Laterality: Right

## 2017-02-23 MED ORDER — OXYCODONE HCL 5 MG/5ML PO SOLN: 5.0000 mg | Freq: Once | ORAL | Status: DC | PRN

## 2017-02-23 MED ORDER — MIDAZOLAM HCL 2 MG/2ML IJ SOLN
INTRAMUSCULAR | Status: AC
  Filled 2017-02-23: qty 2

## 2017-02-23 MED ORDER — MENTHOL 3 MG MT LOZG: 1.0000 | LOZENGE | OROMUCOSAL | Status: DC | PRN

## 2017-02-23 MED ORDER — HYDROMORPHONE HCL 1 MG/ML IJ SOLN: 0.5000 mg | INTRAMUSCULAR | Status: DC | PRN

## 2017-02-23 MED ORDER — ATENOLOL 50 MG PO TABS
50.0000 mg | ORAL_TABLET | Freq: Every day | ORAL | Status: DC
  Administered 2017-02-24: 50 mg via ORAL
  Filled 2017-02-23: qty 1

## 2017-02-23 MED ORDER — ALPRAZOLAM 0.5 MG PO TABS
0.5000 mg | ORAL_TABLET | Freq: Every evening | ORAL | Status: DC | PRN
  Administered 2017-02-24: 0.5 mg via ORAL
  Filled 2017-02-23: qty 1

## 2017-02-23 MED ORDER — LIDOCAINE-EPINEPHRINE 1 %-1:100000 IJ SOLN
INTRAMUSCULAR | Status: AC
  Filled 2017-02-23: qty 1

## 2017-02-23 MED ORDER — OXYCODONE HCL 5 MG PO TABS
5.0000 mg | ORAL_TABLET | ORAL | Status: DC | PRN
  Administered 2017-02-23 – 2017-02-24 (×3): 10 mg via ORAL
  Filled 2017-02-23 (×2): qty 2

## 2017-02-23 MED ORDER — ALIGN 4 MG PO CAPS: 4.0000 mg | ORAL_CAPSULE | Freq: Every day | ORAL | Status: DC

## 2017-02-23 MED ORDER — VANCOMYCIN HCL IN DEXTROSE 1-5 GM/200ML-% IV SOLN: 1000.0000 mg | INTRAVENOUS | Status: DC

## 2017-02-23 MED ORDER — FAMOTIDINE 20 MG PO TABS
10.0000 mg | ORAL_TABLET | Freq: Every day | ORAL | Status: DC
  Administered 2017-02-24: 10 mg via ORAL
  Filled 2017-02-23: qty 1

## 2017-02-23 MED ORDER — LACTATED RINGERS IV SOLN
INTRAVENOUS | Status: DC
  Administered 2017-02-23 (×2): via INTRAVENOUS

## 2017-02-23 MED ORDER — PHENYLEPHRINE 40 MCG/ML (10ML) SYRINGE FOR IV PUSH (FOR BLOOD PRESSURE SUPPORT)
PREFILLED_SYRINGE | INTRAVENOUS | Status: DC | PRN
  Administered 2017-02-23 (×2): 80 ug via INTRAVENOUS

## 2017-02-23 MED ORDER — CHLORHEXIDINE GLUCONATE CLOTH 2 % EX PADS: 6.0000 | MEDICATED_PAD | Freq: Once | CUTANEOUS | Status: DC

## 2017-02-23 MED ORDER — SODIUM CHLORIDE 0.9 % IV SOLN
1500.0000 mg | INTRAVENOUS | Status: AC
  Administered 2017-02-23: 1500 mg via INTRAVENOUS
  Filled 2017-02-23: qty 1500

## 2017-02-23 MED ORDER — SODIUM CHLORIDE 0.9 % IV SOLN
250.0000 mL | INTRAVENOUS | Status: DC
  Administered 2017-02-23: 250 mL via INTRAVENOUS

## 2017-02-23 MED ORDER — ROCURONIUM BROMIDE 10 MG/ML (PF) SYRINGE
PREFILLED_SYRINGE | INTRAVENOUS | Status: DC | PRN
  Administered 2017-02-23: 50 mg via INTRAVENOUS

## 2017-02-23 MED ORDER — HYDROCODONE-ACETAMINOPHEN 5-325 MG PO TABS
0.5000 | ORAL_TABLET | Freq: Every day | ORAL | Status: DC
  Administered 2017-02-23: 0.5 via ORAL
  Filled 2017-02-23: qty 1

## 2017-02-23 MED ORDER — BUPIVACAINE HCL (PF) 0.25 % IJ SOLN
INTRAMUSCULAR | Status: AC
  Filled 2017-02-23: qty 30

## 2017-02-23 MED ORDER — LIDOCAINE 2% (20 MG/ML) 5 ML SYRINGE
INTRAMUSCULAR | Status: DC | PRN
  Administered 2017-02-23: 40 mg via INTRAVENOUS

## 2017-02-23 MED ORDER — PHENOL 1.4 % MT LIQD: 1.0000 | OROMUCOSAL | Status: DC | PRN

## 2017-02-23 MED ORDER — ONDANSETRON HCL 4 MG/2ML IJ SOLN
INTRAMUSCULAR | Status: DC | PRN
  Administered 2017-02-23: 4 mg via INTRAVENOUS

## 2017-02-23 MED ORDER — SUFENTANIL CITRATE 50 MCG/ML IV SOLN
INTRAVENOUS | Status: DC | PRN
  Administered 2017-02-23: 10 ug via INTRAVENOUS
  Administered 2017-02-23: 20 ug via INTRAVENOUS

## 2017-02-23 MED ORDER — ONDANSETRON HCL 4 MG/2ML IJ SOLN: 4.0000 mg | Freq: Four times a day (QID) | INTRAMUSCULAR | Status: DC | PRN

## 2017-02-23 MED ORDER — ONDANSETRON HCL 4 MG/2ML IJ SOLN: 4.0000 mg | Freq: Once | INTRAMUSCULAR | Status: DC | PRN

## 2017-02-23 MED ORDER — OXYCODONE HCL 5 MG PO TABS
ORAL_TABLET | ORAL | Status: AC
  Filled 2017-02-23: qty 2

## 2017-02-23 MED ORDER — SODIUM CHLORIDE 0.9% FLUSH
3.0000 mL | Freq: Two times a day (BID) | INTRAVENOUS | Status: DC
  Administered 2017-02-23: 3 mL via INTRAVENOUS

## 2017-02-23 MED ORDER — SUFENTANIL CITRATE 50 MCG/ML IV SOLN
INTRAVENOUS | Status: AC
  Filled 2017-02-23: qty 1

## 2017-02-23 MED ORDER — PROPOFOL 10 MG/ML IV BOLUS
INTRAVENOUS | Status: DC | PRN
  Administered 2017-02-23: 135 mg via INTRAVENOUS

## 2017-02-23 MED ORDER — TOPIRAMATE 25 MG PO TABS
50.0000 mg | ORAL_TABLET | Freq: Every day | ORAL | Status: DC
  Administered 2017-02-24: 50 mg via ORAL
  Filled 2017-02-23: qty 2

## 2017-02-23 MED ORDER — OXYCODONE HCL 5 MG PO TABS
5.0000 mg | ORAL_TABLET | Freq: Once | ORAL | Status: DC | PRN
Start: 2017-02-23 — End: 2017-02-23

## 2017-02-23 MED ORDER — RISAQUAD PO CAPS
1.0000 | ORAL_CAPSULE | Freq: Every day | ORAL | Status: DC
  Administered 2017-02-24: 1 via ORAL
  Filled 2017-02-23: qty 1

## 2017-02-23 MED ORDER — CYCLOBENZAPRINE HCL 10 MG PO TABS
ORAL_TABLET | ORAL | Status: AC
  Administered 2017-02-23: 10 mg via ORAL
  Filled 2017-02-23: qty 1

## 2017-02-23 MED ORDER — CYCLOBENZAPRINE HCL 10 MG PO TABS
10.0000 mg | ORAL_TABLET | Freq: Three times a day (TID) | ORAL | Status: DC | PRN
  Administered 2017-02-23: 10 mg via ORAL

## 2017-02-23 MED ORDER — ROCURONIUM BROMIDE 10 MG/ML (PF) SYRINGE
PREFILLED_SYRINGE | INTRAVENOUS | Status: AC
  Filled 2017-02-23: qty 5

## 2017-02-23 MED ORDER — PRAMIPEXOLE DIHYDROCHLORIDE 0.25 MG PO TABS
0.2500 mg | ORAL_TABLET | Freq: Every day | ORAL | Status: DC
  Administered 2017-02-23: 0.25 mg via ORAL
  Filled 2017-02-23: qty 1

## 2017-02-23 MED ORDER — ACETAMINOPHEN 325 MG PO TABS: 650.0000 mg | ORAL_TABLET | ORAL | Status: DC | PRN

## 2017-02-23 MED ORDER — VANCOMYCIN HCL IN DEXTROSE 1-5 GM/200ML-% IV SOLN
INTRAVENOUS | Status: AC
  Filled 2017-02-23: qty 200

## 2017-02-23 MED ORDER — THROMBIN 5000 UNITS EX SOLR
CUTANEOUS | Status: DC | PRN
  Administered 2017-02-23 (×2): 5000 [IU] via TOPICAL

## 2017-02-23 MED ORDER — FUROSEMIDE 40 MG PO TABS
40.0000 mg | ORAL_TABLET | Freq: Every day | ORAL | Status: DC
  Filled 2017-02-23: qty 1

## 2017-02-23 MED ORDER — SODIUM CHLORIDE 0.9% FLUSH: 3.0000 mL | INTRAVENOUS | Status: DC | PRN

## 2017-02-23 MED ORDER — ONDANSETRON HCL 4 MG PO TABS: 4.0000 mg | ORAL_TABLET | Freq: Four times a day (QID) | ORAL | Status: DC | PRN

## 2017-02-23 MED ORDER — ALUM & MAG HYDROXIDE-SIMETH 200-200-20 MG/5ML PO SUSP: 30.0000 mL | Freq: Four times a day (QID) | ORAL | Status: DC | PRN

## 2017-02-23 MED ORDER — LIDOCAINE 2% (20 MG/ML) 5 ML SYRINGE
INTRAMUSCULAR | Status: AC
  Filled 2017-02-23: qty 5

## 2017-02-23 MED ORDER — ONDANSETRON HCL 4 MG/2ML IJ SOLN
INTRAMUSCULAR | Status: AC
  Filled 2017-02-23: qty 2

## 2017-02-23 MED ORDER — HEMOSTATIC AGENTS (NO CHARGE) OPTIME
TOPICAL | Status: DC | PRN
  Administered 2017-02-23: 1 via TOPICAL

## 2017-02-23 MED ORDER — DEXAMETHASONE SODIUM PHOSPHATE 10 MG/ML IJ SOLN
INTRAMUSCULAR | Status: AC
  Filled 2017-02-23: qty 1

## 2017-02-23 MED ORDER — VANCOMYCIN HCL IN DEXTROSE 1-5 GM/200ML-% IV SOLN
1000.0000 mg | Freq: Once | INTRAVENOUS | Status: AC
  Administered 2017-02-24: 1000 mg via INTRAVENOUS
  Filled 2017-02-23: qty 200

## 2017-02-23 MED ORDER — DEXAMETHASONE SODIUM PHOSPHATE 10 MG/ML IJ SOLN
10.0000 mg | INTRAMUSCULAR | Status: AC
  Administered 2017-02-23: 10 mg via INTRAVENOUS

## 2017-02-23 MED ORDER — OXYCODONE HCL 5 MG PO TABS: 5.0000 mg | ORAL_TABLET | Freq: Once | ORAL | Status: DC | PRN

## 2017-02-23 MED ORDER — SODIUM CHLORIDE 0.9 % IR SOLN
Status: DC | PRN
  Administered 2017-02-23: 16:00:00

## 2017-02-23 MED ORDER — ACETAMINOPHEN 650 MG RE SUPP: 650.0000 mg | RECTAL | Status: DC | PRN

## 2017-02-23 MED ORDER — MIDAZOLAM HCL 5 MG/5ML IJ SOLN
INTRAMUSCULAR | Status: DC | PRN
  Administered 2017-02-23: 1 mg via INTRAVENOUS

## 2017-02-23 MED ORDER — DOCUSATE SODIUM 100 MG PO CAPS
400.0000 mg | ORAL_CAPSULE | Freq: Every day | ORAL | Status: DC
  Administered 2017-02-23: 200 mg via ORAL
  Filled 2017-02-23: qty 4

## 2017-02-23 MED ORDER — THROMBIN 5000 UNITS EX SOLR
CUTANEOUS | Status: AC
  Filled 2017-02-23: qty 10000

## 2017-02-23 MED ORDER — FENTANYL CITRATE (PF) 100 MCG/2ML IJ SOLN: 25.0000 ug | INTRAMUSCULAR | Status: DC | PRN

## 2017-02-23 MED ORDER — SERTRALINE HCL 50 MG PO TABS
25.0000 mg | ORAL_TABLET | Freq: Every day | ORAL | Status: DC
  Administered 2017-02-23: 25 mg via ORAL
  Filled 2017-02-23: qty 1

## 2017-02-23 MED ORDER — 0.9 % SODIUM CHLORIDE (POUR BTL) OPTIME
TOPICAL | Status: DC | PRN
  Administered 2017-02-23: 1000 mL

## 2017-02-23 MED ORDER — FENTANYL CITRATE (PF) 100 MCG/2ML IJ SOLN
INTRAMUSCULAR | Status: AC
  Administered 2017-02-23: 50 ug via INTRAVENOUS
  Filled 2017-02-23: qty 2

## 2017-02-23 MED ORDER — FENTANYL CITRATE (PF) 100 MCG/2ML IJ SOLN
25.0000 ug | INTRAMUSCULAR | Status: DC | PRN
  Administered 2017-02-23 (×2): 50 ug via INTRAVENOUS

## 2017-02-23 MED ORDER — VITAMIN D 1000 UNITS PO TABS
2000.0000 [IU] | ORAL_TABLET | Freq: Every day | ORAL | Status: DC
  Administered 2017-02-24: 2000 [IU] via ORAL
  Filled 2017-02-23: qty 2

## 2017-02-23 MED ORDER — PANTOPRAZOLE SODIUM 40 MG IV SOLR
40.0000 mg | Freq: Every day | INTRAVENOUS | Status: DC
  Administered 2017-02-23: 40 mg via INTRAVENOUS
  Filled 2017-02-23: qty 40

## 2017-02-23 MED ORDER — PROPOFOL 10 MG/ML IV BOLUS
INTRAVENOUS | Status: AC
  Filled 2017-02-23: qty 20

## 2017-02-23 MED ORDER — MELOXICAM 7.5 MG PO TABS
15.0000 mg | ORAL_TABLET | Freq: Every day | ORAL | Status: DC
  Administered 2017-02-24: 15 mg via ORAL
  Filled 2017-02-23: qty 2

## 2017-02-23 MED ORDER — LIDOCAINE-EPINEPHRINE 1 %-1:100000 IJ SOLN
INTRAMUSCULAR | Status: DC | PRN
  Administered 2017-02-23: 10 mL

## 2017-02-23 SURGICAL SUPPLY — 56 items
ADH SKN CLS APL DERMABOND .7 (GAUZE/BANDAGES/DRESSINGS) ×1
APL SKNCLS STERI-STRIP NONHPOA (GAUZE/BANDAGES/DRESSINGS) ×1
BAG DECANTER FOR FLEXI CONT (MISCELLANEOUS) ×2 IMPLANT
BENZOIN TINCTURE PRP APPL 2/3 (GAUZE/BANDAGES/DRESSINGS) ×2 IMPLANT
BLADE CLIPPER SURG (BLADE) IMPLANT
BLADE SURG 11 STRL SS (BLADE) ×2 IMPLANT
BUR CUTTER 7.0 ROUND (BURR) ×2 IMPLANT
BUR MATCHSTICK NEURO 3.0 LAGG (BURR) ×2 IMPLANT
CANISTER SUCT 3000ML PPV (MISCELLANEOUS) ×2 IMPLANT
CARTRIDGE OIL MAESTRO DRILL (MISCELLANEOUS) ×1 IMPLANT
DECANTER SPIKE VIAL GLASS SM (MISCELLANEOUS) ×2 IMPLANT
DERMABOND ADVANCED (GAUZE/BANDAGES/DRESSINGS) ×1
DERMABOND ADVANCED .7 DNX12 (GAUZE/BANDAGES/DRESSINGS) ×1 IMPLANT
DIFFUSER DRILL AIR PNEUMATIC (MISCELLANEOUS) ×2 IMPLANT
DRAPE HALF SHEET 40X57 (DRAPES) IMPLANT
DRAPE LAPAROTOMY 100X72X124 (DRAPES) ×2 IMPLANT
DRAPE MICROSCOPE LEICA (MISCELLANEOUS) ×2 IMPLANT
DRAPE POUCH INSTRU U-SHP 10X18 (DRAPES) ×2 IMPLANT
DRAPE SURG 17X23 STRL (DRAPES) ×2 IMPLANT
DRSG OPSITE POSTOP 4X6 (GAUZE/BANDAGES/DRESSINGS) ×1 IMPLANT
DURAPREP 26ML APPLICATOR (WOUND CARE) ×2 IMPLANT
ELECT REM PT RETURN 9FT ADLT (ELECTROSURGICAL) ×2
ELECTRODE REM PT RTRN 9FT ADLT (ELECTROSURGICAL) ×1 IMPLANT
GAUZE SPONGE 4X4 12PLY STRL (GAUZE/BANDAGES/DRESSINGS) ×2 IMPLANT
GAUZE SPONGE 4X4 16PLY XRAY LF (GAUZE/BANDAGES/DRESSINGS) IMPLANT
GLOVE BIO SURGEON STRL SZ7 (GLOVE) IMPLANT
GLOVE BIO SURGEON STRL SZ8 (GLOVE) ×2 IMPLANT
GLOVE BIOGEL PI IND STRL 7.0 (GLOVE) IMPLANT
GLOVE BIOGEL PI INDICATOR 7.0 (GLOVE)
GLOVE EXAM NITRILE LRG STRL (GLOVE) IMPLANT
GLOVE EXAM NITRILE XL STR (GLOVE) IMPLANT
GLOVE EXAM NITRILE XS STR PU (GLOVE) IMPLANT
GLOVE INDICATOR 8.5 STRL (GLOVE) ×2 IMPLANT
GOWN STRL REUS W/ TWL LRG LVL3 (GOWN DISPOSABLE) ×1 IMPLANT
GOWN STRL REUS W/ TWL XL LVL3 (GOWN DISPOSABLE) ×2 IMPLANT
GOWN STRL REUS W/TWL 2XL LVL3 (GOWN DISPOSABLE) IMPLANT
GOWN STRL REUS W/TWL LRG LVL3 (GOWN DISPOSABLE) ×2
GOWN STRL REUS W/TWL XL LVL3 (GOWN DISPOSABLE) ×4
KIT BASIN OR (CUSTOM PROCEDURE TRAY) ×2 IMPLANT
KIT ROOM TURNOVER OR (KITS) ×2 IMPLANT
NDL SPNL 22GX3.5 QUINCKE BK (NEEDLE) ×1 IMPLANT
NEEDLE HYPO 22GX1.5 SAFETY (NEEDLE) ×2 IMPLANT
NEEDLE SPNL 22GX3.5 QUINCKE BK (NEEDLE) ×2 IMPLANT
NS IRRIG 1000ML POUR BTL (IV SOLUTION) ×2 IMPLANT
OIL CARTRIDGE MAESTRO DRILL (MISCELLANEOUS) ×2
PACK LAMINECTOMY NEURO (CUSTOM PROCEDURE TRAY) ×2 IMPLANT
RUBBERBAND STERILE (MISCELLANEOUS) ×4 IMPLANT
SPONGE SURGIFOAM ABS GEL SZ50 (HEMOSTASIS) ×2 IMPLANT
STRIP CLOSURE SKIN 1/2X4 (GAUZE/BANDAGES/DRESSINGS) ×2 IMPLANT
SUT VIC AB 0 CT1 18XCR BRD8 (SUTURE) ×1 IMPLANT
SUT VIC AB 0 CT1 8-18 (SUTURE) ×2
SUT VIC AB 2-0 CT1 18 (SUTURE) ×2 IMPLANT
SUT VICRYL 4-0 PS2 18IN ABS (SUTURE) ×2 IMPLANT
TOWEL GREEN STERILE (TOWEL DISPOSABLE) ×2 IMPLANT
TOWEL GREEN STERILE FF (TOWEL DISPOSABLE) ×2 IMPLANT
WATER STERILE IRR 1000ML POUR (IV SOLUTION) ×2 IMPLANT

## 2017-02-23 NOTE — H&P (Signed)
Renee Dudley is an 81 y.o. female.   Chief Complaint: Back and right leg pain HPI: 81 year old female with back and right leg pain workup revealed a very large disc herniation at L2-3 on the right displacing the right L3 nerve against the right L3 pedicle. Due to her failure conservative treatment imaging findings and progressive clinical syndrome I recommended laminectomy microdiscectomy at L2-3 on the right. I extensively went over the risks and benefits of that operation with her as well as perioperative course expectations of outcome and alternatives surgery and she understands and agrees to proceed forward.  Past Medical History:  Diagnosis Date  . Anxiety   . Chronic headaches   . Coronary atherosclerosis of native coronary artery    Nonobstructive at cardiac catheterization 2007   . Depression   . DJD (degenerative joint disease)   . Dyspnea   . Essential hypertension, benign   . GERD (gastroesophageal reflux disease)   . History of gout   . Mixed hyperlipidemia   . Peripheral edema   . Primary localized osteoarthritis of left knee   . Varicose veins     Past Surgical History:  Procedure Laterality Date  . APPENDECTOMY    . Cataract surgery Bilateral   . CHOLECYSTECTOMY  1986  . COLONOSCOPY    . Right total knee arthroplasty  2011  . TOTAL ABDOMINAL HYSTERECTOMY W/ BILATERAL SALPINGOOPHORECTOMY  1988  . TOTAL KNEE ARTHROPLASTY Left 12/23/2014   Procedure: LEFT TOTAL KNEE ARTHROPLASTY;  Surgeon: Elsie Saas, MD;  Location: [facility];  Service: Orthopedics;  Laterality: Left;    Family History  Problem Relation Age of Onset  . Stroke Sister        Died in her 45's  . Stroke Father        Died age 12  . Heart failure Mother        Died age 57  . Stroke Mother    Social History:  reports that she has never smoked. She has never used smokeless tobacco. She reports that she does not drink alcohol or use drugs.  Allergies:  Allergies  Allergen Reactions  .  Penicillins Other (See Comments)    Pt can remember, happened 50 years ago Has patient had a PCN reaction causing immediate rash, facial/tongue/throat swelling, SOB or lightheadedness with hypotension: Unknown Has patient had a PCN reaction causing severe rash involving mucus membranes or skin necrosis: Unknown Has patient had a PCN reaction that required hospitalization:was inpatient at time of reaction Has patient had a PCN reaction occurring within the last 10 years: No If all of the above answers are "NO", then may proceed with C  . Levofloxacin     Weakness   . Zocor [Simvastatin]     UNSPECIFIED REACTION   . Sulfa Antibiotics Nausea And Vomiting    Medications Prior to Admission  Medication Sig Dispense Refill  . ALPRAZolam (XANAX) 0.25 MG tablet Take 0.5 mg by mouth at bedtime as needed for sleep.     [name] atenolol (TENORMIN) 50 MG tablet Take 50 mg by mouth daily.    . cholecalciferol (VITAMIN D) 1000 units tablet Take 2,000 Units by mouth daily.    . diclofenac sodium (VOLTAREN) 1 % GEL Apply 1 g topically 2 (two) times daily as needed. For pain.  12  . docusate sodium (COLACE) 100 MG capsule Take 400 mg by mouth at bedtime.    . furosemide (LASIX) 40 MG tablet Take 40 mg by mouth daily.      [name]  HYDROcodone-acetaminophen (NORCO/VICODIN) 5-325 MG tablet Take 0.5 tablets by mouth at bedtime.  0  . meloxicam (MOBIC) 15 MG tablet Take 15 mg by mouth daily with breakfast.  1  . pramipexole (MIRAPEX) 0.25 MG tablet Take 0.25 mg by mouth at bedtime.     . Probiotic Product (ALIGN) 4 MG CAPS Take 4 mg by mouth daily.    . ranitidine (ZANTAC) 150 MG tablet Take 150 mg by mouth 2 (two) times daily.     . sertraline (ZOLOFT) 25 MG tablet Take 25 mg by mouth at bedtime.     . topiramate (TOPAMAX) 50 MG tablet Take 50 mg by mouth daily.       Results for orders placed or performed during the hospital encounter of 02/23/17 (from the past 48 hour(s))  CBC     Status: Abnormal   Collection  Time: 02/23/17  1:17 PM  Result Value Ref Range   WBC 6.6 4.0 - 10.5 K/uL   RBC 3.96 3.87 - 5.11 MIL/uL   Hemoglobin 11.9 (L) 12.0 - 15.0 g/dL   HCT 37.7 36.0 - 46.0 %   MCV 95.2 78.0 - 100.0 fL   MCH 30.1 26.0 - 34.0 pg   MCHC 31.6 30.0 - 36.0 g/dL   RDW 14.3 11.5 - 15.5 %   Platelets 211 150 - 400 K/uL  Basic metabolic panel     Status: Abnormal   Collection Time: 02/23/17  1:17 PM  Result Value Ref Range   Sodium 140 135 - 145 mmol/L   Potassium 3.8 3.5 - 5.1 mmol/L   Chloride 108 101 - 111 mmol/L   CO2 23 22 - 32 mmol/L   Glucose, Bld 92 65 - 99 mg/dL   BUN 19 6 - 20 mg/dL   Creatinine, Ser 1.07 (H) 0.44 - 1.00 mg/dL   Calcium 8.9 8.9 - 10.3 mg/dL   GFR calc non Af Amer 48 (L) >60 mL/min   GFR calc Af Amer 56 (L) >60 mL/min    Comment: (NOTE) The eGFR has been calculated using the CKD EPI equation. This calculation has not been validated in all clinical situations. eGFR's persistently <60 mL/min signify possible Chronic Kidney Disease.    Anion gap 9 5 - 15   No results found.  Review of Systems  Musculoskeletal: Positive for back pain, joint pain and myalgias.  Neurological: Positive for tingling and sensory change.    Blood pressure (!) 165/48, pulse (!) 49, temperature 97.8 F (36.6 C), temperature source Oral, resp. rate 20, height '5\' 6"'  (1.676 m), weight 110.2 kg (243 lb), SpO2 97 %. Physical Exam  Constitutional: She is oriented to person, place, and time. She appears well-developed.  HENT:  Head: Normocephalic.  Eyes: Pupils are equal, round, and reactive to light.  Neck: Normal range of motion.  Respiratory: Effort normal and breath sounds normal.  GI: Soft. Bowel sounds are normal.  Neurological: She is alert and oriented to person, place, and time. She has normal strength. GCS eye subscore is 4. GCS verbal subscore is 5. GCS motor subscore is 6.  Strength is 5 out of 5 in her iliopsoas, quads, hip she's, gastric, and tibialis, and EHL on the left, on  the right she has some trace weakness in her iliopsoas and quads 4+ out of 5.  Skin: Skin is warm and dry.     Assessment/Plan 81 year old female presents for a right-sided L2-3 lumbar microdiscectomy.  Kamden Stanislaw P, MD 02/23/2017, 3:34 PM

## 2017-02-23 NOTE — Transfer of Care (Signed)
Immediate Anesthesia Transfer of Care Note  Patient: Renee Dudley  Procedure(s) Performed: Procedure(s): Right Lumbar Two-Three Microdiscectomy (Right)  Patient Location: PACU  Anesthesia Type:General  Level of Consciousness: awake, oriented and patient cooperative  Airway & Oxygen Therapy: Patient Spontanous Breathing and Patient connected to nasal cannula oxygen  Post-op Assessment: Report given to RN, Post -op Vital signs reviewed and stable and Patient moving all extremities  Post vital signs: Reviewed and stable  Last Vitals:  Vitals:   02/23/17 1316 02/23/17 1751  BP: (!) 165/48   Pulse: (!) 49 76  Resp: 20 12  Temp: 36.6 C 36.5 C    Last Pain:  Vitals:   02/23/17 1319  TempSrc:   PainSc: 5       Patients Stated Pain Goal: 3 (81/01/75 1025)  Complications: No apparent anesthesia complications

## 2017-02-23 NOTE — Progress Notes (Signed)
Pharmacy Antibiotic Note  Renee Dudley is a 81 y.o. female admitted on 02/23/2017 for lumbar microdiscetomy at L2-L3.  Pharmacy has been consulted for vancomycin dosing for post-op surgical prophylaxis. Patient does not have a drain in place therefore will receive one time dose 12 hours after procedure.  Plan: - Vancomycin 1000 mg IV x1  - Monitor renal function, CBC and clinical progression  Height: 5\' 6"  (167.6 cm) Weight: 243 lb (110.2 kg) IBW/kg (Calculated) : 59.3  Temp (24hrs), Avg:97.7 F (36.5 C), Min:97.5 F (36.4 C), Max:97.8 F (36.6 C)   Recent Labs Lab 02/23/17 1317  WBC 6.6  CREATININE 1.07*    Estimated Creatinine Clearance: 54.5 mL/min (A) (by C-G formula based on SCr of 1.07 mg/dL (H)).    Allergies  Allergen Reactions  . Penicillins Other (See Comments)    Pt can remember, happened 50 years ago Has patient had a PCN reaction causing immediate rash, facial/tongue/throat swelling, SOB or lightheadedness with hypotension: Unknown Has patient had a PCN reaction causing severe rash involving mucus membranes or skin necrosis: Unknown Has patient had a PCN reaction that required hospitalization:was inpatient at time of reaction Has patient had a PCN reaction occurring within the last 10 years: No If all of the above answers are "NO", then may proceed with C  . Levofloxacin     Weakness   . Zocor [Simvastatin]     UNSPECIFIED REACTION   . Sulfa Antibiotics Nausea And Vomiting    Antimicrobials this admission: Vancomycin x1 pre-op  Thank you for allowing pharmacy to be a part of this patient's care.  Dimitri Ped, PharmD, BCPS PGY-2 Infectious Diseases Pharmacy Resident Pager: [phone] 02/23/2017 7:40 PM

## 2017-02-23 NOTE — Anesthesia Procedure Notes (Signed)
Procedure Name: Intubation Date/Time: 02/23/2017 3:47 PM Performed by: Melina Copa, Rose Hippler R Pre-anesthesia Checklist: Patient identified, Emergency Drugs available, Suction available and Patient being monitored Patient Re-evaluated:Patient Re-evaluated prior to inductionOxygen Delivery Method: Circle System Utilized Preoxygenation: Pre-oxygenation with 100% oxygen Intubation Type: IV induction Ventilation: Mask ventilation without difficulty Laryngoscope Size: Mac and 3 Grade View: Grade I Tube type: Oral Tube size: 7.5 mm Number of attempts: 1 Airway Equipment and Method: Stylet and Oral airway Placement Confirmation: ETT inserted through vocal cords under direct vision,  positive ETCO2 and breath sounds checked- equal and bilateral Secured at: 20 cm Tube secured with: Tape Dental Injury: Teeth and Oropharynx as per pre-operative assessment

## 2017-02-23 NOTE — Anesthesia Postprocedure Evaluation (Signed)
Anesthesia Post Note  Patient: Renee Dudley  Procedure(s) Performed: Procedure(s) (LRB): Right Lumbar Two-Three Microdiscectomy (Right)     Patient location during evaluation: PACU Anesthesia Type: General Level of consciousness: sedated, oriented and patient cooperative Pain control: pain improving. Vital Signs Assessment: post-procedure vital signs reviewed and stable Respiratory status: spontaneous breathing, nonlabored ventilation, respiratory function stable and patient connected to nasal cannula oxygen Cardiovascular status: blood pressure returned to baseline and stable Postop Assessment: no signs of nausea or vomiting Anesthetic complications: no    Last Vitals:  Vitals:   02/23/17 1830 02/23/17 1835  BP: (!) 152/59   Pulse: 71 64  Resp: 14 12  Temp:      Last Pain:  Vitals:   02/23/17 1830  TempSrc:   PainSc: 7                  Salena Ortlieb,E. Jinan Biggins

## 2017-02-23 NOTE — Op Note (Signed)
Preoperative diagnosis: Right L3 radiculopathy from herniated disc L2-3 right  Postoperative diagnosis: Same  Procedure: Right-sided lumbar microdiscectomy L2-3 with microdissection of the right L3 nerve root microscopic discectomy  Surgeon: Dominica Severin Tawanda Schall  Asst.: Glenford Peers  Anesthesia: Gen.  EBL: Minimal  History of present illness: Patient is a 81 year old female with progressive right hip and leg pain rating down the lateral and anterior aspect of her right quad. Workup revealed a very large disc herniation at L2-3 on the right and due the patient's failure conservative treatment imaging findings and progressive clinical syndrome or recommended lumbar microdiscectomy at L2-3 on the right I extensively went over the risks and benefits of the operation with her as well as perioperative course expectations of outcome and alternatives of surgery and she understands and agrees to proceed forward.  Operative procedure: Patient brought in the or was induced on general anesthesia positioned prone the Wilson frame her back was prepped and draped in routine sterile fashion preoperative localizing appropriate level so after infiltration of 10 mL lidocaine with epi a midline incision was made and Bovie cautery was used to gas in tissues and subperiosteal dissection was carried out at L2-3 on the right. Interoperative x-ray confirmed the location of L1 to just above this so below this I drilled off the inferior aspect limit L2 medial facet complex super aspect of lamina L3 begun the laminotomy with a 2 and 3 minute Kerrison punch then under Mike's cup illumination removed a significant hypertrophied ligament flavum exposing the thecal sac under bit the medial gutter identify the L3 pedicle and the L3 nerve root. It was densely adherent to a large multiple large free fragments of disc which were teased off of the nerve root with a nerve hook and coronary dilator. Exam of the disc space was spondylitic removed  compressive fragments just inferior the disc space and after the disc discectomy and removal of free fragment thecal sac was widely patent 3 foramen was widely patent I think maintain meticulous in a stasis COBAS irrigated wound closed in layers with after Vicryl and a running 4 subcuticular Dermabond benzo and Steri-Strips and sterile dressing was applied patient recovered in stable condition. At the end of case all needle counts and sponge counts were correct.

## 2017-02-23 NOTE — Anesthesia Preprocedure Evaluation (Signed)
Anesthesia Evaluation  Patient identified by MRN, date of birth, ID band Patient awake    Reviewed: Allergy & Precautions, NPO status , Patient's Chart, lab work & pertinent test results  Airway Mallampati: II  TM Distance: >3 FB Neck ROM: Full    Dental  (+) Teeth Intact, Dental Advisory Given   Pulmonary    breath sounds clear to auscultation       Cardiovascular hypertension,  Rhythm:Regular Rate:Normal     Neuro/Psych    GI/Hepatic   Endo/Other    Renal/GU      Musculoskeletal   Abdominal   Peds  Hematology   Anesthesia Other Findings   Reproductive/Obstetrics                             Anesthesia Physical Anesthesia Plan  ASA: III  Anesthesia Plan: General   Post-op Pain Management:    Induction: Intravenous  PONV Risk Score and Plan: 2 and Ondansetron and Dexamethasone  Airway Management Planned: Oral ETT  Additional Equipment:   Intra-op Plan:   Post-operative Plan: Extubation in OR  Informed Consent: I have reviewed the patients History and Physical, chart, labs and discussed the procedure including the risks, benefits and alternatives for the proposed anesthesia with the patient or authorized representative who has indicated his/her understanding and acceptance.   Dental advisory given  Plan Discussed with: CRNA and Anesthesiologist  Anesthesia Plan Comments:         Anesthesia Quick Evaluation

## 2017-02-24 ENCOUNTER — Encounter (HOSPITAL_COMMUNITY): Payer: Self-pay | Admitting: Neurosurgery

## 2017-02-24 DIAGNOSIS — R51 Headache: Secondary | ICD-10-CM | POA: Diagnosis not present

## 2017-02-24 DIAGNOSIS — M5116 Intervertebral disc disorders with radiculopathy, lumbar region: Secondary | ICD-10-CM | POA: Diagnosis not present

## 2017-02-24 DIAGNOSIS — Z79891 Long term (current) use of opiate analgesic: Secondary | ICD-10-CM | POA: Diagnosis not present

## 2017-02-24 DIAGNOSIS — R69 Illness, unspecified: Secondary | ICD-10-CM | POA: Diagnosis not present

## 2017-02-24 DIAGNOSIS — I2510 Atherosclerotic heart disease of native coronary artery without angina pectoris: Secondary | ICD-10-CM | POA: Diagnosis not present

## 2017-02-24 DIAGNOSIS — K219 Gastro-esophageal reflux disease without esophagitis: Secondary | ICD-10-CM | POA: Diagnosis not present

## 2017-02-24 DIAGNOSIS — Z79899 Other long term (current) drug therapy: Secondary | ICD-10-CM | POA: Diagnosis not present

## 2017-02-24 DIAGNOSIS — Z791 Long term (current) use of non-steroidal anti-inflammatories (NSAID): Secondary | ICD-10-CM | POA: Diagnosis not present

## 2017-02-24 DIAGNOSIS — I10 Essential (primary) hypertension: Secondary | ICD-10-CM | POA: Diagnosis not present

## 2017-02-24 MED ORDER — HYDROCODONE-ACETAMINOPHEN 5-325 MG PO TABS: 0.5000 | ORAL_TABLET | Freq: Every day | ORAL | 0 refills | Status: DC

## 2017-02-24 NOTE — Progress Notes (Signed)
Pt doing well. Pt and son given D/C instructions with Rx, verbal understanding was provided. Pt's incision is clean and dry with no sign of infection. Pt's IV was removed prior to D/C. Pt D/C'd home via wheelchair @ 1115 per MD order. Pt is stable @ D/C and has no other needs at this time. Holli Humbles, RN

## 2017-02-24 NOTE — Progress Notes (Signed)
Patient ID: Renee Dudley, female   DOB: 1936/01/23, 81 y.o.   MRN: [id number] Doing great no leg pain  Strength out of 5 wound clean dry and intact  Mobilized today discharge follow-up in 2 weeks

## 2017-02-24 NOTE — Discharge Summary (Signed)
  Physician Discharge Summary  Patient ID: Renee Dudley MRN: [id number] DOB/AGE: March 24, 1936 81 y.o.  Admit date: 02/23/2017 Discharge date: 02/24/2017  Admission Diagnoses:HNP L2-3 right  Discharge Diagnoses: Same Active Problems:   HNP (herniated nucleus pulposus), lumbar   Discharged Condition: good  Hospital Course: Patient admitted underwent lumbar micro-discectomy L2-3 on the right postop patient did very well recovered in the floor on the floor was angling and voiding spontaneously tolerating regular Stable for discharge home.  Consults: Significant Diagnostic Studies: Treatments: L2-3 microdiscectomy. Discharge Exam: Blood pressure (!) 122/42, pulse (!) 54, temperature 97.7 F (36.5 C), temperature source Oral, resp. rate 18, height 5\' 6"  (1.676 m), weight 110.2 kg (243 lb), SpO2 95 %. Strength out of 5 wound clean dry and intact.  Disposition: Home   Allergies as of 02/24/2017      Reactions   Penicillins Other (See Comments)   Pt can remember, happened 50 years ago Has patient had a PCN reaction causing immediate rash, facial/tongue/throat swelling, SOB or lightheadedness with hypotension: Unknown Has patient had a PCN reaction causing severe rash involving mucus membranes or skin necrosis: Unknown Has patient had a PCN reaction that required hospitalization:was inpatient at time of reaction Has patient had a PCN reaction occurring within the last 10 years: No If all of the above answers are "NO", then may proceed with C   Levofloxacin    Weakness    Zocor [simvastatin]    UNSPECIFIED REACTION    Sulfa Antibiotics Nausea And Vomiting      Medication List    TAKE these medications   ALIGN 4 MG Caps Take 4 mg by mouth daily.   ALPRAZolam 0.25 MG tablet Commonly known as:  XANAX Take 0.5 mg by mouth at bedtime as needed for sleep.   atenolol 50 MG tablet Commonly known as:  TENORMIN Take 50 mg by mouth daily.   cholecalciferol 1000 units  tablet Commonly known as:  VITAMIN D Take 2,000 Units by mouth daily.   diclofenac sodium 1 % Gel Commonly known as:  VOLTAREN Apply 1 g topically 2 (two) times daily as needed. For pain.   docusate sodium 100 MG capsule Commonly known as:  COLACE Take 400 mg by mouth at bedtime.   furosemide 40 MG tablet Commonly known as:  LASIX Take 40 mg by mouth daily.   HYDROcodone-acetaminophen 5-325 MG tablet Commonly known as:  NORCO/VICODIN Take 0.5 tablets by mouth at bedtime. What changed:  Another medication with the same name was added. Make sure you understand how and when to take each.   HYDROcodone-acetaminophen 5-325 MG tablet Commonly known as:  NORCO/VICODIN Take 0.5 tablets by mouth at bedtime. What changed:  You were already taking a medication with the same name, and this prescription was added. Make sure you understand how and when to take each.   meloxicam 15 MG tablet Commonly known as:  MOBIC Take 15 mg by mouth daily with breakfast.   pramipexole 0.25 MG tablet Commonly known as:  MIRAPEX Take 0.25 mg by mouth at bedtime.   ranitidine 150 MG tablet Commonly known as:  ZANTAC Take 150 mg by mouth 2 (two) times daily.   sertraline 25 MG tablet Commonly known as:  ZOLOFT Take 25 mg by mouth at bedtime.   topiramate 50 MG tablet Commonly known as:  TOPAMAX Take 50 mg by mouth daily.        Signed: Jeryl Wilbourn P 02/24/2017, 10:00 AM

## 2017-02-24 NOTE — Discharge Instructions (Signed)
No lifting no bending no twisting no driving a riding currently she is coming back and forth to see me. Keep the incision clean dry and intact. May remove the outer dressing 2-3 days of the sensations on and intact. When the Steri-Strips expose these cover with a watertight dressing for showers only.  Wound Care Keep incision covered and dry for one week.  If you shower prior to then, cover incision with plastic wrap.  You may remove outer bandage after one week and shower.  Do not put any creams, lotions, or ointments on incision. Leave steri-strips on neck.  They will fall off by themselves. Activity Walk each and every day, increasing distance each day. No lifting greater than 5 lbs.  Avoid bending, arching, or twisting. No driving for 2 weeks; may ride as a passenger locally. If provided with back brace, wear when out of bed.  It is not necessary to wear in bed. Diet Resume your normal diet.  Return to Work Will be discussed at you follow up appointment. Call Your Doctor If Any of These Occur Redness, drainage, or swelling at the wound.  Temperature greater than 101 degrees. Severe pain not relieved by pain medication. Incision starts to come apart. Follow Up Appt Call today for appointment in 1-2 weeks ([phone]) or for problems.  If you have any hardware placed in your spine, you will need an x-ray before your appointment.

## 2017-03-18 DIAGNOSIS — Z0001 Encounter for general adult medical examination with abnormal findings: Secondary | ICD-10-CM | POA: Diagnosis not present

## 2017-03-18 DIAGNOSIS — Z6839 Body mass index (BMI) 39.0-39.9, adult: Secondary | ICD-10-CM | POA: Diagnosis not present

## 2017-03-18 DIAGNOSIS — K219 Gastro-esophageal reflux disease without esophagitis: Secondary | ICD-10-CM | POA: Diagnosis not present

## 2017-03-18 DIAGNOSIS — E7800 Pure hypercholesterolemia, unspecified: Secondary | ICD-10-CM | POA: Diagnosis not present

## 2017-03-18 DIAGNOSIS — I10 Essential (primary) hypertension: Secondary | ICD-10-CM | POA: Diagnosis not present

## 2017-03-18 DIAGNOSIS — E559 Vitamin D deficiency, unspecified: Secondary | ICD-10-CM | POA: Diagnosis not present

## 2017-03-22 DIAGNOSIS — G2581 Restless legs syndrome: Secondary | ICD-10-CM | POA: Diagnosis not present

## 2017-03-22 DIAGNOSIS — E7800 Pure hypercholesterolemia, unspecified: Secondary | ICD-10-CM | POA: Diagnosis not present

## 2017-03-22 DIAGNOSIS — Z6841 Body Mass Index (BMI) 40.0 and over, adult: Secondary | ICD-10-CM | POA: Diagnosis not present

## 2017-03-22 DIAGNOSIS — M545 Low back pain: Secondary | ICD-10-CM | POA: Diagnosis not present

## 2017-03-22 DIAGNOSIS — I10 Essential (primary) hypertension: Secondary | ICD-10-CM | POA: Diagnosis not present

## 2017-03-22 DIAGNOSIS — E559 Vitamin D deficiency, unspecified: Secondary | ICD-10-CM | POA: Diagnosis not present

## 2017-03-22 DIAGNOSIS — N393 Stress incontinence (female) (male): Secondary | ICD-10-CM | POA: Diagnosis not present

## 2017-05-12 ENCOUNTER — Other Ambulatory Visit (HOSPITAL_COMMUNITY): Payer: Self-pay | Admitting: Neurosurgery

## 2017-05-12 DIAGNOSIS — M5126 Other intervertebral disc displacement, lumbar region: Secondary | ICD-10-CM

## 2017-05-19 ENCOUNTER — Ambulatory Visit (HOSPITAL_COMMUNITY)
Admission: RE | Admit: 2017-05-19 | Discharge: 2017-05-19 | Disposition: A | Discharge status: Home / Self Care | Payer: Medicare HMO | Source: Ambulatory Visit | Attending: Neurosurgery | Admitting: Neurosurgery

## 2017-05-19 DIAGNOSIS — M5136 Other intervertebral disc degeneration, lumbar region: Secondary | ICD-10-CM | POA: Insufficient documentation

## 2017-05-19 DIAGNOSIS — M8938 Hypertrophy of bone, other site: Secondary | ICD-10-CM | POA: Insufficient documentation

## 2017-05-19 DIAGNOSIS — M47897 Other spondylosis, lumbosacral region: Secondary | ICD-10-CM | POA: Diagnosis not present

## 2017-05-19 DIAGNOSIS — M48061 Spinal stenosis, lumbar region without neurogenic claudication: Secondary | ICD-10-CM | POA: Insufficient documentation

## 2017-05-19 DIAGNOSIS — M4306 Spondylolysis, lumbar region: Secondary | ICD-10-CM | POA: Insufficient documentation

## 2017-05-19 DIAGNOSIS — M4307 Spondylolysis, lumbosacral region: Secondary | ICD-10-CM | POA: Diagnosis not present

## 2017-05-19 DIAGNOSIS — M5126 Other intervertebral disc displacement, lumbar region: Secondary | ICD-10-CM

## 2017-05-19 LAB — POCT I-STAT CREATININE: Creatinine, Ser: 1.4 mg/dL — ABNORMAL HIGH (ref 0.44–1.00)

## 2017-05-19 MED ORDER — GADOBENATE DIMEGLUMINE 529 MG/ML IV SOLN
10.0000 mL | Freq: Once | INTRAVENOUS | Status: AC | PRN
  Administered 2017-05-19: 10 mL via INTRAVENOUS

## 2017-05-27 ENCOUNTER — Other Ambulatory Visit (HOSPITAL_COMMUNITY): Payer: Self-pay | Admitting: Neurosurgery

## 2017-05-27 DIAGNOSIS — M5126 Other intervertebral disc displacement, lumbar region: Secondary | ICD-10-CM

## 2017-06-16 DIAGNOSIS — M47816 Spondylosis without myelopathy or radiculopathy, lumbar region: Secondary | ICD-10-CM | POA: Diagnosis not present

## 2017-06-20 DIAGNOSIS — H26491 Other secondary cataract, right eye: Secondary | ICD-10-CM | POA: Diagnosis not present

## 2017-07-20 DIAGNOSIS — R69 Illness, unspecified: Secondary | ICD-10-CM | POA: Diagnosis not present

## 2017-08-16 DIAGNOSIS — M7041 Prepatellar bursitis, right knee: Secondary | ICD-10-CM | POA: Diagnosis not present

## 2017-08-16 DIAGNOSIS — Z96653 Presence of artificial knee joint, bilateral: Secondary | ICD-10-CM | POA: Diagnosis not present

## 2017-08-30 DIAGNOSIS — K219 Gastro-esophageal reflux disease without esophagitis: Secondary | ICD-10-CM | POA: Diagnosis not present

## 2017-08-30 DIAGNOSIS — M545 Low back pain: Secondary | ICD-10-CM | POA: Diagnosis not present

## 2017-08-30 DIAGNOSIS — O26892 Other specified pregnancy related conditions, second trimester: Secondary | ICD-10-CM | POA: Diagnosis not present

## 2017-08-30 DIAGNOSIS — G2581 Restless legs syndrome: Secondary | ICD-10-CM | POA: Diagnosis not present

## 2017-08-30 DIAGNOSIS — Z6839 Body mass index (BMI) 39.0-39.9, adult: Secondary | ICD-10-CM | POA: Diagnosis not present

## 2017-09-16 ENCOUNTER — Ambulatory Visit: Payer: Medicare HMO | Admitting: Cardiology

## 2017-09-16 ENCOUNTER — Other Ambulatory Visit: Payer: Self-pay

## 2017-09-16 ENCOUNTER — Encounter: Payer: Self-pay | Admitting: Cardiology

## 2017-09-16 VITALS — BP 140/78 | HR 57 | Ht 66.0 in | Wt 236.4 lb

## 2017-09-16 DIAGNOSIS — I2510 Atherosclerotic heart disease of native coronary artery without angina pectoris: Secondary | ICD-10-CM

## 2017-09-16 DIAGNOSIS — Z789 Other specified health status: Secondary | ICD-10-CM

## 2017-09-16 DIAGNOSIS — I10 Essential (primary) hypertension: Secondary | ICD-10-CM | POA: Diagnosis not present

## 2017-09-16 NOTE — Progress Notes (Signed)
Cardiology Office Note  Date: 09/16/2017   ID: Renee Dudley, DOB 12/20/1935, MRN [id number]  PCP: [name], MD  Primary Cardiologist: [name], MD   Chief Complaint  Patient presents with  . Coronary Artery Disease    History of Present Illness: Renee Dudley is a 82 y.o. female last seen in December 2017. She presents for a routine follow-up visit. Over the last year she does not report any exertional chest pain or worsening shortness of breath. She is limited by back pain and arthritic pain in her legs. Still remains active around her house, cooked for 35 family members on Christmas day.  I personally reviewed her ECG today which shows sinus bradycardia.  We went over her medications. She continues on atenolol, ASA and Lasix. She is intolerant to statins.  Past Medical History:  Diagnosis Date  . Anxiety   . Chronic headaches   . Coronary atherosclerosis of native coronary artery    Nonobstructive at cardiac catheterization 2007   . Depression   . DJD (degenerative joint disease)   . Dyspnea   . Essential hypertension, benign   . GERD (gastroesophageal reflux disease)   . History of gout   . Mixed hyperlipidemia   . Peripheral edema   . Primary localized osteoarthritis of left knee   . Varicose veins     Past Surgical History:  Procedure Laterality Date  . APPENDECTOMY    . Cataract surgery Bilateral   . CHOLECYSTECTOMY  1986  . COLONOSCOPY    . LUMBAR LAMINECTOMY/DECOMPRESSION MICRODISCECTOMY Right 02/23/2017   Procedure: Right Lumbar Two-Three Microdiscectomy;  Surgeon: Kary Kos, MD;  Location: [facility];  Service: Neurosurgery;  Laterality: Right;  . Right total knee arthroplasty  2011  . TOTAL ABDOMINAL HYSTERECTOMY W/ BILATERAL SALPINGOOPHORECTOMY  1988  . TOTAL KNEE ARTHROPLASTY Left 12/23/2014   Procedure: LEFT TOTAL KNEE ARTHROPLASTY;  Surgeon: Elsie Saas, MD;  Location: [facility];  Service: Orthopedics;  Laterality: Left;    Current  Outpatient Medications  Medication Sig Dispense Refill  . ALPRAZolam (XANAX) 0.25 MG tablet Take 0.5 mg by mouth 2 (two) times daily.     [name] atenolol (TENORMIN) 50 MG tablet Take 50 mg by mouth daily.    . cholecalciferol (VITAMIN D) 1000 units tablet Take 2,000 Units by mouth daily.    . diclofenac sodium (VOLTAREN) 1 % GEL Apply 1 g topically 2 (two) times daily as needed. For pain.  12  . docusate sodium (COLACE) 100 MG capsule Take 400 mg by mouth at bedtime.    . furosemide (LASIX) 40 MG tablet Take 40 mg by mouth daily.      . pramipexole (MIRAPEX) 0.25 MG tablet Take 0.25 mg by mouth at bedtime.     . Probiotic Product (ALIGN) 4 MG CAPS Take 4 mg by mouth daily.    . ranitidine (ZANTAC) 150 MG tablet Take 150 mg by mouth 2 (two) times daily.     . sertraline (ZOLOFT) 25 MG tablet Take 25 mg by mouth at bedtime.     . topiramate (TOPAMAX) 50 MG tablet Take 50 mg by mouth 2 (two) times daily.     . traMADol (ULTRAM) 50 MG tablet Take 50 mg by mouth every 6 (six) hours as needed.     No current facility-administered medications for this visit.    Allergies:  Penicillins; Levofloxacin; Zocor [simvastatin]; and Sulfa antibiotics   Social History: The patient  reports that  has never smoked. she  has never used smokeless tobacco. She reports that she does not drink alcohol or use drugs.   ROS:  Please see the history of present illness. Otherwise, complete review of systems is positive for chronic back and leg pain.  All other systems are reviewed and negative.   Physical Exam: VS:  BP 140/78   Pulse (!) 57   Ht 5\' 6"  (1.676 m)   Wt 236 lb 6.4 oz (107.2 kg)   BMI 38.16 kg/m , BMI Body mass index is 38.16 kg/m.  Wt Readings from Last 3 Encounters:  09/16/17 236 lb 6.4 oz (107.2 kg)  02/23/17 243 lb (110.2 kg)  02/21/17 243 lb 1.6 oz (110.3 kg)    General: Patient appears comfortable at rest. HEENT: Conjunctiva and lids normal, oropharynx clear. Neck: Supple, no elevated JVP or  carotid bruits, no thyromegaly. Lungs: Clear to auscultation, nonlabored breathing at rest. Cardiac: Regular rate and rhythm, no S3 or significant systolic murmur. Abdomen: Soft, nontender, bowel sounds present. Extremities: No pitting edema, distal pulses 2+.  ECG: I personally reviewed the tracing from 08/18/2016 which showed sinus bradycardia with nonspecific T-wave changes.  Recent Labwork: 02/23/2017: BUN 19; Hemoglobin 11.9; Platelets 211; Potassium 3.8; Sodium 140 05/19/2017: Creatinine, Ser 1.40   Other Studies Reviewed Today:  Lexiscan Cardiolite February 2015: No diagnostic ST segment changes, breast attenuation without clear evidence of scar or ischemia, LVEF 77%.  Assessment and Plan:  1. History of nonobstructive CAD, remains stable without active angina. ECG reviewed as well. Continue with medical therapy and observation.  2. History of statin intolerance. She follows lipids with Dr. Nadara Mustard.  3. Essential hypertension, continues on atenolol and Lasix. Keep follow-up with Dr. Nadara Mustard.  Current medicines were reviewed with the patient today.   Orders Placed This Encounter  Procedures  . EKG 12-Lead    Disposition: Follow-up in one year, sooner if needed.   Signed, Satira Sark, MD, [facility] 09/16/2017 2:12 PM    [facility] [place], [place], [place] [address] Phone: [phone]; Fax: [phone]

## 2017-09-16 NOTE — Patient Instructions (Signed)

## 2017-09-30 DIAGNOSIS — I10 Essential (primary) hypertension: Secondary | ICD-10-CM | POA: Diagnosis not present

## 2017-09-30 DIAGNOSIS — K219 Gastro-esophageal reflux disease without esophagitis: Secondary | ICD-10-CM | POA: Diagnosis not present

## 2017-09-30 DIAGNOSIS — K580 Irritable bowel syndrome with diarrhea: Secondary | ICD-10-CM | POA: Diagnosis not present

## 2017-09-30 DIAGNOSIS — E7800 Pure hypercholesterolemia, unspecified: Secondary | ICD-10-CM | POA: Diagnosis not present

## 2017-09-30 DIAGNOSIS — E559 Vitamin D deficiency, unspecified: Secondary | ICD-10-CM | POA: Diagnosis not present

## 2017-09-30 DIAGNOSIS — M1000 Idiopathic gout, unspecified site: Secondary | ICD-10-CM | POA: Diagnosis not present

## 2017-10-05 DIAGNOSIS — I10 Essential (primary) hypertension: Secondary | ICD-10-CM | POA: Diagnosis not present

## 2017-10-05 DIAGNOSIS — K219 Gastro-esophageal reflux disease without esophagitis: Secondary | ICD-10-CM | POA: Diagnosis not present

## 2017-10-05 DIAGNOSIS — M1000 Idiopathic gout, unspecified site: Secondary | ICD-10-CM | POA: Diagnosis not present

## 2017-10-05 DIAGNOSIS — E559 Vitamin D deficiency, unspecified: Secondary | ICD-10-CM | POA: Diagnosis not present

## 2017-10-05 DIAGNOSIS — Z6841 Body Mass Index (BMI) 40.0 and over, adult: Secondary | ICD-10-CM | POA: Diagnosis not present

## 2017-10-05 DIAGNOSIS — G43909 Migraine, unspecified, not intractable, without status migrainosus: Secondary | ICD-10-CM | POA: Diagnosis not present

## 2017-10-05 DIAGNOSIS — G2581 Restless legs syndrome: Secondary | ICD-10-CM | POA: Diagnosis not present

## 2017-10-05 DIAGNOSIS — M545 Low back pain: Secondary | ICD-10-CM | POA: Diagnosis not present

## 2017-11-30 DIAGNOSIS — H26491 Other secondary cataract, right eye: Secondary | ICD-10-CM | POA: Diagnosis not present

## 2017-12-12 DIAGNOSIS — H26491 Other secondary cataract, right eye: Secondary | ICD-10-CM | POA: Diagnosis not present

## 2018-01-18 DIAGNOSIS — H1013 Acute atopic conjunctivitis, bilateral: Secondary | ICD-10-CM | POA: Diagnosis not present

## 2018-02-09 DIAGNOSIS — M109 Gout, unspecified: Secondary | ICD-10-CM | POA: Diagnosis not present

## 2018-02-09 DIAGNOSIS — Z6841 Body Mass Index (BMI) 40.0 and over, adult: Secondary | ICD-10-CM | POA: Diagnosis not present

## 2018-04-12 DIAGNOSIS — I10 Essential (primary) hypertension: Secondary | ICD-10-CM | POA: Diagnosis not present

## 2018-04-12 DIAGNOSIS — N183 Chronic kidney disease, stage 3 (moderate): Secondary | ICD-10-CM | POA: Diagnosis not present

## 2018-04-12 DIAGNOSIS — R5383 Other fatigue: Secondary | ICD-10-CM | POA: Diagnosis not present

## 2018-04-12 DIAGNOSIS — M109 Gout, unspecified: Secondary | ICD-10-CM | POA: Diagnosis not present

## 2018-04-12 DIAGNOSIS — Z1389 Encounter for screening for other disorder: Secondary | ICD-10-CM | POA: Diagnosis not present

## 2018-04-12 DIAGNOSIS — K580 Irritable bowel syndrome with diarrhea: Secondary | ICD-10-CM | POA: Diagnosis not present

## 2018-04-12 DIAGNOSIS — Z1331 Encounter for screening for depression: Secondary | ICD-10-CM | POA: Diagnosis not present

## 2018-04-12 DIAGNOSIS — Z6841 Body Mass Index (BMI) 40.0 and over, adult: Secondary | ICD-10-CM | POA: Diagnosis not present

## 2018-05-07 DIAGNOSIS — R69 Illness, unspecified: Secondary | ICD-10-CM | POA: Diagnosis not present

## 2018-05-20 IMAGING — CR DG LUMBAR SPINE 2-3V
2 series · 2 of 2 positions shown · non-contrast
Comparison: 01/12/2017 MR

CLINICAL DATA: Localizer for lumbar surgery.

EXAM:
LUMBAR SPINE - 2-3 VIEW

[xtable lateral (1 of 2)]
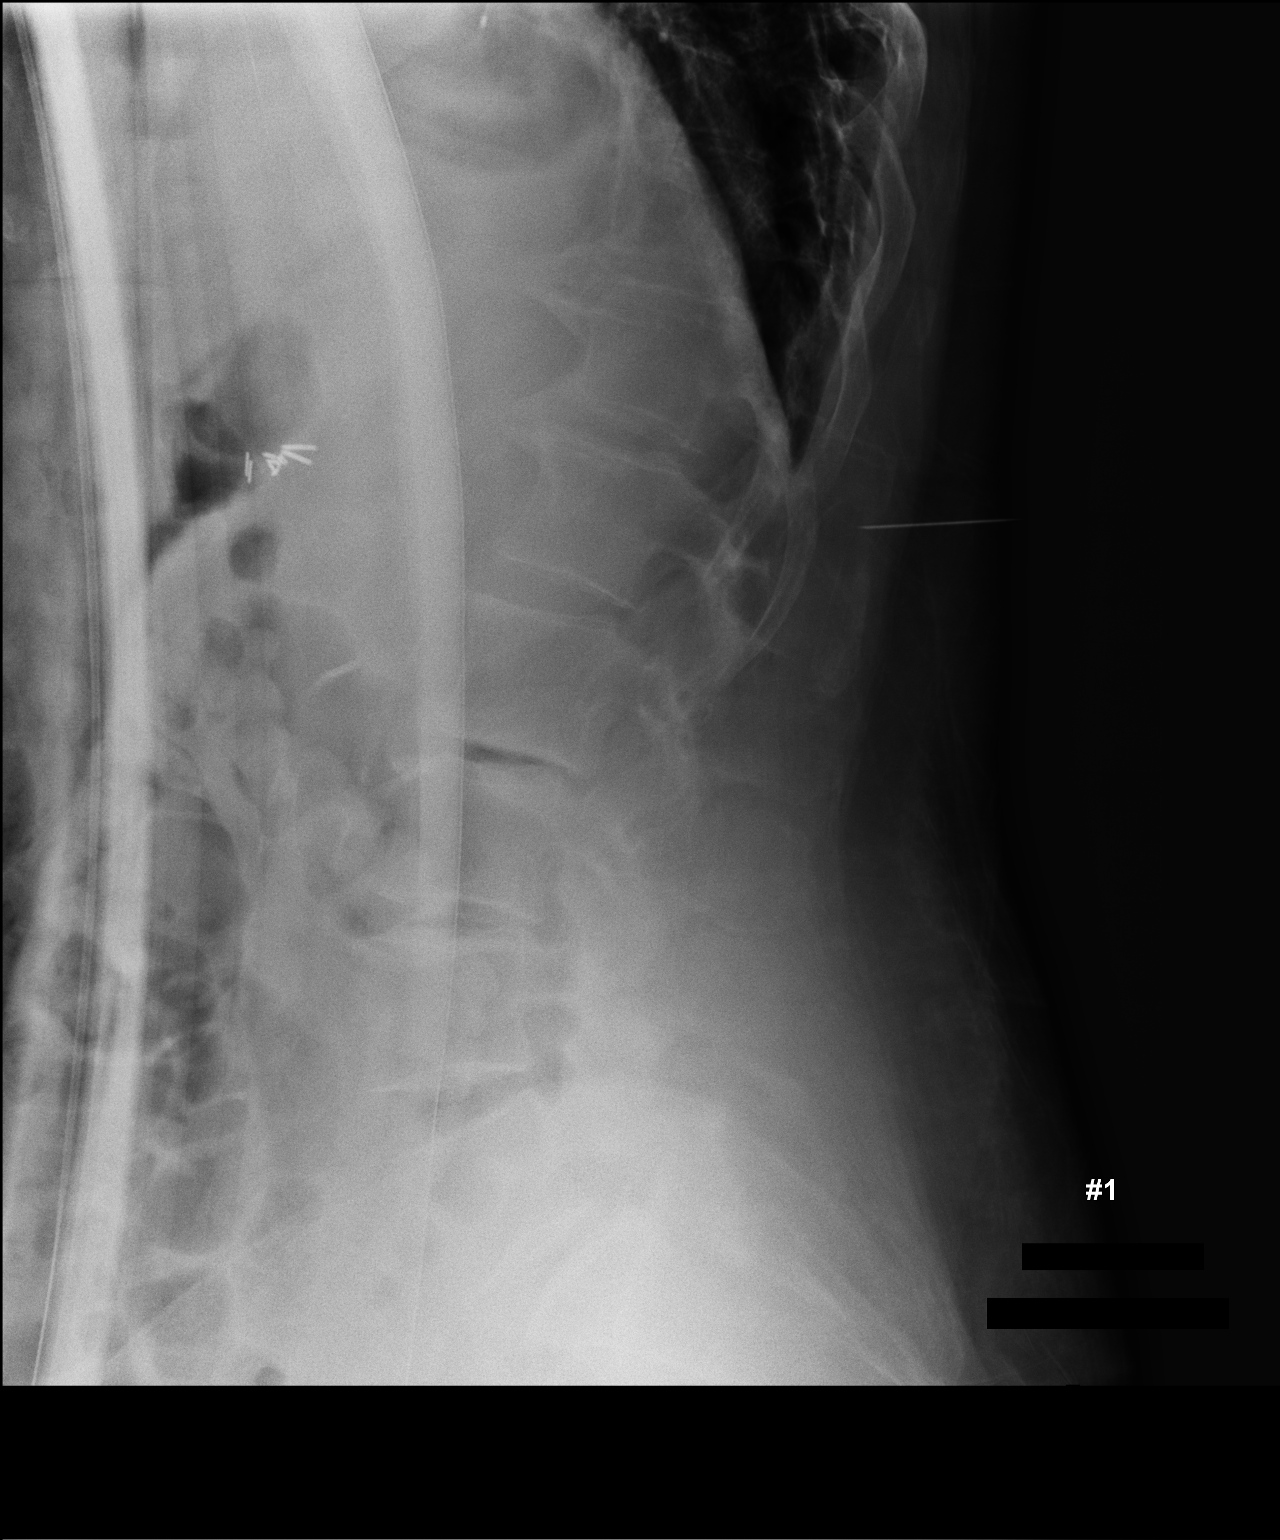

[xtable lateral (2 of 2)]
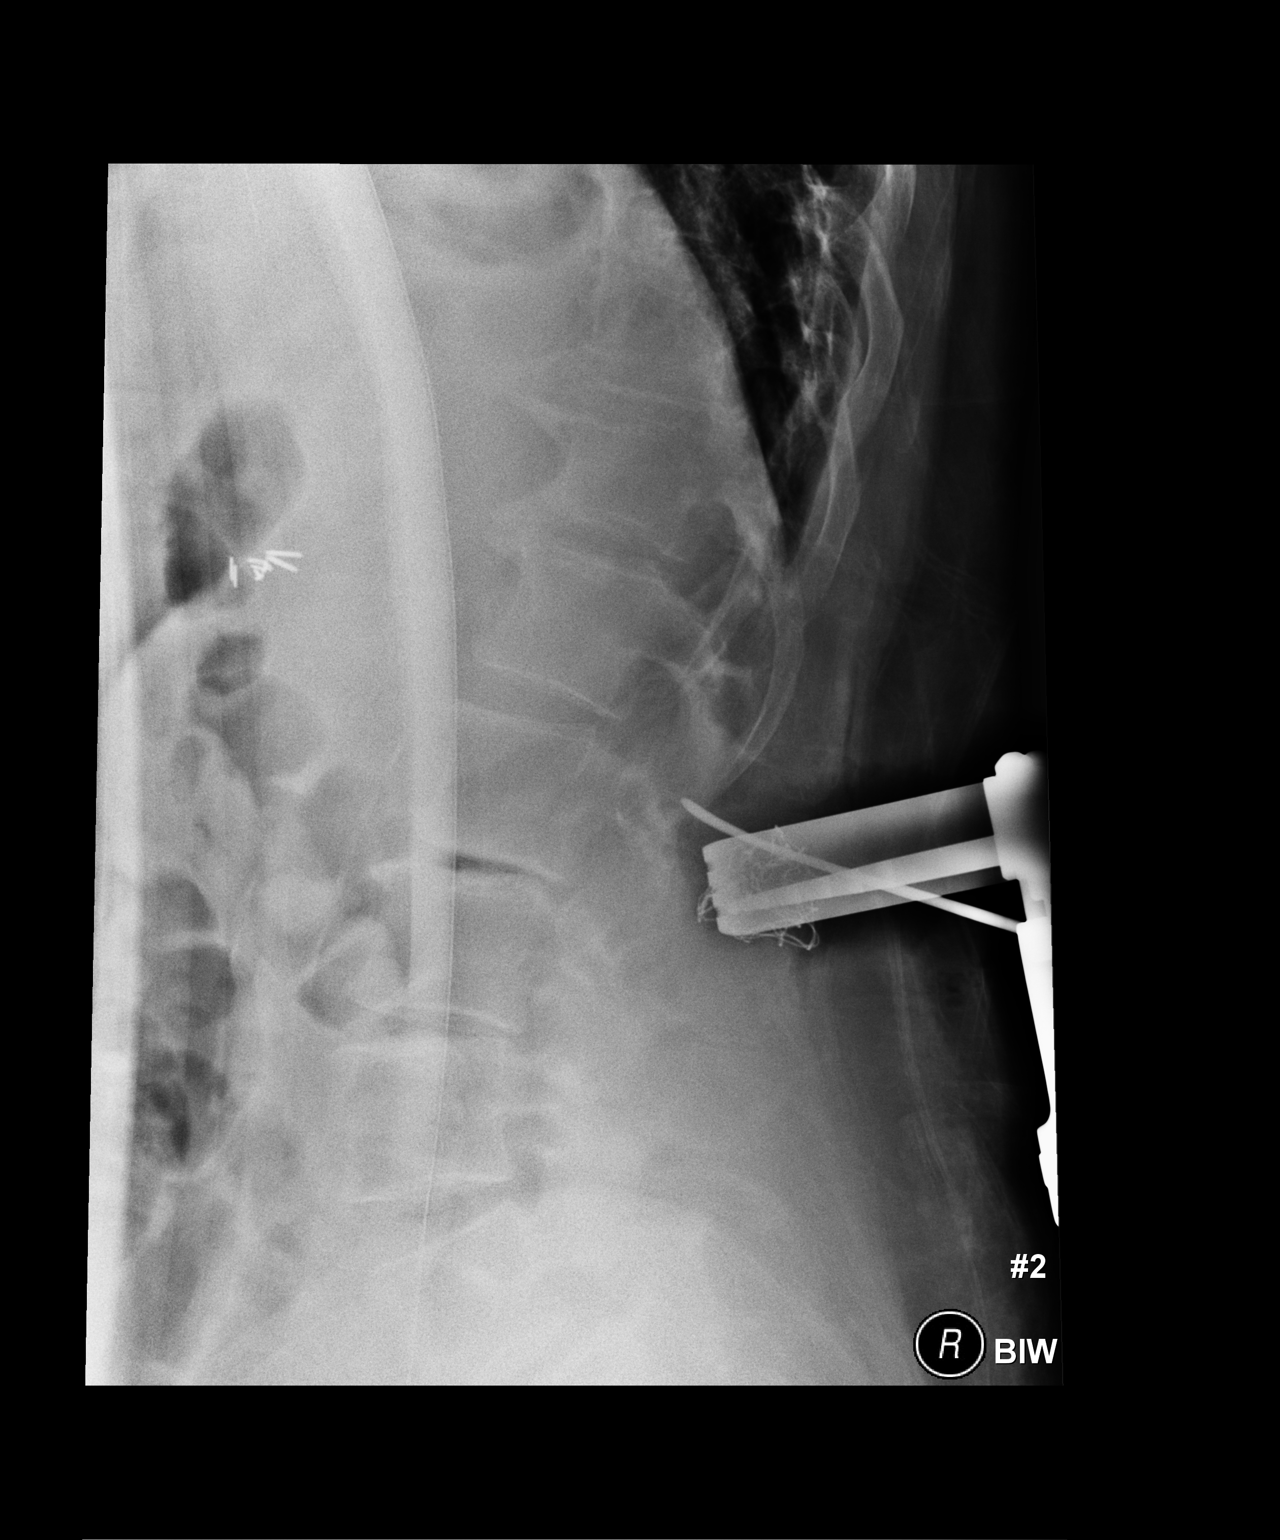

[2 of 2 positions shown; findings below may reference images not displayed]

FINDINGS: Two lateral intraoperative views of the lumbar spine are submitted
postoperatively for interpretation.

Film 1 demonstrates a posterior metallic probe with tip between the
T12 and L1 spinous processes.

Film 2 demonstrates a posterior metallic probe directed at the
superior aspect of L2.
IMPRESSION: As above

## 2018-06-05 DIAGNOSIS — R69 Illness, unspecified: Secondary | ICD-10-CM | POA: Diagnosis not present

## 2018-06-27 DIAGNOSIS — H26492 Other secondary cataract, left eye: Secondary | ICD-10-CM | POA: Diagnosis not present

## 2018-07-04 DIAGNOSIS — T560X1A Toxic effect of lead and its compounds, accidental (unintentional), initial encounter: Secondary | ICD-10-CM | POA: Diagnosis not present

## 2018-07-04 DIAGNOSIS — M47816 Spondylosis without myelopathy or radiculopathy, lumbar region: Secondary | ICD-10-CM | POA: Diagnosis not present

## 2018-07-10 DIAGNOSIS — Z6841 Body Mass Index (BMI) 40.0 and over, adult: Secondary | ICD-10-CM | POA: Diagnosis not present

## 2018-07-10 DIAGNOSIS — H6242 Otitis externa in other diseases classified elsewhere, left ear: Secondary | ICD-10-CM | POA: Diagnosis not present

## 2018-08-01 DIAGNOSIS — M545 Low back pain: Secondary | ICD-10-CM | POA: Diagnosis not present

## 2018-08-01 DIAGNOSIS — M47816 Spondylosis without myelopathy or radiculopathy, lumbar region: Secondary | ICD-10-CM | POA: Diagnosis not present

## 2018-08-13 IMAGING — MR MR LUMBAR SPINE WO/W CM
4 of 7 series · 11 of 48 positions shown · IV contrast (10ml Multihance)
Comparison: Prior preoperative MRI from 01/12/2017

CLINICAL DATA: Initial evaluation for lower back pain extending
into the lower extremities for 3-4 weeks. Recent surgery in Saturday February, 2017.

EXAM:
MRI LUMBAR SPINE WITHOUT AND WITH CONTRAST
TECHNIQUE: Multiplanar and multiecho pulse sequences of the lumbar spine were
obtained without and with intravenous contrast.
CONTRAST:  10mL MULTIHANCE GADOBENATE DIMEGLUMINE 529 MG/ML IV SOLN

[Series 3: T1 · sagittal · 4.0mm · 0.35mm/px · 3 of 15 slices shown (1 of 2)]
[im 1/15]
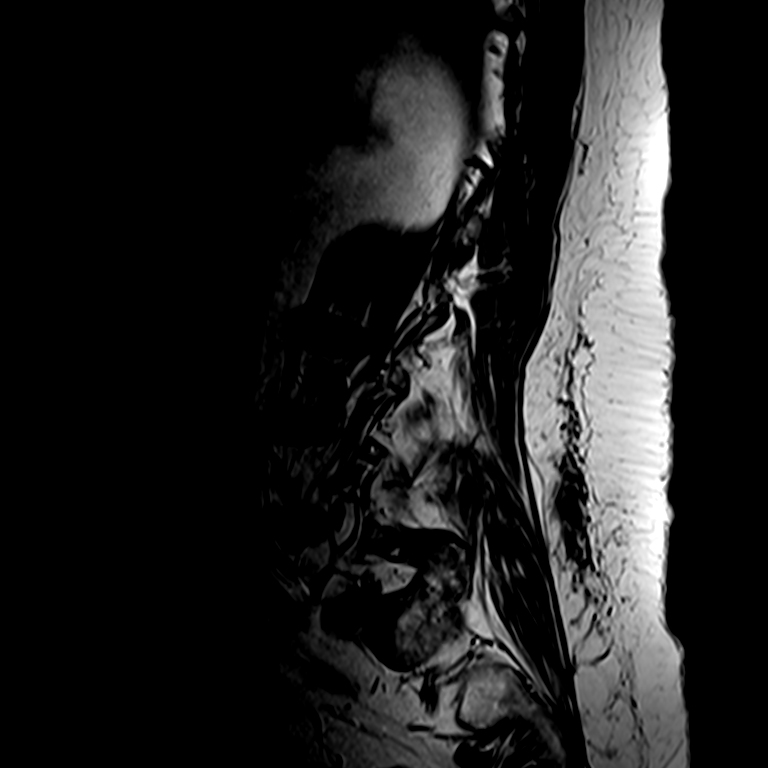
[im 8/15]
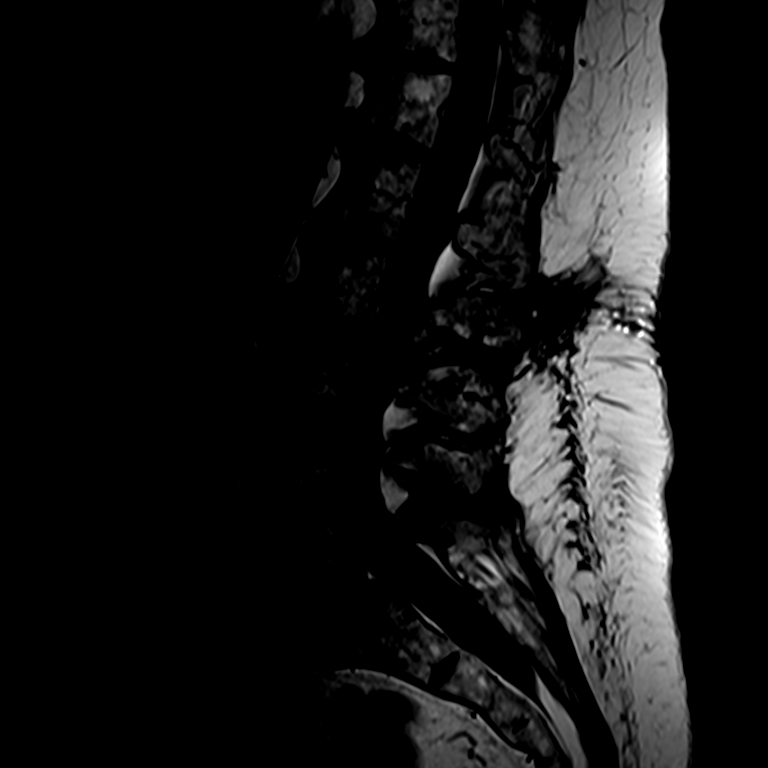
[im 15/15]
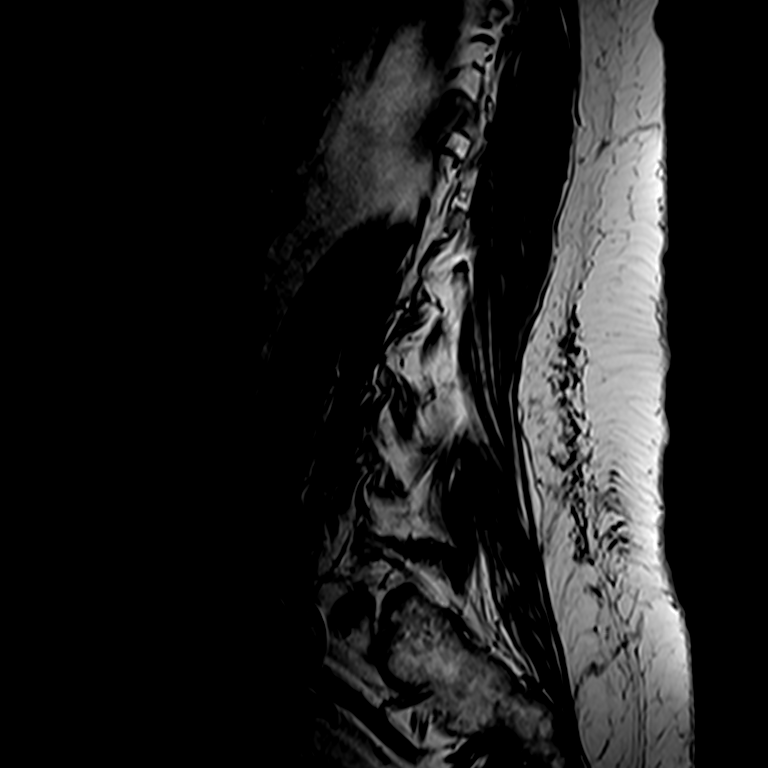

[Series 5: T2 · axial · 4.0mm · 0.24mm/px · z∈[-191,-45]mm · 3 of 39 slices shown]
[im 5/39]
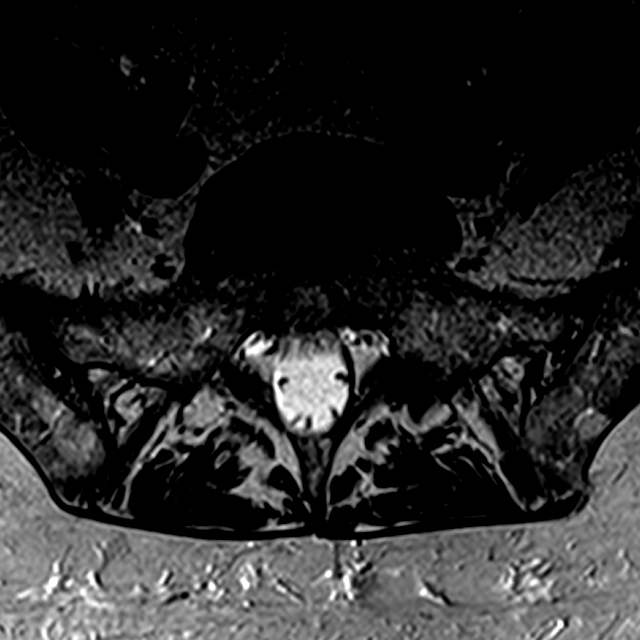
[im 22/39]
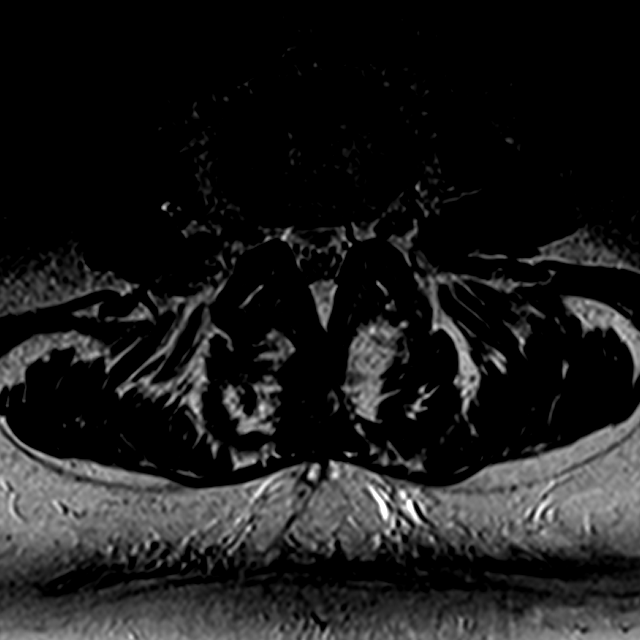
[im 34/39]
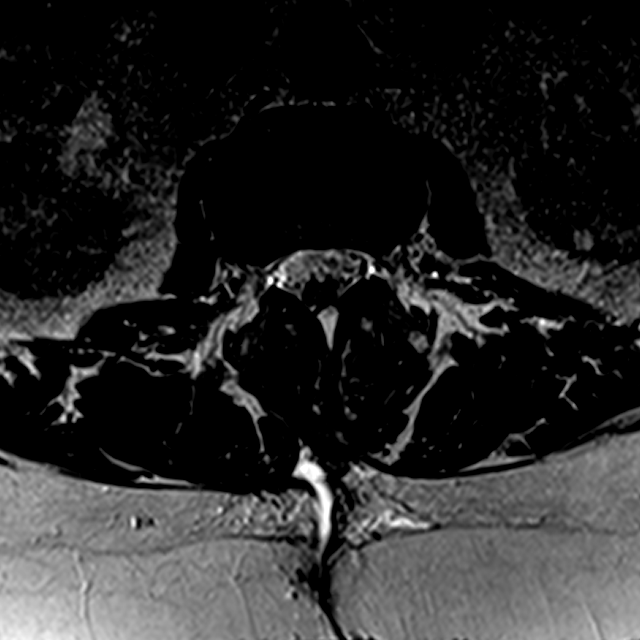

[Series 6: T1 · axial · 4.0mm · 0.22mm/px · z∈[-191,-97]mm · 2 of 39 slices shown (2 of 2)]
[im 5/39]
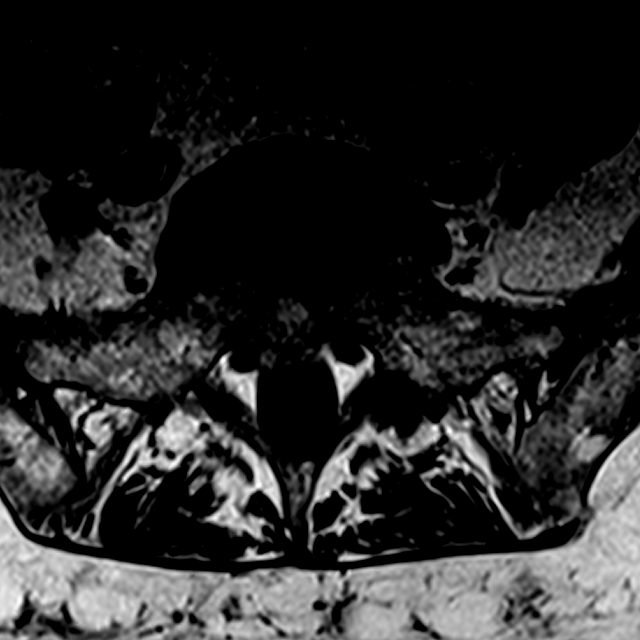
[im 22/39]
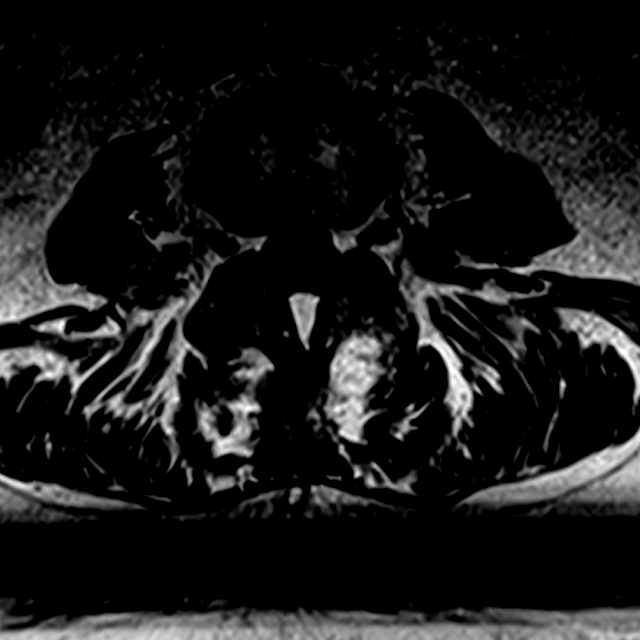

[Series 10: T2 post-contrast · sagittal · 4.0mm · 0.51mm/px · 3 of 17 slices shown]
[im 1/17]
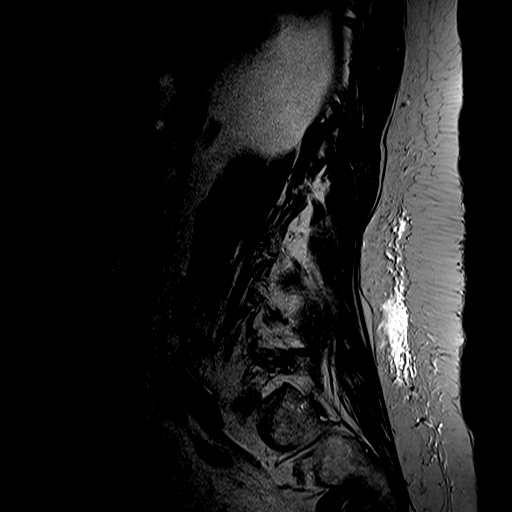
[im 9/17]
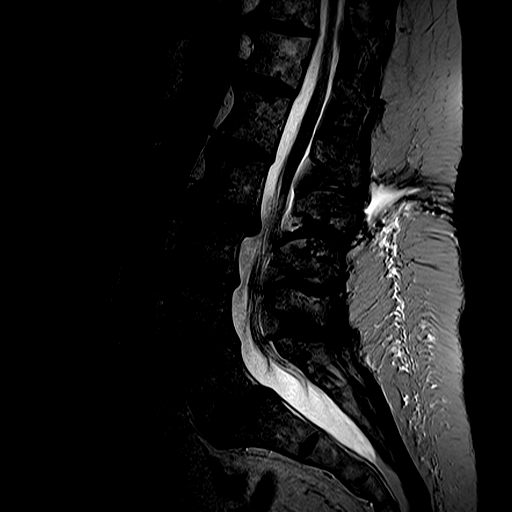
[im 17/17]
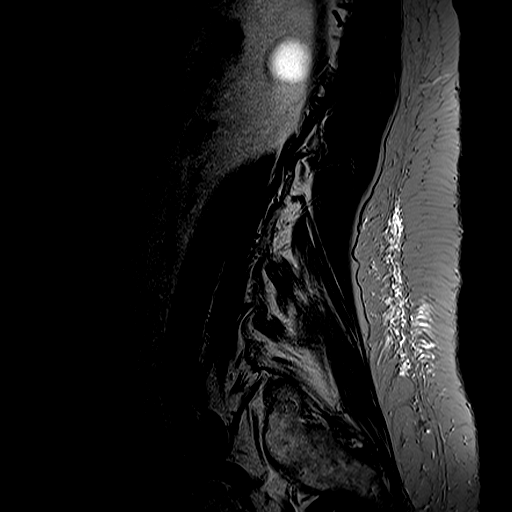

[11 of 48 positions shown; findings below may reference images not displayed]

FINDINGS: Segmentation: Normal segmentation. Lowest well-formed disc labeled
the L5-S1 level. Same numbering system is employed as on previous
exam.

Alignment: 3-4 mm retrolisthesis of L2 on L3, unchanged. Stable 5 mm
anterolisthesis of L5 on S1. Vertebral bodies otherwise normally
aligned with preservation of the normal lumbar lordosis.

Vertebrae: Vertebral body heights maintained. No evidence for acute
or interval fracture. Signal intensity within the vertebral body
bone marrow is within normal limits. No discrete or worrisome
osseous lesions.

Conus medullaris: Extends to the L2 level and appears normal.

Paraspinal and other soft tissues: Postoperative changes from recent
laminectomy with micro discectomy at L2-3. Small residual collection
measuring 2.9 x 0.9 x 2.3 cm extends along the midline incision
(series 5, image 6), most consistent with a benign postoperative
seroma. Paraspinous soft tissues demonstrate no other acute
abnormality. Subcentimeter T2 hyperintense simple cyst noted within
the left kidney. Visualized visceral structures are otherwise
unremarkable.

Disc levels:

L1-2: Mild annular disc bulge with disc desiccation. No canal or
foraminal stenosis.

L2-3: Retrolisthesis. Diffuse disc bulge with intervertebral disc
space narrowing and chronic reactive endplate changes. Postoperative
changes from interval right hemi laminectomy with micro discectomy.
Residual broad posterior disc bulge, similar to previous. Slight
caudad angulation at the level of the left lateral recess.
Previously seen right subarticular disc extrusion has largely been
resected, with no significant component now seen. Postoperative
granulation tissue surrounds the descending right L3 nerve root
without impingement. Residual left-sided facet and ligamentum flavum
hypertrophy. Resultant mild to moderate left lateral recess
stenosis, slightly improved from preoperative exam. No significant
canal narrowing. Stable mild bilateral L2 foraminal stenosis.

L3-4: Mild diffuse disc bulge with disc desiccation. Mild facet and
ligamentum flavum hypertrophy. Resultant mild canal stenosis,
unchanged. No significant foraminal encroachment.

L4-5: Mild diffuse disc bulge with disc desiccation. Mild bilateral
facet and ligamentum flavum hypertrophy. No significant stenosis.

L5-S1: Stable anterolisthesis. Moderate bilateral facet arthrosis.
No significant canal or foraminal stenosis. Appearance is stable.
IMPRESSION: 1. Sequelae of interval right hemi laminectomy with micro discectomy
at L2-3 with resection of right subarticular disc extrusion.
Postoperative granulation tissue surrounds the descending right L3
nerve root without impingement. Residual broad disc bulge with
left-sided facet hypertrophy results in mild to moderate left
subarticular stenosis, improved from previous.
2. Disc bulge and facet hypertrophy at L3-4 with resultant mild
canal stenosis, stable.
3. Stable degenerative spondylolysis at L4-5 and L5-S1 without
significant stenosis or impingement.

## 2018-08-17 DIAGNOSIS — M545 Low back pain: Secondary | ICD-10-CM | POA: Diagnosis not present

## 2018-08-17 DIAGNOSIS — M47816 Spondylosis without myelopathy or radiculopathy, lumbar region: Secondary | ICD-10-CM | POA: Diagnosis not present

## 2018-09-05 DIAGNOSIS — G2581 Restless legs syndrome: Secondary | ICD-10-CM | POA: Diagnosis not present

## 2018-09-05 DIAGNOSIS — Z6841 Body Mass Index (BMI) 40.0 and over, adult: Secondary | ICD-10-CM | POA: Diagnosis not present

## 2018-09-05 DIAGNOSIS — R790 Abnormal level of blood mineral: Secondary | ICD-10-CM | POA: Diagnosis not present

## 2018-09-05 DIAGNOSIS — R6889 Other general symptoms and signs: Secondary | ICD-10-CM | POA: Diagnosis not present

## 2018-09-19 DIAGNOSIS — M545 Low back pain: Secondary | ICD-10-CM | POA: Diagnosis not present

## 2018-09-19 DIAGNOSIS — M47816 Spondylosis without myelopathy or radiculopathy, lumbar region: Secondary | ICD-10-CM | POA: Diagnosis not present

## 2018-10-06 ENCOUNTER — Encounter: Payer: Self-pay | Admitting: Cardiology

## 2018-10-06 ENCOUNTER — Telehealth: Payer: Self-pay | Admitting: Cardiology

## 2018-10-06 ENCOUNTER — Ambulatory Visit: Payer: Medicare HMO | Admitting: Cardiology

## 2018-10-06 VITALS — BP 150/92 | HR 63 | Ht 65.0 in | Wt 246.6 lb

## 2018-10-06 DIAGNOSIS — I2510 Atherosclerotic heart disease of native coronary artery without angina pectoris: Secondary | ICD-10-CM | POA: Diagnosis not present

## 2018-10-06 DIAGNOSIS — I739 Peripheral vascular disease, unspecified: Secondary | ICD-10-CM

## 2018-10-06 DIAGNOSIS — Z789 Other specified health status: Secondary | ICD-10-CM | POA: Diagnosis not present

## 2018-10-06 DIAGNOSIS — I10 Essential (primary) hypertension: Secondary | ICD-10-CM | POA: Diagnosis not present

## 2018-10-06 DIAGNOSIS — I779 Disorder of arteries and arterioles, unspecified: Secondary | ICD-10-CM

## 2018-10-06 NOTE — Telephone Encounter (Signed)
°  Precert needed for: Carotid Doppler dx: known disease   Location: [facility]     Date: Oct 26, 2018

## 2018-10-06 NOTE — Progress Notes (Signed)
Cardiology Office Note  Date: 10/06/2018   ID: AMYRI FRENZ, DOB May 26, 1936, MRN [id number]  PCP: Manon Hilding, MD  Primary Cardiologist: [name], MD   Chief Complaint  Patient presents with  . Cardiac follow-up    History of Present Illness: Renee Dudley is an 83 y.o. female last seen in January 2019.  She presents for a routine follow-up visit.  She does not report any angina symptoms or palpitations over the last year.  Still remains functional with ADLs.  She has had a lot of trouble with back pain and is following with an orthopedist for pain control.  I personally reviewed her ECG today which shows sinus bradycardia.  We went over her medications which are outlined below.  She does not report any intolerances.  No dizziness or syncope.  She is following with Dr. Quintin Alto for primary care at this point.  Past Medical History:  Diagnosis Date  . Anxiety   . Chronic headaches   . Coronary atherosclerosis of native coronary artery    Nonobstructive at cardiac catheterization 2007   . Depression   . DJD (degenerative joint disease)   . Dyspnea   . Essential hypertension, benign   . GERD (gastroesophageal reflux disease)   . History of gout   . Mixed hyperlipidemia   . Peripheral edema   . Primary localized osteoarthritis of left knee   . Varicose veins     Past Surgical History:  Procedure Laterality Date  . APPENDECTOMY    . Cataract surgery Bilateral   . CHOLECYSTECTOMY  1986  . COLONOSCOPY    . LUMBAR LAMINECTOMY/DECOMPRESSION MICRODISCECTOMY Right 02/23/2017   Procedure: Right Lumbar Two-Three Microdiscectomy;  Surgeon: Kary Kos, MD;  Location: [facility];  Service: Neurosurgery;  Laterality: Right;  . Right total knee arthroplasty  2011  . TOTAL ABDOMINAL HYSTERECTOMY W/ BILATERAL SALPINGOOPHORECTOMY  1988  . TOTAL KNEE ARTHROPLASTY Left 12/23/2014   Procedure: LEFT TOTAL KNEE ARTHROPLASTY;  Surgeon: Elsie Saas, MD;  Location: [facility];   Service: Orthopedics;  Laterality: Left;    Current Outpatient Medications  Medication Sig Dispense Refill  . ALPRAZolam (XANAX) 0.25 MG tablet Take 0.5 mg by mouth 2 (two) times daily.     [name] atenolol (TENORMIN) 50 MG tablet Take 50 mg by mouth daily.    . cholecalciferol (VITAMIN D) 1000 units tablet Take 2,000 Units by mouth daily.    [name] docusate sodium (COLACE) 100 MG capsule Take 400 mg by mouth at bedtime.    . furosemide (LASIX) 40 MG tablet Take 40 mg by mouth daily.      . pramipexole (MIRAPEX) 0.25 MG tablet Take 0.25 mg by mouth at bedtime.     . Probiotic Product (ALIGN) 4 MG CAPS Take 4 mg by mouth daily.    . ranitidine (ZANTAC) 150 MG tablet Take 150 mg by mouth 2 (two) times daily.     . sertraline (ZOLOFT) 25 MG tablet Take 25 mg by mouth at bedtime.     . traMADol (ULTRAM) 50 MG tablet Take 50 mg by mouth every 6 (six) hours as needed.     No current facility-administered medications for this visit.    Allergies:  Penicillins; Levofloxacin; Zocor [simvastatin]; and Sulfa antibiotics   Social History: The patient  reports that she has never smoked. She has never used smokeless tobacco. She reports that she does not drink alcohol or use drugs.   ROS:  Please see the history of present  illness. Otherwise, complete review of systems is positive for back pain.  All other systems are reviewed and negative.   Physical Exam: VS:  BP (!) 150/92   Pulse 63   Ht 5\' 5"  (1.651 m)   Wt 246 lb 9.6 oz (111.9 kg)   SpO2 98%   BMI 41.04 kg/m , BMI Body mass index is 41.04 kg/m.  Wt Readings from Last 3 Encounters:  10/06/18 246 lb 9.6 oz (111.9 kg)  09/16/17 236 lb 6.4 oz (107.2 kg)  02/23/17 243 lb (110.2 kg)    General: Elderly woman, appears comfortable at rest. HEENT: Conjunctiva and lids normal, oropharynx clear. Neck: Supple, no elevated JVP or carotid bruits, no thyromegaly. Lungs: Clear to auscultation, nonlabored breathing at rest. Cardiac: Regular rate and rhythm,  no S3 or significant systolic murmur. Abdomen: Soft, nontender, bowel sounds present. Extremities: No pitting edema, distal pulses 2+.  ECG: I personally reviewed the tracing from 09/16/2017 which showed sinus bradycardia.  Recent Labwork:  02/23/2017: BUN 19; Hemoglobin 11.9; Platelets 211; Potassium 3.8; Sodium 140 05/19/2017: Creatinine, Ser 1.40   Other Studies Reviewed Today:  Lexiscan Cardiolite February 2015: No diagnostic ST segment changes, breast attenuation without clear evidence of scar or ischemia, LVEF 77%.  Assessment and Plan:  1.  Nonobstructive CAD by prior work-up.  ECG is normal and she reports no active angina symptoms.  Continue aspirin and beta-blocker.  She has a statin intolerance.  2.  Essential hypertension, blood pressure is mildly increased today.  Keep follow-up with Dr. Quintin Alto.  No changes were made in her regimen.  3.  History of mild carotid artery atherosclerosis by previous assessment in 2015.  Follow-up carotid Dopplers will be obtained.  Current medicines were reviewed with the patient today.   Orders Placed This Encounter  Procedures  . EKG 12-Lead    Disposition: Follow-up in 1 year.  Signed, Satira Sark, MD, [facility] 10/06/2018 3:54 PM    [facility] at [place], [place], [place] [address] Phone: [phone]; Fax: [phone]

## 2018-10-06 NOTE — Patient Instructions (Addendum)
Medication Instructions:   Your physician recommends that you continue on your current medications as directed. Please refer to the Current Medication list given to you today.  Labwork:  NONE  Testing/Procedures: Your physician has requested that you have a carotid duplex. This test is an ultrasound of the carotid arteries in your neck. It looks at blood flow through these arteries that supply the brain with blood. Allow one hour for this exam. There are no restrictions or special instructions.  Follow-Up:  Your physician recommends that you schedule a follow-up appointment in: 1 year. You will receive a reminder letter in the mail in about 10 months reminding you to call and schedule your appointment. If you don't receive this letter, please contact our office.  Any Other Special Instructions Will Be Listed Below (If Applicable).  If you need a refill on your cardiac medications before your next appointment, please call your pharmacy. 

## 2018-10-09 DIAGNOSIS — I10 Essential (primary) hypertension: Secondary | ICD-10-CM | POA: Diagnosis not present

## 2018-10-09 DIAGNOSIS — K219 Gastro-esophageal reflux disease without esophagitis: Secondary | ICD-10-CM | POA: Diagnosis not present

## 2018-10-09 DIAGNOSIS — E559 Vitamin D deficiency, unspecified: Secondary | ICD-10-CM | POA: Diagnosis not present

## 2018-10-09 DIAGNOSIS — N183 Chronic kidney disease, stage 3 (moderate): Secondary | ICD-10-CM | POA: Diagnosis not present

## 2018-10-09 DIAGNOSIS — R300 Dysuria: Secondary | ICD-10-CM | POA: Diagnosis not present

## 2018-10-12 DIAGNOSIS — Z0001 Encounter for general adult medical examination with abnormal findings: Secondary | ICD-10-CM | POA: Diagnosis not present

## 2018-10-12 DIAGNOSIS — Z6841 Body Mass Index (BMI) 40.0 and over, adult: Secondary | ICD-10-CM | POA: Diagnosis not present

## 2018-10-12 DIAGNOSIS — I10 Essential (primary) hypertension: Secondary | ICD-10-CM | POA: Diagnosis not present

## 2018-10-18 DIAGNOSIS — M47816 Spondylosis without myelopathy or radiculopathy, lumbar region: Secondary | ICD-10-CM | POA: Diagnosis not present

## 2018-10-18 DIAGNOSIS — M545 Low back pain: Secondary | ICD-10-CM | POA: Diagnosis not present

## 2018-10-25 ENCOUNTER — Telehealth: Payer: Self-pay | Admitting: *Deleted

## 2018-10-25 ENCOUNTER — Ambulatory Visit (INDEPENDENT_AMBULATORY_CARE_PROVIDER_SITE_OTHER): Payer: Medicare HMO

## 2018-10-25 DIAGNOSIS — R300 Dysuria: Secondary | ICD-10-CM | POA: Diagnosis not present

## 2018-10-25 DIAGNOSIS — I739 Peripheral vascular disease, unspecified: Secondary | ICD-10-CM

## 2018-10-25 DIAGNOSIS — I779 Disorder of arteries and arterioles, unspecified: Secondary | ICD-10-CM

## 2018-10-25 DIAGNOSIS — Z6841 Body Mass Index (BMI) 40.0 and over, adult: Secondary | ICD-10-CM | POA: Diagnosis not present

## 2018-10-25 NOTE — Telephone Encounter (Signed)
Patient informed. Copy sent to PCP °

## 2018-10-25 NOTE — Telephone Encounter (Signed)
-----   Message from Satira Sark, MD sent at 10/25/2018  2:39 PM EST ----- Results reviewed.  Carotid Dopplers shows stable, mild carotid artery atherosclerosis.  Continue with current follow-up plan. A copy of this test should be forwarded to Manon Hilding, MD.

## 2018-11-08 DIAGNOSIS — R300 Dysuria: Secondary | ICD-10-CM | POA: Diagnosis not present

## 2018-12-27 DIAGNOSIS — I10 Essential (primary) hypertension: Secondary | ICD-10-CM | POA: Diagnosis not present

## 2018-12-27 DIAGNOSIS — K219 Gastro-esophageal reflux disease without esophagitis: Secondary | ICD-10-CM | POA: Diagnosis not present

## 2018-12-27 DIAGNOSIS — Z79899 Other long term (current) drug therapy: Secondary | ICD-10-CM | POA: Diagnosis not present

## 2018-12-27 DIAGNOSIS — M109 Gout, unspecified: Secondary | ICD-10-CM | POA: Diagnosis not present

## 2018-12-27 DIAGNOSIS — R69 Illness, unspecified: Secondary | ICD-10-CM | POA: Diagnosis not present

## 2019-01-04 DIAGNOSIS — R300 Dysuria: Secondary | ICD-10-CM | POA: Diagnosis not present

## 2019-01-04 DIAGNOSIS — M109 Gout, unspecified: Secondary | ICD-10-CM | POA: Diagnosis not present

## 2019-01-04 DIAGNOSIS — Z6841 Body Mass Index (BMI) 40.0 and over, adult: Secondary | ICD-10-CM | POA: Diagnosis not present

## 2019-01-16 DIAGNOSIS — M109 Gout, unspecified: Secondary | ICD-10-CM | POA: Diagnosis not present

## 2019-01-16 DIAGNOSIS — Z6841 Body Mass Index (BMI) 40.0 and over, adult: Secondary | ICD-10-CM | POA: Diagnosis not present

## 2019-01-16 DIAGNOSIS — N3090 Cystitis, unspecified without hematuria: Secondary | ICD-10-CM | POA: Diagnosis not present

## 2019-01-16 DIAGNOSIS — R8279 Other abnormal findings on microbiological examination of urine: Secondary | ICD-10-CM | POA: Diagnosis not present

## 2019-01-30 DIAGNOSIS — H26492 Other secondary cataract, left eye: Secondary | ICD-10-CM | POA: Diagnosis not present

## 2019-02-12 DIAGNOSIS — M109 Gout, unspecified: Secondary | ICD-10-CM | POA: Diagnosis not present

## 2019-02-12 DIAGNOSIS — Z978 Presence of other specified devices: Secondary | ICD-10-CM | POA: Diagnosis not present

## 2019-02-12 DIAGNOSIS — M79672 Pain in left foot: Secondary | ICD-10-CM | POA: Diagnosis not present

## 2019-02-12 DIAGNOSIS — R300 Dysuria: Secondary | ICD-10-CM | POA: Diagnosis not present

## 2019-03-05 DIAGNOSIS — M109 Gout, unspecified: Secondary | ICD-10-CM | POA: Diagnosis not present

## 2019-03-05 DIAGNOSIS — N3090 Cystitis, unspecified without hematuria: Secondary | ICD-10-CM | POA: Diagnosis not present

## 2019-03-05 DIAGNOSIS — Z6841 Body Mass Index (BMI) 40.0 and over, adult: Secondary | ICD-10-CM | POA: Diagnosis not present

## 2019-03-13 DIAGNOSIS — S92909A Unspecified fracture of unspecified foot, initial encounter for closed fracture: Secondary | ICD-10-CM | POA: Diagnosis not present

## 2019-04-06 DIAGNOSIS — R760 Raised antibody titer: Secondary | ICD-10-CM | POA: Diagnosis not present

## 2019-04-06 DIAGNOSIS — E790 Hyperuricemia without signs of inflammatory arthritis and tophaceous disease: Secondary | ICD-10-CM | POA: Diagnosis not present

## 2019-04-06 DIAGNOSIS — R5383 Other fatigue: Secondary | ICD-10-CM | POA: Diagnosis not present

## 2019-04-06 DIAGNOSIS — I10 Essential (primary) hypertension: Secondary | ICD-10-CM | POA: Diagnosis not present

## 2019-04-06 DIAGNOSIS — N183 Chronic kidney disease, stage 3 (moderate): Secondary | ICD-10-CM | POA: Diagnosis not present

## 2019-04-06 DIAGNOSIS — G933 Postviral fatigue syndrome: Secondary | ICD-10-CM | POA: Diagnosis not present

## 2019-04-06 DIAGNOSIS — K219 Gastro-esophageal reflux disease without esophagitis: Secondary | ICD-10-CM | POA: Diagnosis not present

## 2019-04-11 DIAGNOSIS — Z6841 Body Mass Index (BMI) 40.0 and over, adult: Secondary | ICD-10-CM | POA: Diagnosis not present

## 2019-04-11 DIAGNOSIS — N183 Chronic kidney disease, stage 3 (moderate): Secondary | ICD-10-CM | POA: Diagnosis not present

## 2019-04-11 DIAGNOSIS — K580 Irritable bowel syndrome with diarrhea: Secondary | ICD-10-CM | POA: Diagnosis not present

## 2019-04-11 DIAGNOSIS — R5383 Other fatigue: Secondary | ICD-10-CM | POA: Diagnosis not present

## 2019-04-11 DIAGNOSIS — I10 Essential (primary) hypertension: Secondary | ICD-10-CM | POA: Diagnosis not present

## 2019-04-11 DIAGNOSIS — M109 Gout, unspecified: Secondary | ICD-10-CM | POA: Diagnosis not present

## 2019-04-11 DIAGNOSIS — M79643 Pain in unspecified hand: Secondary | ICD-10-CM | POA: Diagnosis not present

## 2019-04-11 DIAGNOSIS — S92919A Unspecified fracture of unspecified toe(s), initial encounter for closed fracture: Secondary | ICD-10-CM | POA: Diagnosis not present

## 2019-05-17 DIAGNOSIS — Z5321 Procedure and treatment not carried out due to patient leaving prior to being seen by health care provider: Secondary | ICD-10-CM | POA: Diagnosis not present

## 2019-05-17 DIAGNOSIS — R0602 Shortness of breath: Secondary | ICD-10-CM | POA: Diagnosis not present

## 2019-06-26 DIAGNOSIS — Z23 Encounter for immunization: Secondary | ICD-10-CM | POA: Diagnosis not present

## 2019-07-31 DIAGNOSIS — Z961 Presence of intraocular lens: Secondary | ICD-10-CM | POA: Diagnosis not present

## 2019-07-31 DIAGNOSIS — H26492 Other secondary cataract, left eye: Secondary | ICD-10-CM | POA: Diagnosis not present

## 2019-07-31 DIAGNOSIS — H5203 Hypermetropia, bilateral: Secondary | ICD-10-CM | POA: Diagnosis not present

## 2019-08-16 DIAGNOSIS — H26492 Other secondary cataract, left eye: Secondary | ICD-10-CM | POA: Diagnosis not present

## 2019-08-28 DIAGNOSIS — H26492 Other secondary cataract, left eye: Secondary | ICD-10-CM | POA: Diagnosis not present

## 2019-12-31 ENCOUNTER — Other Ambulatory Visit: Payer: Self-pay

## 2019-12-31 ENCOUNTER — Encounter: Payer: Self-pay | Admitting: *Deleted

## 2019-12-31 ENCOUNTER — Ambulatory Visit: Payer: Medicare HMO | Admitting: Cardiology

## 2019-12-31 ENCOUNTER — Encounter: Payer: Self-pay | Admitting: Cardiology

## 2019-12-31 VITALS — BP 110/70 | HR 50 | Ht 65.0 in | Wt 231.2 lb

## 2019-12-31 DIAGNOSIS — I6523 Occlusion and stenosis of bilateral carotid arteries: Secondary | ICD-10-CM | POA: Diagnosis not present

## 2019-12-31 DIAGNOSIS — I2510 Atherosclerotic heart disease of native coronary artery without angina pectoris: Secondary | ICD-10-CM

## 2019-12-31 DIAGNOSIS — I10 Essential (primary) hypertension: Secondary | ICD-10-CM | POA: Diagnosis not present

## 2019-12-31 MED ORDER — ASPIRIN EC 81 MG PO TBEC: 81.0000 mg | DELAYED_RELEASE_TABLET | Freq: Every day | ORAL | 3 refills | Status: DC

## 2019-12-31 NOTE — Patient Instructions (Addendum)
Medication Instructions:   Your physician has recommended you make the following change in your medication:   Start aspirin 81 mg by mouth daily  Continue other medications the same  Labwork:  NONE  Testing/Procedures:  NONE  Follow-Up:  Your physician recommends that you schedule a follow-up appointment in: 1 year (office). You will receive a reminder letter in the mail in about 10 months reminding you to call and schedule your appointment. If you don't receive this letter, please contact our office.  Any Other Special Instructions Will Be Listed Below (If Applicable).  If you need a refill on your cardiac medications before your next appointment, please call your pharmacy. 

## 2019-12-31 NOTE — Progress Notes (Signed)
Cardiology Office Note  Date: 12/31/2019   ID: NOE RUBALCAVA, DOB 19-Jul-1936, MRN [id number]  PCP:  Manon Hilding, MD  Cardiologist:  [name], MD Electrophysiologist:  None   Chief Complaint  Patient presents with  . Cardiac follow-up    History of Present Illness: Renee Dudley is an 84 y.o. female last seen in January 2020.  She presents for a routine visit.  She tells me that she has been bothered by progressive arthritic pain, lower back pain.  She has not had any angina however, no palpitations or worsening dyspnea.  I reviewed her medications which are outlined below.  She continues on atenolol, has prior intolerance to Zocor.  We did discuss starting a low-dose aspirin in light of atherosclerosis, both coronary and carotid. She did have follow-up carotid Dopplers last year which showed mild disease.  I personally reviewed her ECG today which shows sinus bradycardia at 50, she is asymptomatic.  Past Medical History:  Diagnosis Date  . Anxiety   . Chronic headaches   . Coronary atherosclerosis of native coronary artery    Nonobstructive at cardiac catheterization 2007   . Depression   . DJD (degenerative joint disease)   . Essential hypertension   . GERD (gastroesophageal reflux disease)   . History of gout   . Mixed hyperlipidemia   . Peripheral edema   . Primary localized osteoarthritis of left knee   . Varicose veins     Past Surgical History:  Procedure Laterality Date  . APPENDECTOMY    . Cataract surgery Bilateral   . CHOLECYSTECTOMY  1986  . COLONOSCOPY    . LUMBAR LAMINECTOMY/DECOMPRESSION MICRODISCECTOMY Right 02/23/2017   Procedure: Right Lumbar Two-Three Microdiscectomy;  Surgeon: Kary Kos, MD;  Location: [facility];  Service: Neurosurgery;  Laterality: Right;  . Right total knee arthroplasty  2011  . TOTAL ABDOMINAL HYSTERECTOMY W/ BILATERAL SALPINGOOPHORECTOMY  1988  . TOTAL KNEE ARTHROPLASTY Left 12/23/2014   Procedure: LEFT TOTAL  KNEE ARTHROPLASTY;  Surgeon: Elsie Saas, MD;  Location: [facility] [place];  Service: Orthopedics;  Laterality: Left;    Current Outpatient Medications  Medication Sig Dispense Refill  . allopurinol (ZYLOPRIM) 100 MG tablet Take 100 mg by mouth daily.    [name] ALPRAZolam (XANAX) 0.25 MG tablet Take 0.5 mg by mouth 2 (two) times daily.     [name] atenolol (TENORMIN) 50 MG tablet Take 50 mg by mouth daily.    . cholecalciferol (VITAMIN D) 1000 units tablet Take 2,000 Units by mouth daily.    [name] docusate sodium (COLACE) 100 MG capsule Take 400 mg by mouth at bedtime.    . famotidine (PEPCID) 20 MG tablet Take 20 mg by mouth 2 (two) times daily.    . febuxostat (ULORIC) 40 MG tablet Take 40 mg by mouth daily.    . furosemide (LASIX) 40 MG tablet Take 40 mg by mouth daily.      . pramipexole (MIRAPEX) 0.25 MG tablet Take 0.25 mg by mouth at bedtime.     . Probiotic Product (ALIGN) 4 MG CAPS Take 4 mg by mouth daily.    . sertraline (ZOLOFT) 25 MG tablet Take 25 mg by mouth at bedtime.     . traMADol (ULTRAM) 50 MG tablet Take 50 mg by mouth every 6 (six) hours as needed.    [name] aspirin EC 81 MG tablet Take 1 tablet (81 mg total) by mouth daily. 90 tablet 3   No current facility-administered medications for this visit.  Allergies:  Penicillins, Levofloxacin, Zocor [simvastatin], and Sulfa antibiotics   ROS:   Chronic lower back pain.  Physical Exam: VS:  BP 110/70   Pulse (!) 50   Ht 5\' 5"  (1.651 m)   Wt 231 lb 3.2 oz (104.9 kg)   SpO2 98%   BMI 38.47 kg/m , BMI Body mass index is 38.47 kg/m.  Wt Readings from Last 3 Encounters:  12/31/19 231 lb 3.2 oz (104.9 kg)  10/06/18 246 lb 9.6 oz (111.9 kg)  09/16/17 236 lb 6.4 oz (107.2 kg)    General: Elderly woman, appears comfortable at rest. HEENT: Conjunctiva and lids normal, wearing a mask. Neck: Supple, no elevated JVP or carotid bruits, no thyromegaly. Lungs: Clear to auscultation, nonlabored breathing at rest. Cardiac: Regular rate and rhythm, no S3  or significant systolic murmur, no pericardial rub. Abdomen: Soft, nontender,bowel sounds present. Extremities: No pitting edema, distal pulses 2+.  ECG:  An ECG dated 10/06/2018 was personally reviewed today and demonstrated:  Sinus bradycardia.  Recent Labwork:  No interval lab work for review today.  Other Studies Reviewed Today:  Lexiscan Cardiolite February 2015: No diagnostic ST segment changes, breast attenuation without clear evidence of scar or ischemia, LVEF 77%.  Carotid Dopplers 10/25/2018: Summary:  Right Carotid: Velocities in the right ICA are consistent with a 1-39%  stenosis.   Left Carotid: Velocities in the left ICA are consistent with a 1-39%  stenosis.   Vertebrals: Bilateral vertebral arteries demonstrate antegrade flow.  Subclavians: Normal flow hemodynamics were seen in bilateral subclavian        arteries.   Assessment and Plan:  1.  History of nonobstructive CAD, no active angina symptoms at this time, ECG reviewed and stable.  In light of this and carotid atherosclerosis, recommended that she start on a baby aspirin daily, otherwise continue current dose of atenolol.  2.  Mild, asymptomatic carotid atherosclerosis.  3.  History of statin intolerance, mainly Zocor.  We will obtain most recent lipid panel from Dr. Quintin Alto.  Can discuss other options for treatment to reduce atherosclerotic risk.  Medication Adjustments/Labs and Tests Ordered: Current medicines are reviewed at length with the patient today.  Concerns regarding medicines are outlined above.   Tests Ordered: Orders Placed This Encounter  Procedures  . EKG 12-Lead    Medication Changes: Meds ordered this encounter  Medications  . aspirin EC 81 MG tablet    Sig: Take 1 tablet (81 mg total) by mouth daily.    Dispense:  90 tablet    Refill:  3    Disposition:  Follow up 1 year in the [place] office.  Signed, Satira Sark, MD, [facility] 12/31/2019 1:50 PM    [facility] [place], [place], [place] [address] Phone: [phone]; Fax: [phone]

## 2020-03-08 ENCOUNTER — Emergency Department (HOSPITAL_COMMUNITY): Payer: Medicare HMO

## 2020-03-08 ENCOUNTER — Encounter (HOSPITAL_COMMUNITY): Payer: Self-pay | Admitting: Emergency Medicine

## 2020-03-08 ENCOUNTER — Other Ambulatory Visit: Payer: Self-pay

## 2020-03-08 ENCOUNTER — Emergency Department (HOSPITAL_COMMUNITY)
Admission: EM | Admit: 2020-03-08 | Discharge: 2020-03-08 | Disposition: A | Discharge status: Home / Self Care | Payer: Medicare HMO | Attending: Emergency Medicine | Admitting: Emergency Medicine

## 2020-03-08 DIAGNOSIS — R072 Precordial pain: Secondary | ICD-10-CM | POA: Insufficient documentation

## 2020-03-08 DIAGNOSIS — E785 Hyperlipidemia, unspecified: Secondary | ICD-10-CM | POA: Diagnosis not present

## 2020-03-08 DIAGNOSIS — I10 Essential (primary) hypertension: Secondary | ICD-10-CM | POA: Diagnosis not present

## 2020-03-08 DIAGNOSIS — Z7982 Long term (current) use of aspirin: Secondary | ICD-10-CM | POA: Insufficient documentation

## 2020-03-08 DIAGNOSIS — Z955 Presence of coronary angioplasty implant and graft: Secondary | ICD-10-CM | POA: Diagnosis not present

## 2020-03-08 DIAGNOSIS — Z8679 Personal history of other diseases of the circulatory system: Secondary | ICD-10-CM | POA: Diagnosis not present

## 2020-03-08 LAB — CBC
HCT: 41.3 % (ref 36.0–46.0)
Hemoglobin: 13.5 g/dL (ref 12.0–15.0)
MCH: 31.6 pg (ref 26.0–34.0)
MCHC: 32.7 g/dL (ref 30.0–36.0)
MCV: 96.7 fL (ref 80.0–100.0)
Platelets: 200 10*3/uL (ref 150–400)
RBC: 4.27 MIL/uL (ref 3.87–5.11)
RDW: 13.3 % (ref 11.5–15.5)
WBC: 5.7 10*3/uL (ref 4.0–10.5)
nRBC: 0 % (ref 0.0–0.2)

## 2020-03-08 LAB — BASIC METABOLIC PANEL
Anion gap: 10 (ref 5–15)
BUN: 25 mg/dL — ABNORMAL HIGH (ref 8–23)
CO2: 27 mmol/L (ref 22–32)
Calcium: 9.4 mg/dL (ref 8.9–10.3)
Chloride: 101 mmol/L (ref 98–111)
Creatinine, Ser: 1.05 mg/dL — ABNORMAL HIGH (ref 0.44–1.00)
GFR calc Af Amer: 58 mL/min — ABNORMAL LOW (ref >60)
GFR calc non Af Amer: 50 mL/min — ABNORMAL LOW (ref >60)
Glucose, Bld: 103 mg/dL — ABNORMAL HIGH (ref 70–99)
Potassium: 3.8 mmol/L (ref 3.5–5.1)
Sodium: 138 mmol/L (ref 135–145)

## 2020-03-08 LAB — TROPONIN I (HIGH SENSITIVITY)
Troponin I (High Sensitivity): 5 ng/L
Troponin I (High Sensitivity): 5 ng/L
Troponin I (High Sensitivity): 6 ng/L

## 2020-03-08 MED ORDER — SODIUM CHLORIDE 0.9% FLUSH: 3.0000 mL | Freq: Once | INTRAVENOUS | Status: DC

## 2020-03-08 NOTE — ED Triage Notes (Addendum)
Patient c/o meid-sternal chest/epigatric pain, non radiating that started today. Per patient shortness of breath that is progressively getting worse. Denies any nausea, vomiting, or dizziness. Denies any cardiac hx. Patient took antiacid at 2pm today with no relief.

## 2020-03-08 NOTE — ED Provider Notes (Signed)
[facility] EMERGENCY DEPARTMENT Provider Note   CSN: [id number] Arrival date & time: 03/08/20  1710     History Chief Complaint  Patient presents with  . Chest Pain    Renee Dudley is a 84 y.o. female.  Patient with onset of substernal chest pain at 1130 this morning.  The pain stopped on its own spontaneously at 1830.  Associated with some shortness of breath.  No nausea vomiting or dizziness.  Took antacids at 2 PM with no relief.  Patient's been followed by cardiology in the past.  Known to have nonischemic coronary artery disease.  Last had a cardiac cath in 2007.  Had a stress test I think in 2015.  This chest pain is unusual for her.     HPI: A 84 year old patient with a history of hypertension, hypercholesterolemia and obesity presents for evaluation of chest pain. Initial onset of pain was approximately 3-6 hours ago. The patient's chest pain is described as heaviness/pressure/tightness and is not worse with exertion. The patient's chest pain is not middle- or left-sided, is not well-localized, is not sharp and does not radiate to the arms/jaw/neck. The patient does not complain of nausea and denies diaphoresis. The patient has no history of stroke, has no history of peripheral artery disease, has not smoked in the past 90 days, denies any history of treated diabetes and has no relevant family history of coronary artery disease (first degree relative at less than age 44).   Past Medical History:  Diagnosis Date  . Anxiety   . Chronic headaches   . Coronary atherosclerosis of native coronary artery    Nonobstructive at cardiac catheterization 2007   . Depression   . DJD (degenerative joint disease)   . Essential hypertension   . GERD (gastroesophageal reflux disease)   . History of gout   . Mixed hyperlipidemia   . Peripheral edema   . Primary localized osteoarthritis of left knee   . Varicose veins     Patient Active Problem List   Diagnosis Date Noted  . HNP  (herniated nucleus pulposus), lumbar 02/23/2017  . DJD (degenerative joint disease) of knee 12/23/2014  . Primary localized osteoarthritis of left knee   . Chest pain 04/11/2014  . UTI (lower urinary tract infection) 04/11/2014  . Carotid bruit 01/07/2014  . Coronary atherosclerosis of native coronary artery 05/14/2011  . Mixed hyperlipidemia 05/14/2011  . Essential hypertension, benign 05/14/2011  . Chest pain 05/14/2011    Past Surgical History:  Procedure Laterality Date  . APPENDECTOMY    . Cataract surgery Bilateral   . CHOLECYSTECTOMY  1986  . COLONOSCOPY    . LUMBAR LAMINECTOMY/DECOMPRESSION MICRODISCECTOMY Right 02/23/2017   Procedure: Right Lumbar Two-Three Microdiscectomy;  Surgeon: Kary Kos, MD;  Location: [facility];  Service: Neurosurgery;  Laterality: Right;  . Right total knee arthroplasty  2011  . TOTAL ABDOMINAL HYSTERECTOMY W/ BILATERAL SALPINGOOPHORECTOMY  1988  . TOTAL KNEE ARTHROPLASTY Left 12/23/2014   Procedure: LEFT TOTAL KNEE ARTHROPLASTY;  Surgeon: Elsie Saas, MD;  Location: [facility];  Service: Orthopedics;  Laterality: Left;     OB History    Gravida  5   Para  5   Term  5   Preterm      AB      Living  5     SAB      TAB      Ectopic      Multiple      Live Births  Family History  Problem Relation Age of Onset  . Stroke Sister        Died in her 13's  . Stroke Father        Died age 61  . Heart failure Mother        Died age 51  . Stroke Mother     Social History   Tobacco Use  . Smoking status: Never Smoker  . Smokeless tobacco: Never Used  Vaping Use  . Vaping Use: Never used  Substance Use Topics  . Alcohol use: No    Alcohol/week: 0.0 standard drinks  . Drug use: No    Home Medications Prior to Admission medications   Medication Sig Start Date End Date Taking? Authorizing Provider  allopurinol (ZYLOPRIM) 100 MG tablet Take 100 mg by mouth daily. 10/22/19  Yes [provider]  ALPRAZolam  (XANAX) 0.25 MG tablet Take 0.5 mg by mouth 2 (two) times daily.    Yes [provider]  aspirin EC 81 MG tablet Take 1 tablet (81 mg total) by mouth daily. 12/31/19  Yes Satira Sark, MD  atenolol (TENORMIN) 50 MG tablet Take 50 mg by mouth daily.   Yes [provider]  cholecalciferol (VITAMIN D) 1000 units tablet Take 2,000 Units by mouth daily.   Yes [provider]  docusate sodium (COLACE) 100 MG capsule Take 400 mg by mouth at bedtime.   Yes [provider]  famotidine (PEPCID) 20 MG tablet Take 20 mg by mouth 2 (two) times daily. 10/12/19  Yes [provider]  febuxostat (ULORIC) 40 MG tablet Take 40 mg by mouth daily. 11/02/19  Yes [provider]  furosemide (LASIX) 40 MG tablet Take 40 mg by mouth daily.     Yes [provider]  MITIGARE 0.6 MG CAPS Take 1 capsule by mouth every 2 (two) hours as needed. 02/19/20  Yes [provider]  pramipexole (MIRAPEX) 0.25 MG tablet Take 0.25 mg by mouth at bedtime.  07/25/16  Yes [provider]  Probiotic Product (ALIGN) 4 MG CAPS Take 4 mg by mouth daily.   Yes [provider]  sertraline (ZOLOFT) 50 MG tablet Take 50 mg by mouth daily. 02/19/20  Yes [provider]  traMADol (ULTRAM) 50 MG tablet Take 50 mg by mouth every 6 (six) hours as needed.   Yes [provider]    Allergies    Penicillins, Levofloxacin, Zocor [simvastatin], and Sulfa antibiotics  Review of Systems   Review of Systems  Constitutional: Negative for chills and fever.  HENT: Negative for congestion, rhinorrhea and sore throat.   Eyes: Negative for visual disturbance.  Respiratory: Positive for shortness of breath. Negative for cough.   Cardiovascular: Positive for chest pain. Negative for leg swelling.  Gastrointestinal: Negative for abdominal pain, diarrhea, nausea and vomiting.  Genitourinary: Negative for dysuria.  Musculoskeletal: Negative for back pain and  neck pain.  Skin: Negative for rash.  Neurological: Negative for dizziness, light-headedness and headaches.  Hematological: Does not bruise/bleed easily.  Psychiatric/Behavioral: Negative for confusion.    Physical Exam Updated Vital Signs BP (!) 130/93 (BP Location: Left Arm)   Pulse 63   Temp 97.9 F (36.6 C) (Oral)   Resp 18   Ht 1.676 m (5\' 6" )   Wt 106.1 kg   SpO2 100%   BMI 37.77 kg/m   Physical Exam Vitals and nursing note reviewed.  Constitutional:      General: She is not in acute distress.  Appearance: Normal appearance. She is well-developed.  HENT:     Head: Normocephalic and atraumatic.  Eyes:     Extraocular Movements: Extraocular movements intact.     Conjunctiva/sclera: Conjunctivae normal.     Pupils: Pupils are equal, round, and reactive to light.  Cardiovascular:     Rate and Rhythm: Normal rate and regular rhythm.     Heart sounds: No murmur heard.   Pulmonary:     Effort: Pulmonary effort is normal. No respiratory distress.     Breath sounds: Normal breath sounds.  Chest:     Chest wall: No tenderness.  Abdominal:     Palpations: Abdomen is soft.     Tenderness: There is no abdominal tenderness.  Musculoskeletal:        General: Normal range of motion.     Cervical back: Normal range of motion and neck supple.  Skin:    General: Skin is warm and dry.     Capillary Refill: Capillary refill takes less than 2 seconds.  Neurological:     General: No focal deficit present.     Mental Status: She is alert and oriented to person, place, and time.     Cranial Nerves: No cranial nerve deficit.     Sensory: No sensory deficit.     Motor: No weakness.     ED Results / Procedures / Treatments   Labs (all labs ordered are listed, but only abnormal results are displayed) Labs Reviewed  BASIC METABOLIC PANEL - Abnormal; Notable for the following components:      Result Value   Glucose, Bld 103 (*)    BUN 25 (*)    Creatinine, Ser 1.05 (*)     GFR calc non Af Amer 50 (*)    GFR calc Af Amer 58 (*)    All other components within normal limits  CBC  TROPONIN I (HIGH SENSITIVITY)  TROPONIN I (HIGH SENSITIVITY)  TROPONIN I (HIGH SENSITIVITY)    EKG EKG Interpretation  Date/Time:  Saturday March 08 2020 17:50:46 EDT Ventricular Rate:  54 PR Interval:    QRS Duration: 94 QT Interval:  428 QTC Calculation: 406 R Axis:   6 Text Interpretation: Sinus rhythm Confirmed by Fredia Sorrow [phone]) on 03/08/2020 9:20:01 PM   Radiology DG Chest 2 View  Result Date: 03/08/2020 CLINICAL DATA:  Chest pain EXAM: CHEST - 2 VIEW COMPARISON:  05/17/2019 FINDINGS: The heart size and mediastinal contours are within normal limits. Areas of atelectasis and scarring are again noted bilaterally. These are essentially stable from prior study. The visualized skeletal structures are unremarkable. IMPRESSION: No active cardiopulmonary disease. Electronically Signed   By: Constance Holster M.D.   On: 03/08/2020 18:18    Procedures Procedures (including critical care time)  Medications Ordered in ED Medications  sodium chloride flush (NS) 0.9 % injection 3 mL (has no administration in time range)    ED Course  I have reviewed the triage vital signs and the nursing notes.  Pertinent labs & imaging results that were available during my care of the patient were reviewed by me and considered in my medical decision making (see chart for details).    MDM Rules/Calculators/A&P HEAR Score: 4                         Patient's work-up for the chest pains had 3 troponins all 3 were negative.  The last one was done at 90 tonight.  Patient now completely  asymptomatic since 6:30 PM.  Based on the heart pathway her heart score was 4.  Patient did not want admission but based on that she could be close follow-up with cardiology or admission.  Patient is followed by Dr. Domenic Polite from cardiology.  And she will make an appointment to follow-up to return for  any new or worse symptoms.  Patient may require further evaluation in 2007 had a cardiac cath with nonischemic coronary artery disease.  Does not normally get chest pain like this.  Chest x-ray without any acute findings.  EKG without any acute changes.  Likely not concerned about pulmonary embolus.  Not tachycardic no hypoxia no persistent shortness of breath.    Final Clinical Impression(s) / ED Diagnoses Final diagnoses:  Precordial pain    Rx / DC Orders ED Discharge Orders    None       Fredia Sorrow, MD 03/08/20 2326

## 2020-03-08 NOTE — Discharge Instructions (Addendum)
Work-up for the chest pain without any acute findings.  Make an appointment to follow-up with cardiology in the next few days.  Return for any recurrent chest pain or new or worse symptoms.

## 2020-03-13 ENCOUNTER — Encounter: Payer: Self-pay | Admitting: Cardiology

## 2020-03-13 ENCOUNTER — Ambulatory Visit: Payer: Medicare HMO | Admitting: Cardiology

## 2020-03-13 ENCOUNTER — Telehealth: Payer: Self-pay | Admitting: Cardiology

## 2020-03-13 ENCOUNTER — Encounter: Payer: Self-pay | Admitting: *Deleted

## 2020-03-13 ENCOUNTER — Other Ambulatory Visit: Payer: Self-pay

## 2020-03-13 VITALS — BP 118/68 | HR 52 | Ht 66.0 in | Wt 237.0 lb

## 2020-03-13 DIAGNOSIS — I10 Essential (primary) hypertension: Secondary | ICD-10-CM

## 2020-03-13 DIAGNOSIS — Z789 Other specified health status: Secondary | ICD-10-CM

## 2020-03-13 DIAGNOSIS — I25119 Atherosclerotic heart disease of native coronary artery with unspecified angina pectoris: Secondary | ICD-10-CM | POA: Diagnosis not present

## 2020-03-13 DIAGNOSIS — R079 Chest pain, unspecified: Secondary | ICD-10-CM | POA: Diagnosis not present

## 2020-03-13 MED ORDER — PANTOPRAZOLE SODIUM 40 MG PO TBEC
40.0000 mg | DELAYED_RELEASE_TABLET | Freq: Every day | ORAL | 1 refills | Status: DC
Start: 2020-03-13 — End: 2020-09-04

## 2020-03-13 MED ORDER — ISOSORBIDE MONONITRATE ER 30 MG PO TB24
15.0000 mg | ORAL_TABLET | Freq: Every day | ORAL | 1 refills | Status: DC
Start: 2020-03-13 — End: 2020-09-04

## 2020-03-13 NOTE — Telephone Encounter (Signed)
Pre-cert Verification for the following procedure    LEXISCAN   DATE: 03/19/2020  LOCATION: [facility]  

## 2020-03-13 NOTE — Patient Instructions (Addendum)
Your physician recommends that you schedule a follow-up appointment in: PENDING WITH DR Plymouth  Your physician has recommended you make the following change in your medication:   STOP PEPCID   START PROTONIX 40 MG DAILY   START IMDUR 15 MG DAILY   Your physician has requested that you have a lexiscan myoview. For further information please visit [email or web address]. Please follow instruction sheet, as given.  Thank you for choosing [facility]!!

## 2020-03-13 NOTE — Progress Notes (Signed)
Cardiology Office Note  Date: 03/13/2020   ID: Renee Dudley, DOB 09/30/1935, MRN [id number]  PCP:  Manon Hilding, MD  Cardiologist:  [name], MD Electrophysiologist:  None   Chief Complaint  Patient presents with  . ER follow-up    History of Present Illness: Renee Dudley is an 84 y.o. female last seen in April.  She presents for a follow-up visit. Record review finds recent ER visit on June 26 with chest discomfort.  ECG showed no acute ST segment changes, chest x-ray without acute findings.  High-sensitivity troponin I levels were normal x3.  She tells me that she experienced a feeling of chest tightness, subsequent blood pressure elevation at "187/79" and visit in the ER as noted above.  No precipitant for the symptoms, although she does mention spontaneously that she is having regular reflux despite use of Pepcid, dysphagia as well.  She thought it might be related to this.  She has had milder symptoms since ER assessment.  Last ischemic testing was in 2015.  Past Medical History:  Diagnosis Date  . Anxiety   . Chronic headaches   . Coronary atherosclerosis of native coronary artery    Nonobstructive at cardiac catheterization 2007   . Depression   . DJD (degenerative joint disease)   . Essential hypertension   . GERD (gastroesophageal reflux disease)   . History of gout   . Mixed hyperlipidemia   . Peripheral edema   . Primary localized osteoarthritis of left knee   . Varicose veins     Past Surgical History:  Procedure Laterality Date  . APPENDECTOMY    . Cataract surgery Bilateral   . CHOLECYSTECTOMY  1986  . COLONOSCOPY    . LUMBAR LAMINECTOMY/DECOMPRESSION MICRODISCECTOMY Right 02/23/2017   Procedure: Right Lumbar Two-Three Microdiscectomy;  Surgeon: Kary Kos, MD;  Location: [place];  Service: Neurosurgery;  Laterality: Right;  . Right total knee arthroplasty  2011  . TOTAL ABDOMINAL HYSTERECTOMY W/ BILATERAL SALPINGOOPHORECTOMY  1988  .  TOTAL KNEE ARTHROPLASTY Left 12/23/2014   Procedure: LEFT TOTAL KNEE ARTHROPLASTY;  Surgeon: Elsie Saas, MD;  Location: [place];  Service: Orthopedics;  Laterality: Left;    Current Outpatient Medications  Medication Sig Dispense Refill  . allopurinol (ZYLOPRIM) 100 MG tablet Take 100 mg by mouth daily.    [name] ALPRAZolam (XANAX) 0.25 MG tablet Take 0.5 mg by mouth 2 (two) times daily.     [name] aspirin EC 81 MG tablet Take 1 tablet (81 mg total) by mouth daily. 90 tablet 3  . atenolol (TENORMIN) 50 MG tablet Take 50 mg by mouth daily.    . cholecalciferol (VITAMIN D) 1000 units tablet Take 2,000 Units by mouth daily.    [name] docusate sodium (COLACE) 100 MG capsule Take 400 mg by mouth at bedtime.    . febuxostat (ULORIC) 40 MG tablet Take 40 mg by mouth daily.    . furosemide (LASIX) 40 MG tablet Take 40 mg by mouth daily.      [name] MITIGARE 0.6 MG CAPS Take 1 capsule by mouth every 2 (two) hours as needed.    . pramipexole (MIRAPEX) 0.25 MG tablet Take 0.25 mg by mouth at bedtime.     . Probiotic Product (ALIGN) 4 MG CAPS Take 4 mg by mouth daily.    . sertraline (ZOLOFT) 50 MG tablet Take 50 mg by mouth daily.    . traMADol (ULTRAM) 50 MG tablet Take 50 mg by mouth every 6 (six)  hours as needed.    . isosorbide mononitrate (IMDUR) 30 MG 24 hr tablet Take 0.5 tablets (15 mg total) by mouth daily. 45 tablet 1  . pantoprazole (PROTONIX) 40 MG tablet Take 1 tablet (40 mg total) by mouth daily. 90 tablet 1   No current facility-administered medications for this visit.   Allergies:  Penicillins, Levofloxacin, Zocor [simvastatin], and Sulfa antibiotics   ROS:   No palpitations or syncope.  Physical Exam: VS:  BP 118/68   Pulse (!) 52   Ht 5\' 6"  (1.676 m)   Wt 237 lb (107.5 kg)   SpO2 98%   BMI 38.25 kg/m , BMI Body mass index is 38.25 kg/m.  Wt Readings from Last 3 Encounters:  03/13/20 237 lb (107.5 kg)  03/08/20 234 lb (106.1 kg)  12/31/19 231 lb 3.2 oz (104.9 kg)    General:  Elderly  woman, appears comfortable at rest. HEENT: Conjunctiva and lids normal, wearing a mask. Neck: Supple, no elevated JVP or carotid bruits, no thyromegaly. Lungs: Clear to auscultation, nonlabored breathing at rest. Cardiac: Regular rate and rhythm, no S3 or significant systolic murmur. Extremities: No pitting edema, distal pulses 2+.  ECG:  An ECG dated 03/08/2020 was personally reviewed today and demonstrated:  Sinus bradycardia.  Recent Labwork: 03/08/2020: BUN 25; Creatinine, Ser 1.05; Hemoglobin 13.5; Platelets 200; Potassium 3.8; Sodium 138  January 2021: Cholesterol 206, triglycerides 65, HDL 83, LDL 111  Other Studies Reviewed Today:  Lexiscan Cardiolite February 2015: No diagnostic ST segment changes, breast attenuation without clear evidence of scar or ischemia, LVEF 77%.  Chest x-ray 03/08/2020: FINDINGS: The heart size and mediastinal contours are within normal limits. Areas of atelectasis and scarring are again noted bilaterally. These are essentially stable from prior study. The visualized skeletal structures are unremarkable.  IMPRESSION: No active cardiopulmonary disease.  Assessment and Plan:  1.  Chest discomfort as outlined above, records from recent ER visit reviewed.  ECG does not show any significant ST segment changes, high-sensitivity troponin I levels were normal, chest x-ray without acute findings.  She does have regular reflux with dysphagia reported, this could be a contributor.  Prior history is of nonobstructive CAD, her last ischemic testing was in 2015.  Plan at this time is to stop Pepcid and replace it with Protonix, start Imdur 15 mg once daily, and also follow-up with a Lexiscan Myoview.  2.  Asymptomatic, mild carotid atherosclerosis.  She is on aspirin, history of statin intolerance.  Medication Adjustments/Labs and Tests Ordered: Current medicines are reviewed at length with the patient today.  Concerns regarding medicines are outlined above.    Tests Ordered: Orders Placed This Encounter  Procedures  . NM Myocar Multi W/Spect W/Wall Motion / EF    Medication Changes: Meds ordered this encounter  Medications  . isosorbide mononitrate (IMDUR) 30 MG 24 hr tablet    Sig: Take 0.5 tablets (15 mg total) by mouth daily.    Dispense:  45 tablet    Refill:  1    NEW MEDICATION 03/13/2020  . pantoprazole (PROTONIX) 40 MG tablet    Sig: Take 1 tablet (40 mg total) by mouth daily.    Dispense:  90 tablet    Refill:  1    NEW MEDICATION 03/13/2020    Disposition:  Follow up test results and determine next step.  Signed, Satira Sark, MD, [facility] 03/13/2020 8:55 AM    [facility] [place], [place], [place] [address]  Phone: [phone]; Fax: [phone]

## 2020-03-19 ENCOUNTER — Encounter (HOSPITAL_COMMUNITY): Payer: Self-pay

## 2020-03-19 ENCOUNTER — Ambulatory Visit (HOSPITAL_BASED_OUTPATIENT_CLINIC_OR_DEPARTMENT_OTHER)
Admission: RE | Admit: 2020-03-19 | Discharge: 2020-03-19 | Disposition: A | Discharge status: Home / Self Care | Payer: Medicare HMO | Source: Ambulatory Visit | Attending: Cardiology | Admitting: Cardiology

## 2020-03-19 ENCOUNTER — Ambulatory Visit (HOSPITAL_COMMUNITY)
Admission: RE | Admit: 2020-03-19 | Discharge: 2020-03-19 | Disposition: A | Discharge status: Home / Self Care | Payer: Medicare HMO | Source: Ambulatory Visit | Attending: Cardiology | Admitting: Cardiology

## 2020-03-19 ENCOUNTER — Other Ambulatory Visit: Payer: Self-pay

## 2020-03-19 DIAGNOSIS — R079 Chest pain, unspecified: Secondary | ICD-10-CM | POA: Insufficient documentation

## 2020-03-19 HISTORY — DX: Disorder of kidney and ureter, unspecified: N28.9

## 2020-03-19 LAB — NM MYOCAR MULTI W/SPECT W/WALL MOTION / EF
LV dias vol: 90 mL (ref 46–106)
LV sys vol: 25 mL
Peak HR: 68 {beats}/min
RATE: 0.15
Rest HR: 51 {beats}/min
SDS: 1
SRS: 1
SSS: 2
TID: 1.26

## 2020-03-19 MED ORDER — REGADENOSON 0.4 MG/5ML IV SOLN
INTRAVENOUS | Status: AC
  Administered 2020-03-19: 0.4 mg via INTRAVENOUS
  Filled 2020-03-19: qty 5

## 2020-03-19 MED ORDER — TECHNETIUM TC 99M TETROFOSMIN IV KIT
30.0000 | PACK | Freq: Once | INTRAVENOUS | Status: AC | PRN
  Administered 2020-03-19: 30 via INTRAVENOUS

## 2020-03-19 MED ORDER — SODIUM CHLORIDE FLUSH 0.9 % IV SOLN
INTRAVENOUS | Status: AC
  Administered 2020-03-19: 10 mL via INTRAVENOUS
  Filled 2020-03-19: qty 10

## 2020-03-19 MED ORDER — TECHNETIUM TC 99M TETROFOSMIN IV KIT
10.0000 | PACK | Freq: Once | INTRAVENOUS | Status: AC | PRN
  Administered 2020-03-19: 10 via INTRAVENOUS

## 2020-03-20 ENCOUNTER — Telehealth: Payer: Self-pay | Admitting: *Deleted

## 2020-03-20 NOTE — Telephone Encounter (Signed)
-----   Message from Satira Sark, MD sent at 03/19/2020  3:10 PM EDT ----- Results reviewed.  Stress test was low risk, soft tissue attenuation noted but no definite ischemia.  If her symptoms have improved, continue current medications and schedule a 74-month office visit.

## 2020-03-20 NOTE — Telephone Encounter (Signed)
Patient informed. Copy sent to PCP °

## 2020-05-21 ENCOUNTER — Telehealth: Payer: Self-pay | Admitting: *Deleted

## 2020-05-21 NOTE — Progress Notes (Signed)
Cardiology Office Note  Date: 05/22/2020   ID: Renee Dudley, DOB 12/21/1935, MRN [id number]  PCP:  Manon Hilding, MD  Cardiologist:  [name], MD Electrophysiologist:  None   Chief Complaint: Follow-up CAD/chest pain  History of Present Illness: Renee Dudley is a 84 y.o. female with a history of CAD, HTN, statin intolerance, chest pain,, HTN, HLD, peripheral edema, gout, GERD, DJD, varicose veins.  Last encounter with Dr. Domenic Polite on 03/13/2020.  She had a recent ER visit on June 26 for chest discomfort.  EKG showed no acute ST changes.  Chest x-ray negative, troponins were negative x3.  She had a experience of feeling of chest tightness with subsequent blood pressure elevated.  She was having episodes of reflux despite use of Pepcid and some dysphagia.  Plan was to stop Pepcid start Protonix 40 mg daily.  Start Imdur 15 mg daily as well as schedule a [employer].  Her mild carotid atherosclerosis was asymptomatic.  She was continuing aspirin.  She had a history of statin intolerance.  Patient states she has had no further episodes of chest pain.  She did have one episode of left precordial pain under her left breast without any associated nausea, vomiting, or diaphoresis.  Not associated with exertional activity.  She states she thinks it may have been related to acid reflux.  States she took a antacid pill and it went away.  I reviewed the results of the stress test with her today.  She verbalizes understanding.  She reiterates she believes this is due to acid reflux.  She is on pantoprazole for GERD.  She denies any orthostatic symptoms such as lightheadedness, dizziness, presyncope or syncopal episodes, palpitations or arrhythmias, CVA or TIA-like symptoms, PND, orthopnea, bleeding in stool or urine.  No claudication-like symptoms, DVT or PE-like symptoms, or lower extremity edema.     Past Medical History:  Diagnosis Date  . Anxiety   . Chronic headaches   .  Coronary atherosclerosis of native coronary artery    Nonobstructive at cardiac catheterization 2007   . Depression   . DJD (degenerative joint disease)   . Essential hypertension   . GERD (gastroesophageal reflux disease)   . History of gout   . Mixed hyperlipidemia   . Peripheral edema   . Primary localized osteoarthritis of left knee   . Renal insufficiency   . Varicose veins     Past Surgical History:  Procedure Laterality Date  . APPENDECTOMY    . Cataract surgery Bilateral   . CHOLECYSTECTOMY  1986  . COLONOSCOPY    . LUMBAR LAMINECTOMY/DECOMPRESSION MICRODISCECTOMY Right 02/23/2017   Procedure: Right Lumbar Two-Three Microdiscectomy;  Surgeon: Kary Kos, MD;  Location: [facility];  Service: Neurosurgery;  Laterality: Right;  . Right total knee arthroplasty  2011  . TOTAL ABDOMINAL HYSTERECTOMY W/ BILATERAL SALPINGOOPHORECTOMY  1988  . TOTAL KNEE ARTHROPLASTY Left 12/23/2014   Procedure: LEFT TOTAL KNEE ARTHROPLASTY;  Surgeon: Elsie Saas, MD;  Location: [facility];  Service: Orthopedics;  Laterality: Left;    Current Outpatient Medications  Medication Sig Dispense Refill  . allopurinol (ZYLOPRIM) 100 MG tablet Take 100 mg by mouth daily.    [name] ALPRAZolam (XANAX) 0.25 MG tablet Take 0.5 mg by mouth 2 (two) times daily.     [name] aspirin EC 81 MG tablet Take 1 tablet (81 mg total) by mouth daily. 90 tablet 3  . atenolol (TENORMIN) 50 MG tablet Take 50 mg by mouth daily.    [name]  cholecalciferol (VITAMIN D) 1000 units tablet Take 2,000 Units by mouth daily.    [name] docusate sodium (COLACE) 100 MG capsule Take 400 mg by mouth at bedtime.    . febuxostat (ULORIC) 40 MG tablet Take 40 mg by mouth daily.    . furosemide (LASIX) 40 MG tablet Take 40 mg by mouth daily.      . isosorbide mononitrate (IMDUR) 30 MG 24 hr tablet Take 0.5 tablets (15 mg total) by mouth daily. 45 tablet 1  . MITIGARE 0.6 MG CAPS Take 1 capsule by mouth every 2 (two) hours as needed.    . pantoprazole (PROTONIX) 40 MG  tablet Take 1 tablet (40 mg total) by mouth daily. 90 tablet 1  . pramipexole (MIRAPEX) 0.25 MG tablet Take 0.25 mg by mouth at bedtime.     . Probiotic Product (ALIGN) 4 MG CAPS Take 4 mg by mouth daily.    . sertraline (ZOLOFT) 50 MG tablet Take 50 mg by mouth daily.    . traMADol (ULTRAM) 50 MG tablet Take 50 mg by mouth every 6 (six) hours as needed.     No current facility-administered medications for this visit.   Allergies:  Penicillins, Levofloxacin, Zocor [simvastatin], and Sulfa antibiotics   Social History: The patient  reports that she has never smoked. She has never used smokeless tobacco. She reports that she does not drink alcohol and does not use drugs.   Family History: The patient's family history includes Heart failure in her mother; Stroke in her father, mother, and sister.   ROS:  Please see the history of present illness. Otherwise, complete review of systems is positive for none.  All other systems are reviewed and negative.   Physical Exam: VS:  BP (!) 110/58   Pulse (!) 52   Ht 5\' 6"  (1.676 m)   Wt 238 lb (108 kg)   SpO2 98%   BMI 38.41 kg/m , BMI Body mass index is 38.41 kg/m.  Wt Readings from Last 3 Encounters:  05/22/20 238 lb (108 kg)  03/13/20 237 lb (107.5 kg)  03/08/20 234 lb (106.1 kg)    General: Patient appears comfortable at rest. Neck: Supple, no elevated JVP or carotid bruits, no thyromegaly. Lungs: Clear to auscultation, nonlabored breathing at rest. Cardiac: Regular rate and rhythm, no S3 or significant systolic murmur, no pericardial rub. Extremities: No pitting edema, distal pulses 2+. Skin: Warm and dry. Musculoskeletal: No kyphosis. Neuropsychiatric: Alert and oriented x3, affect grossly appropriate.  ECG:  An ECG dated 05/22/2020 was personally reviewed today and demonstrated:  Sinus bradycardia with rate of 59,  Recent Labwork: 03/08/2020: BUN 25; Creatinine, Ser 1.05; Hemoglobin 13.5; Platelets 200; Potassium 3.8; Sodium 138  No  results found for: CHOL, TRIG, HDL, CHOLHDL, VLDL, LDLCALC, LDLDIRECT  Other Studies Reviewed Today:  Nuclear stress test 03/19/2020  Narrative & Impression   No diagnostic ST segment changes to indicate ischemia.  Small, mild intensity, apical lateral and basal septal defects that are fixed, more prominent on rest imaging in the septal distribution, and consistent with soft tissue attenuation. No large ischemic territories noted.  This is a low risk study.  Nuclear stress EF: 72%.      Lexiscan Cardiolite February 2015: No diagnostic ST segment changes, breast attenuation without clear evidence of scar or ischemia, LVEF 77%.  Chest x-ray 03/08/2020: FINDINGS: The heart size and mediastinal contours are within normal limits. Areas of atelectasis and scarring are again noted bilaterally. These are essentially stable from prior  study. The visualized skeletal structures are unremarkable.  IMPRESSION: No active cardiopulmonary disease.  Assessment and Plan:  1. Chest discomfort   2. Bilateral carotid artery stenosis    1. Chest discomfort Denies any subsequent chest discomfort except for one episode last week she states she had a spell where she had some left precordial pain under her left breast which she attributed to acid reflux.  She states she took an antacid pill and had relief approximately 30 to 40 minutes later.  She takes pantoprazole for acid reflux.  I reviewed the results of stress test with her and the fact that it did not show any major areas of ischemia.  Continue aspirin 81 mg daily.  Imdur 30 mg daily.  I am giving her a bottle of nitroglycerin sublingual to use as needed if she has breakthrough anginal symptoms in spite of being on Imdur.  2. Bilateral carotid artery stenosis Recent carotid artery duplex study on 10/25/2018 showed bilateral ICA stenosis of 1 to 39%.  Bilateral vertebral arteries demonstrated antegrade flow.  Normal flow dynamics and bilateral  subclavian arteries.   Medication Adjustments/Labs and Tests Ordered: Current medicines are reviewed at length with the patient today.  Concerns regarding medicines are outlined above.   Disposition: Follow-up with Dr. Domenic Polite or APP 6 months  Signed, Levell July, NP 05/22/2020 10:54 AM    [facility] [place], [place], [place] [address] Phone: [phone]; Fax: [phone]

## 2020-05-21 NOTE — Telephone Encounter (Signed)
Reports intermittent, dull chest tightness with SOB rated 6/10 that started on Monday night (05/19/2020). Denies cough, congestion, n/v, flutter, palpitations. Has not checked BP or HR. Denies active chest pain. Says she took [employer] flavored chew antacid Monday night and her symptoms improved. Says she took one on yesterday also and symptoms improved. Advised that she needed to contact her PCP for an appointment. Says she called and was told that they had no availability so she contacted our office to have her "heart checked out". Advised that first available appointment will be given with Katina Dung NP tomorrow at 10:30 am. Advised if symptoms get worse, to go to the ED for an evaluation. Verbalized understanding of plan.

## 2020-05-22 ENCOUNTER — Encounter: Payer: Self-pay | Admitting: Family Medicine

## 2020-05-22 ENCOUNTER — Other Ambulatory Visit: Payer: Self-pay

## 2020-05-22 ENCOUNTER — Ambulatory Visit: Payer: Medicare HMO | Admitting: Family Medicine

## 2020-05-22 VITALS — BP 110/58 | HR 52 | Ht 66.0 in | Wt 238.0 lb

## 2020-05-22 DIAGNOSIS — I6523 Occlusion and stenosis of bilateral carotid arteries: Secondary | ICD-10-CM | POA: Diagnosis not present

## 2020-05-22 DIAGNOSIS — R0789 Other chest pain: Secondary | ICD-10-CM | POA: Diagnosis not present

## 2020-05-22 MED ORDER — NITROGLYCERIN 0.4 MG SL SUBL
0.4000 mg | SUBLINGUAL_TABLET | SUBLINGUAL | 3 refills | Status: DC | PRN
Start: 2020-05-22 — End: 2023-03-08

## 2020-05-22 NOTE — Patient Instructions (Addendum)

## 2020-06-23 ENCOUNTER — Ambulatory Visit: Payer: Medicare HMO | Admitting: Cardiology

## 2020-09-04 ENCOUNTER — Other Ambulatory Visit: Payer: Self-pay | Admitting: Cardiology

## 2020-11-26 ENCOUNTER — Ambulatory Visit (INDEPENDENT_AMBULATORY_CARE_PROVIDER_SITE_OTHER): Payer: Medicare HMO | Admitting: Cardiology

## 2020-11-26 ENCOUNTER — Encounter: Payer: Self-pay | Admitting: Cardiology

## 2020-11-26 VITALS — BP 152/64 | HR 54 | Ht 66.0 in | Wt 242.0 lb

## 2020-11-26 DIAGNOSIS — I6523 Occlusion and stenosis of bilateral carotid arteries: Secondary | ICD-10-CM

## 2020-11-26 DIAGNOSIS — I25119 Atherosclerotic heart disease of native coronary artery with unspecified angina pectoris: Secondary | ICD-10-CM

## 2020-11-26 NOTE — Progress Notes (Signed)
Cardiology Office Note  Date: 11/26/2020   ID: CORINA STACY, DOB 1936/05/20, MRN [id number]  PCP:  Manon Hilding, MD  Cardiologist:  [name], MD Electrophysiologist:  None   Chief Complaint  Patient presents with  . Cardiac follow-up    History of Present Illness: Renee Dudley is an 85 y.o. female last seen in September 2021 by Mr. Leonides Sake NP.  She is here for a routine visit.  States that she has been doing well overall, no recurrent chest pain or palpitations.  I reviewed her medications which are stable and outlined below.  Cardiac regimen includes aspirin, atenolol, Imdur, and Lasix.  She has a prior history of intolerance to Zocor.  Plan to get her interval lab work from Dr. Quintin Alto.  Ischemic testing from July 2021 was overall low risk as noted below.  Past Medical History:  Diagnosis Date  . Anxiety   . Chronic headaches   . Coronary atherosclerosis of native coronary artery    Nonobstructive at cardiac catheterization 2007   . Depression   . DJD (degenerative joint disease)   . Essential hypertension   . GERD (gastroesophageal reflux disease)   . History of gout   . Mixed hyperlipidemia   . Peripheral edema   . Primary localized osteoarthritis of left knee   . Renal insufficiency   . Varicose veins     Past Surgical History:  Procedure Laterality Date  . APPENDECTOMY    . Cataract surgery Bilateral   . CHOLECYSTECTOMY  1986  . COLONOSCOPY    . LUMBAR LAMINECTOMY/DECOMPRESSION MICRODISCECTOMY Right 02/23/2017   Procedure: Right Lumbar Two-Three Microdiscectomy;  Surgeon: Kary Kos, MD;  Location: [facility];  Service: Neurosurgery;  Laterality: Right;  . Right total knee arthroplasty  2011  . TOTAL ABDOMINAL HYSTERECTOMY W/ BILATERAL SALPINGOOPHORECTOMY  1988  . TOTAL KNEE ARTHROPLASTY Left 12/23/2014   Procedure: LEFT TOTAL KNEE ARTHROPLASTY;  Surgeon: Elsie Saas, MD;  Location: [facility];  Service: Orthopedics;  Laterality: Left;    Current  Outpatient Medications  Medication Sig Dispense Refill  . allopurinol (ZYLOPRIM) 100 MG tablet Take 100 mg by mouth daily.    [name] ALPRAZolam (XANAX) 0.25 MG tablet Take 0.5 mg by mouth 2 (two) times daily.    [name] aspirin EC 81 MG tablet Take 1 tablet (81 mg total) by mouth daily. 90 tablet 3  . atenolol (TENORMIN) 50 MG tablet Take 50 mg by mouth daily.    . cholecalciferol (VITAMIN D) 1000 units tablet Take 2,000 Units by mouth daily.    [name] docusate sodium (COLACE) 100 MG capsule Take 400 mg by mouth at bedtime.    . febuxostat (ULORIC) 40 MG tablet Take 40 mg by mouth daily.    . furosemide (LASIX) 40 MG tablet Take 40 mg by mouth daily.      . isosorbide mononitrate (IMDUR) 30 MG 24 hr tablet TAKE 0.5 TABLETS (15 MG TOTAL) BY MOUTH DAILY. 45 tablet 3  . MITIGARE 0.6 MG CAPS Take 1 capsule by mouth every 2 (two) hours as needed.    . nitroGLYCERIN (NITROSTAT) 0.4 MG SL tablet Place 1 tablet (0.4 mg total) under the tongue every 5 (five) minutes as needed for chest pain. 25 tablet 3  . pantoprazole (PROTONIX) 40 MG tablet TAKE 1 TABLET BY MOUTH EVERY DAY 90 tablet 3  . pramipexole (MIRAPEX) 0.25 MG tablet Take 0.25 mg by mouth at bedtime.     . Probiotic Product (ALIGN) 4 MG  CAPS Take 4 mg by mouth daily.    . sertraline (ZOLOFT) 50 MG tablet Take 50 mg by mouth daily.    . traMADol (ULTRAM) 50 MG tablet Take 50 mg by mouth every 6 (six) hours as needed.     No current facility-administered medications for this visit.   Allergies:  Penicillins, Levofloxacin, Zocor [simvastatin], and Sulfa antibiotics   ROS: No syncope.  Physical Exam: VS:  BP (!) 152/64   Pulse (!) 54   Ht 5\' 6"  (1.676 m)   Wt 242 lb (109.8 kg)   SpO2 96%   BMI 39.06 kg/m , BMI Body mass index is 39.06 kg/m.  Wt Readings from Last 3 Encounters:  11/26/20 242 lb (109.8 kg)  05/22/20 238 lb (108 kg)  03/13/20 237 lb (107.5 kg)    General: Elderly woman, appears comfortable at rest. HEENT: Conjunctiva and lids  normal, wearing a mask. Neck: Supple, no elevated JVP or carotid bruits, no thyromegaly. Lungs: Clear to auscultation, nonlabored breathing at rest. Cardiac: Regular rate and rhythm, no S3 or significant systolic murmur, no pericardial rub. Extremities: No pitting edema.  ECG:  An ECG dated 05/22/2020 was personally reviewed today and demonstrated:  Sinus bradycardia with lead motion artifact.  Recent Labwork: 03/08/2020: BUN 25; Creatinine, Ser 1.05; Hemoglobin 13.5; Platelets 200; Potassium 3.8; Sodium 138  January 2021: Cholesterol 206, triglycerides 65, HDL 83, LDL 111  Other Studies Reviewed Today:  Lexiscan Myoview 03/19/2020:  No diagnostic ST segment changes to indicate ischemia.  Small, mild intensity, apical lateral and basal septal defects that are fixed, more prominent on rest imaging in the septal distribution, and consistent with soft tissue attenuation. No large ischemic territories noted.  This is a low risk study.  Nuclear stress EF: 72%.  Assessment and Plan:  1.  History of nonobstructive CAD, no recurring chest pain at this point on medical therapy.  She underwent a follow-up Myoview in July of last year that was overall low risk.  Continue observation on aspirin, atenolol, and Imdur.  She has as needed nitroglycerin available.  Prior history of intolerance to Zocor, requesting interval lab work from Dr. Quintin Alto.  We might consider a different statin preparation.  2.  Mild carotid artery atherosclerosis.  She is asymptomatic.  Medication Adjustments/Labs and Tests Ordered: Current medicines are reviewed at length with the patient today.  Concerns regarding medicines are outlined above.   Tests Ordered: No orders of the defined types were placed in this encounter.   Medication Changes: No orders of the defined types were placed in this encounter.   Disposition:  Follow up 6 months in the [place] office.  Signed, Satira Sark, MD, [facility] 11/26/2020 2:16 PM     [facility] [place], [place], [place] [address] Phone: [phone]; Fax: [phone]

## 2020-11-26 NOTE — Patient Instructions (Addendum)

## 2020-12-10 ENCOUNTER — Other Ambulatory Visit: Payer: Self-pay | Admitting: Physical Medicine and Rehabilitation

## 2020-12-10 ENCOUNTER — Other Ambulatory Visit (HOSPITAL_COMMUNITY): Payer: Self-pay | Admitting: Physical Medicine and Rehabilitation

## 2020-12-10 DIAGNOSIS — M545 Low back pain, unspecified: Secondary | ICD-10-CM

## 2020-12-30 ENCOUNTER — Ambulatory Visit: Admission: RE | Admit: 2020-12-30 | Payer: Medicare HMO | Source: Ambulatory Visit

## 2021-01-01 ENCOUNTER — Other Ambulatory Visit: Payer: Self-pay

## 2021-01-01 ENCOUNTER — Ambulatory Visit (HOSPITAL_COMMUNITY)
Admission: RE | Admit: 2021-01-01 | Discharge: 2021-01-01 | Disposition: A | Discharge status: Home / Self Care | Payer: Medicare HMO | Source: Ambulatory Visit | Attending: Physical Medicine and Rehabilitation | Admitting: Physical Medicine and Rehabilitation

## 2021-01-01 DIAGNOSIS — M545 Low back pain, unspecified: Secondary | ICD-10-CM | POA: Insufficient documentation

## 2021-03-30 ENCOUNTER — Other Ambulatory Visit: Payer: Self-pay | Admitting: Neurosurgery

## 2021-03-30 DIAGNOSIS — M4316 Spondylolisthesis, lumbar region: Secondary | ICD-10-CM

## 2021-04-24 ENCOUNTER — Ambulatory Visit
Admission: RE | Admit: 2021-04-24 | Discharge: 2021-04-24 | Disposition: A | Discharge status: Home / Self Care | Payer: Medicare HMO | Source: Ambulatory Visit | Attending: Neurosurgery | Admitting: Neurosurgery

## 2021-04-24 DIAGNOSIS — M4316 Spondylolisthesis, lumbar region: Secondary | ICD-10-CM

## 2021-06-01 ENCOUNTER — Ambulatory Visit: Payer: Medicare HMO | Admitting: Cardiology

## 2021-06-01 ENCOUNTER — Other Ambulatory Visit: Payer: Self-pay

## 2021-06-01 ENCOUNTER — Encounter: Payer: Self-pay | Admitting: Cardiology

## 2021-06-01 VITALS — BP 118/60 | HR 49 | Ht 66.0 in | Wt 245.0 lb

## 2021-06-01 DIAGNOSIS — I2510 Atherosclerotic heart disease of native coronary artery without angina pectoris: Secondary | ICD-10-CM

## 2021-06-01 DIAGNOSIS — I10 Essential (primary) hypertension: Secondary | ICD-10-CM | POA: Diagnosis not present

## 2021-06-01 MED ORDER — ATENOLOL 25 MG PO TABS: 25.0000 mg | ORAL_TABLET | Freq: Every day | ORAL | 6 refills | Status: DC

## 2021-06-01 NOTE — Patient Instructions (Signed)
Medication Instructions:  Decrease Atenolol to '25mg'$  daily.  Continue all other medications.     Labwork: none  Testing/Procedures: none  Follow-Up: 6 months   Any Other Special Instructions Will Be Listed Below (If Applicable).   If you need a refill on your cardiac medications before your next appointment, please call your pharmacy.

## 2021-06-01 NOTE — Progress Notes (Signed)
Cardiology Office Note  Date: 06/01/2021   ID: Renee Dudley, DOB [birth year], MRN [id number]  PCP:  Manon Hilding, MD  Cardiologist:  [name], MD Electrophysiologist:  None   Chief Complaint  Patient presents with   Cardiac follow-up    History of Present Illness: Renee Dudley is an 85 y.o. female last seen in March.  She is here for a routine visit.  Reports no angina symptoms or nitroglycerin use.  Main concern recently has been lower back pain and right leg pain.  She has been diagnosed with lumbar disc disease and is following with Dr. Saintclair Halsted at this time.  She anticipates that she may ultimately need surgery.  I reviewed her medications which are outlined below.  We discussed reducing atenolol to 25 mg daily given low resting heart rates.  The remainder of her cardiac regimen is stable.  I personally reviewed her ECG today which shows sinus bradycardia with poor R wave progression rule out old anterolateral infarct pattern.  Past Medical History:  Diagnosis Date   Anxiety    Chronic headaches    Coronary atherosclerosis of native coronary artery    Nonobstructive at cardiac catheterization 2007    Depression    DJD (degenerative joint disease)    Essential hypertension    GERD (gastroesophageal reflux disease)    History of gout    Mixed hyperlipidemia    Peripheral edema    Primary localized osteoarthritis of left knee    Renal insufficiency    Varicose veins     Past Surgical History:  Procedure Laterality Date   APPENDECTOMY     Cataract surgery Bilateral    CHOLECYSTECTOMY  1986   COLONOSCOPY     LUMBAR LAMINECTOMY/DECOMPRESSION MICRODISCECTOMY Right 02/23/2017   Procedure: Right Lumbar Two-Three Microdiscectomy;  Surgeon: Kary Kos, MD;  Location: [facility];  Service: Neurosurgery;  Laterality: Right;   Right total knee arthroplasty  2011   TOTAL ABDOMINAL HYSTERECTOMY W/ BILATERAL SALPINGOOPHORECTOMY  1988   TOTAL KNEE ARTHROPLASTY Left  12/23/2014   Procedure: LEFT TOTAL KNEE ARTHROPLASTY;  Surgeon: Elsie Saas, MD;  Location: [facility];  Service: Orthopedics;  Laterality: Left;    Current Outpatient Medications  Medication Sig Dispense Refill   allopurinol (ZYLOPRIM) 100 MG tablet Take 100 mg by mouth daily.     ALPRAZolam (XANAX) 0.25 MG tablet Take 0.5 mg by mouth 2 (two) times daily.     aspirin EC 81 MG tablet Take 1 tablet (81 mg total) by mouth daily. 90 tablet 3   cholecalciferol (VITAMIN D) 1000 units tablet Take 2,000 Units by mouth daily.     docusate sodium (COLACE) 100 MG capsule Take 400 mg by mouth at bedtime.     febuxostat (ULORIC) 40 MG tablet Take 40 mg by mouth daily.     furosemide (LASIX) 40 MG tablet Take 40 mg by mouth daily.       isosorbide mononitrate (IMDUR) 30 MG 24 hr tablet TAKE 0.5 TABLETS (15 MG TOTAL) BY MOUTH DAILY. 45 tablet 3   MITIGARE 0.6 MG CAPS Take 1 capsule by mouth every 2 (two) hours as needed.     nitroGLYCERIN (NITROSTAT) 0.4 MG SL tablet Place 1 tablet (0.4 mg total) under the tongue every 5 (five) minutes as needed for chest pain. 25 tablet 3   pantoprazole (PROTONIX) 40 MG tablet TAKE 1 TABLET BY MOUTH EVERY DAY 90 tablet 3   pramipexole (MIRAPEX) 0.25 MG tablet Take 0.25 mg by  mouth at bedtime.      Probiotic Product (ALIGN) 4 MG CAPS Take 4 mg by mouth daily.     sertraline (ZOLOFT) 50 MG tablet Take 50 mg by mouth daily.     traMADol (ULTRAM) 50 MG tablet Take 50 mg by mouth every 6 (six) hours as needed.     atenolol (TENORMIN) 25 MG tablet Take 1 tablet (25 mg total) by mouth daily. 30 tablet 6   No current facility-administered medications for this visit.   Allergies:  Penicillins, Levofloxacin, Zocor [simvastatin], and Sulfa antibiotics   ROS: No syncope.  Physical Exam: VS:  BP 118/60 (BP Location: Left Arm, Patient Position: Sitting, Cuff Size: Normal)   Pulse (!) 49   Ht 5\' 6"  (1.676 m)   Wt 245 lb (111.1 kg)   SpO2 98%   BMI 39.54 kg/m , BMI Body mass  index is 39.54 kg/m.  Wt Readings from Last 3 Encounters:  06/01/21 245 lb (111.1 kg)  11/26/20 242 lb (109.8 kg)  05/22/20 238 lb (108 kg)    General: Elderly woman, appears comfortable at rest. HEENT: Conjunctiva and lids normal, wearing a mask. Neck: Supple, no elevated JVP or carotid bruits, no thyromegaly. Lungs: Clear to auscultation, nonlabored breathing at rest. Cardiac: Regular rate and rhythm, no S3 or significant systolic murmur, no pericardial rub. Extremities: Venous stasis, no pitting edema.  ECG:  An ECG dated 05/22/2020 was personally reviewed today and demonstrated:  Sinus bradycardia with lead motion artifact.  Recent Labwork:  03/08/2020: BUN 25; Creatinine, Ser 1.05; Hemoglobin 13.5; Platelets 200; Potassium 3.8; Sodium 138  January 2021: Cholesterol 206, triglycerides 65, HDL 83, LDL 111  Other Studies Reviewed Today:  Lexiscan Myoview 03/19/2020: No diagnostic ST segment changes to indicate ischemia. Small, mild intensity, apical lateral and basal septal defects that are fixed, more prominent on rest imaging in the septal distribution, and consistent with soft tissue attenuation. No large ischemic territories noted. This is a low risk study. Nuclear stress EF: 72%.  Assessment and Plan:  1.  History of nonobstructive CAD with plan for medical therapy in the absence of angina symptoms.  She did have a follow-up Myoview last year which was low risk.  Given low resting heart rate we will reduce atenolol to 25 mg daily.  Otherwise continue aspirin, Imdur, and as needed nitroglycerin.  She has a history of statin intolerance.  2.  Essential hypertension, blood pressure is well controlled today.  Medication Adjustments/Labs and Tests Ordered: Current medicines are reviewed at length with the patient today.  Concerns regarding medicines are outlined above.   Tests Ordered: Orders Placed This Encounter  Procedures   EKG 12-Lead     Medication Changes: Meds  ordered this encounter  Medications   atenolol (TENORMIN) 25 MG tablet    Sig: Take 1 tablet (25 mg total) by mouth daily.    Dispense:  30 tablet    Refill:  6    Dose decreased 06/01/2021     Disposition:  Follow up  6 months.  Signed, Satira Sark, MD, [facility] 06/01/2021 2:31 PM    [facility] [place], [place], [place] [address] Phone: [phone]; Fax: [phone]

## 2021-06-16 ENCOUNTER — Other Ambulatory Visit: Payer: Self-pay | Admitting: Neurosurgery

## 2021-06-17 ENCOUNTER — Telehealth: Payer: Self-pay | Admitting: *Deleted

## 2021-06-17 NOTE — Telephone Encounter (Signed)
   [facility] Pre-operative Risk Assessment    Patient Name: Renee Dudley  DOB: July 20, 1936 MRN: [id number]  HEARTCARE STAFF:  - IMPORTANT!!!!!! Under Visit Info/Reason for Call, type in Other and utilize the format Clearance MM/DD/YY or Clearance TBD. Do not use dashes or single digits. - Please review there is not already an duplicate clearance open for this procedure. - If request is for dental extraction, please clarify the # of teeth to be extracted. - If the patient is currently at the dentist's office, call Pre-Op Callback Staff (MA/nurse) to input urgent request.  - If the patient is not currently in the dentist office, please route to the Pre-Op pool.  Request for surgical clearance:  What type of surgery is being performed? L2-3 Anterior Lateral Lumbar Fusion  When is this surgery scheduled? 07/01/2021  What type of clearance is required (medical clearance vs. Pharmacy clearance to hold med vs. Both)? Both  Are there any medications that need to be held prior to surgery and how long? Aspirin (please give duration)  Practice name and name of physician performing surgery? [facility]-Dr. Kary Kos  What is the office phone number? [phone]   7.   What is the office fax number? [phone] (Attention: Lorriane Shire)  8.   Anesthesia type (None, local, MAC, general) ? General   Marlou Sa 06/17/2021, 7:43 AM  _________________________________________________________________   (provider comments below)

## 2021-06-17 NOTE — Telephone Encounter (Signed)
   Name: Renee Dudley  DOB: Oct 04, 1935  MRN: [id number]   Primary Cardiologist: [name], MD  Chart reviewed as part of pre-operative protocol coverage. Patient was contacted 06/17/2021 in reference to pre-operative risk assessment for pending surgery as outlined below.  Renee Dudley was last seen on 06/01/21 by Dr. Domenic Polite with history of nonobstructive CAD, felt to be doing well. Beta blocker dose was reduced for HR 49bpm. At recent OV, Dr. Domenic Polite acknowledged need for upcoming surgery. No additional testing was ordered at that visit. She had had low risk stress test in 03/2020.  I reached out to patient for update on how she is doing. The patient affirms she has been doing well without any new cardiac symptoms. She is mainly just struggling with back pain and is eager to get her surgery done with. Therefore, based on ACC/AHA guidelines, the patient would be at acceptable risk for the planned procedure without further cardiovascular testing. The patient was advised that if she develops new symptoms prior to surgery to contact our office to arrange for a follow-up visit, and she verbalized understanding.  Regarding aspirin, based on cardiac history, there is no contraindication to holding ASA if needed for procedure. We generally clear to hold 7 days if needed - will defer ultimate duration to surgeon, but please contact us back if a longer hold is required.  I will route this recommendation to the requesting party via Epic fax function and remove from pre-op pool. Please call with questions.  Charlie Pitter, PA-C 06/17/2021, 12:50 PM

## 2021-06-26 NOTE — Progress Notes (Signed)
Surgical Instructions    Your procedure is scheduled on 07/01/21.  Report to [facility] Main Entrance "A" at 6:30 A.M., then check in with the Admitting office.  Call this number if you have problems the morning of surgery:  [phone]   If you have any questions prior to your surgery date call [phone]: Open Monday-Friday 8am-4pm    Remember:  Do not eat or drink after midnight the night before your surgery      Take these medicines the morning of surgery with A SIP OF WATER  atenolol (TENORMIN) famotidine (PEPCID)  isosorbide mononitrate (IMDUR) pantoprazole (PROTONIX) sertraline (ZOLOFT)  IF NEEDED: allopurinol (ZYLOPRIM) MITIGARE   As of today, STOP taking any Aspirin (unless otherwise instructed by your surgeon) Aleve, Naproxen, Ibuprofen, Motrin, Advil, Goody's, BC's, all herbal medications, fish oil, diclofenac (VOLTAREN) and all vitamins.     After your COVID test   You are not required to quarantine however you are required to wear a well-fitting mask when you are out and around people not in your household.  If your mask becomes wet or soiled, replace with a new one.  Wash your hands often with soap and water for 20 seconds or clean your hands with an alcohol-based hand sanitizer that contains at least 60% alcohol.  Do not share personal items.  Notify your provider: if you are in close contact with someone who has COVID  or if you develop a fever of 100.4 or greater, sneezing, cough, sore throat, shortness of breath or body aches.             Do not wear jewelry or makeup Do not wear lotions, powders, perfumes/colognes, or deodorant. Do not shave 48 hours prior to surgery.   Do not bring valuables to the hospital. DO Not wear nail polish, gel polish, artificial nails, or any other type of covering on natural nails including finger and toenails. If patients have artificial nails, gel coating, etc. that need to be removed by a nail salon, please have  this removed prior to surgery or surgery may need to be canceled/delayed if the surgeon/ anesthesia feels like the patient is unable to be adequately monitored.             [facility] is not responsible for any belongings or valuables.  Do NOT Smoke (Tobacco/Vaping)  24 hours prior to your procedure  If you use a CPAP at night, you may bring your mask for your overnight stay.   Contacts, glasses, hearing aids, dentures or partials may not be worn into surgery, please bring cases for these belongings   For patients admitted to the hospital, discharge time will be determined by your treatment team.   Patients discharged the day of surgery will not be allowed to drive home, and someone needs to stay with them for 24 hours.  NO VISITORS WILL BE ALLOWED IN PRE-OP WHERE PATIENTS ARE PREPPED FOR SURGERY.  ONLY 1 SUPPORT PERSON MAY BE PRESENT IN THE WAITING ROOM WHILE YOU ARE IN SURGERY.  IF YOU ARE TO BE ADMITTED, ONCE YOU ARE IN YOUR ROOM YOU WILL BE ALLOWED TWO (2) VISITORS. 1 (ONE) VISITOR MAY STAY OVERNIGHT BUT MUST ARRIVE TO THE ROOM BY 8pm.  Minor children may have two parents present. Special consideration for safety and communication needs will be reviewed on a case by case basis.  Special instructions:    Oral Hygiene is also important to reduce your risk of infection.  Remember - BRUSH YOUR TEETH  THE MORNING OF SURGERY WITH YOUR REGULAR TOOTHPASTE   [facility]- Preparing For Surgery  Before surgery, you can play an important role. Because skin is not sterile, your skin needs to be as free of germs as possible. You can reduce the number of germs on your skin by washing with CHG (chlorahexidine gluconate) Soap before surgery.  CHG is an antiseptic cleaner which kills germs and bonds with the skin to continue killing germs even after washing.     Please do not use if you have an allergy to CHG or antibacterial soaps. If your skin becomes reddened/irritated stop using the CHG.  Do not  shave (including legs and underarms) for at least 48 hours prior to first CHG shower. It is OK to shave your face.  Please follow these instructions carefully.     Shower the NIGHT BEFORE SURGERY and the MORNING OF SURGERY with CHG Soap.   If you chose to wash your hair, wash your hair first as usual with your normal shampoo. After you shampoo, rinse your hair and body thoroughly to remove the shampoo.  Then [employer] and genitals (private parts) with your normal soap and rinse thoroughly to remove soap.  After that Use CHG Soap as you would any other liquid soap. You can apply CHG directly to the skin and wash gently with a scrungie or a clean washcloth.   Apply the CHG Soap to your body ONLY FROM THE NECK DOWN.  Do not use on open wounds or open sores. Avoid contact with your eyes, ears, mouth and genitals (private parts). Wash Face and genitals (private parts)  with your normal soap.   Wash thoroughly, paying special attention to the area where your surgery will be performed.  Thoroughly rinse your body with warm water from the neck down.  DO NOT shower/wash with your normal soap after using and rinsing off the CHG Soap.  Pat yourself dry with a CLEAN TOWEL.  Wear CLEAN PAJAMAS to bed the night before surgery  Place CLEAN SHEETS on your bed the night before your surgery  DO NOT SLEEP WITH PETS.   Day of Surgery: Take a shower with CHG soap. Wear Clean/Comfortable clothing the morning of surgery Do not apply any deodorants/lotions.   Remember to brush your teeth WITH YOUR REGULAR TOOTHPASTE.   Please read over the following fact sheets that you were given.

## 2021-06-29 ENCOUNTER — Encounter (HOSPITAL_COMMUNITY): Payer: Self-pay

## 2021-06-29 ENCOUNTER — Encounter (HOSPITAL_COMMUNITY)
Admission: RE | Admit: 2021-06-29 | Discharge: 2021-06-29 | Disposition: A | Discharge status: Home / Self Care | Payer: Medicare HMO | Source: Ambulatory Visit | Attending: Neurosurgery | Admitting: Neurosurgery

## 2021-06-29 ENCOUNTER — Other Ambulatory Visit: Payer: Self-pay

## 2021-06-29 DIAGNOSIS — Z01812 Encounter for preprocedural laboratory examination: Secondary | ICD-10-CM | POA: Insufficient documentation

## 2021-06-29 DIAGNOSIS — Z20822 Contact with and (suspected) exposure to covid-19: Secondary | ICD-10-CM | POA: Insufficient documentation

## 2021-06-29 LAB — BASIC METABOLIC PANEL
Anion gap: 10 (ref 5–15)
BUN: 48 mg/dL — ABNORMAL HIGH (ref 8–23)
CO2: 23 mmol/L (ref 22–32)
Calcium: 9 mg/dL (ref 8.9–10.3)
Chloride: 105 mmol/L (ref 98–111)
Creatinine, Ser: 1.77 mg/dL — ABNORMAL HIGH (ref 0.44–1.00)
GFR, Estimated: 28 mL/min — ABNORMAL LOW (ref >60)
Glucose, Bld: 97 mg/dL (ref 70–99)
Potassium: 4.2 mmol/L (ref 3.5–5.1)
Sodium: 138 mmol/L (ref 135–145)

## 2021-06-29 LAB — CBC
HCT: 41 % (ref 36.0–46.0)
Hemoglobin: 12.8 g/dL (ref 12.0–15.0)
MCH: 30.5 pg (ref 26.0–34.0)
MCHC: 31.2 g/dL (ref 30.0–36.0)
MCV: 97.6 fL (ref 80.0–100.0)
Platelets: 211 10*3/uL (ref 150–400)
RBC: 4.2 MIL/uL (ref 3.87–5.11)
RDW: 14.2 % (ref 11.5–15.5)
WBC: 7.1 10*3/uL (ref 4.0–10.5)
nRBC: 0 % (ref 0.0–0.2)

## 2021-06-29 LAB — TYPE AND SCREEN
ABO/RH(D): A NEG
Antibody Screen: NEGATIVE

## 2021-06-29 LAB — SURGICAL PCR SCREEN
MRSA, PCR: NEGATIVE
Staphylococcus aureus: NEGATIVE

## 2021-06-29 LAB — SARS CORONAVIRUS 2 (TAT 6-24 HRS): SARS Coronavirus 2: NEGATIVE

## 2021-06-29 NOTE — Progress Notes (Signed)
PCP - Dr. Consuello Masse Cardiologist - Dr. [name]  Chest x-ray - n/a EKG - 06/01/21 Stress Test - 03/19/20 ECHO - 2002? Found in a provider's note. Pt does not recall having one done. Cardiac Cath - 12/22/05  Sleep Study - denies CPAP - denies  Blood Thinner Instructions: n/a Aspirin Instructions:LD was over 2 weeks ago.  COVID TEST- 06/29/21; done in PAT.  Anesthesia review: Yes, Hx of CAD. Cardiac clearance noted in Epic.   Patient denies shortness of breath, fever, cough and chest pain at PAT appointment   All instructions explained to the patient, with a verbal understanding of the material. Patient agrees to go over the instructions while at home for a better understanding. Patient also instructed to self quarantine after being tested for COVID-19. The opportunity to ask questions was provided.

## 2021-06-30 NOTE — Progress Notes (Signed)
Anesthesia Chart Review:  [name]ology for history of nonobstructive CAD on medical therapy.  Nuclear stress test 03/19/2020 was low risk, EF 72%.  Last seen by Dr. Domenic Polite 06/01/2021 and at that time her atenolol was reduced due to low resting heart rate.  She was otherwise continued on aspirin, Imdur, and as needed nitroglycerin.  Formal clearance per telephone encounter 06/17/2021, "Chart reviewed as part of pre-operative protocol coverage. Patient was contacted 06/17/2021 in reference to pre-operative risk assessment for pending surgery as outlined below.  Renee Dudley was last seen on 06/01/21 by Dr. Domenic Polite with history of nonobstructive CAD, felt to be doing well. Beta blocker dose was reduced for HR 49bpm. At recent OV, Dr. Domenic Polite acknowledged need for upcoming surgery. No additional testing was ordered at that visit. She had had low risk stress test in 03/2020. I reached out to patient for update on how she is doing. The patient affirms she has been doing well without any new cardiac symptoms. She is mainly just struggling with back pain and is eager to get her surgery done with. Therefore, based on ACC/AHA guidelines, the patient would be at acceptable risk for the planned procedure without further cardiovascular testing. The patient was advised that if she develops new symptoms prior to surgery to contact our office to arrange for a follow-up visit, and she verbalized understanding. Regarding aspirin, based on cardiac history, there is no contraindication to holding ASA if needed for procedure. We generally clear to hold 7 days if needed - will defer ultimate duration to surgeon, but please contact us back if a longer hold is required."  Preop labs reviewed, creatinine elevated at 1.77, BUN 48, remainder of labs unremarkable.  Reviewed records from PCP Dr. Mora Appl office.  History of mild renal insufficiency, last creatinine 1.20 on 04/02/2021.  Given elevated BUN as well as creatinine on  preop labs, suspicion for simple dehydration.  I called and spoke with the patient on 06/30/2021 to review her labs.  She did state that she drank very little water on the day of her preop appointment as it was a 45-minute drive and she did not want to have to stop use the restroom.  She also reports some decreased p.o. intake secondary to severe radicular pain she is experiencing in her leg.  She normally takes Lasix daily for lower extremity edema but did not take it on the day of her appointment.  She did state that she has already taken today's dose.  I recommended she maintain adequate hydration leading up to surgery tomorrow and then we will recheck her labs when she arrives.  I also reviewed these labs with anesthesiologist Dr. Gifford Shave who agreed with plan.  Dr. Saintclair Halsted was also made aware.  EKG 06/01/2021: Marked sinus bradycardia.  Rate 49.  Anterolateral infarct, age undetermined.  Nuclear stress 03/19/2020: No diagnostic ST segment changes to indicate ischemia. Small, mild intensity, apical lateral and basal septal defects that are fixed, more prominent on rest imaging in the septal distribution, and consistent with soft tissue attenuation. No large ischemic territories noted. This is a low risk study. Nuclear stress EF: 72%.  Carotid duplex 10/25/2018: Summary:  Right Carotid: Velocities in the right ICA are consistent with a 1-39% stenosis.  Left Carotid: Velocities in the left ICA are consistent with a 1-39% stenosis.  Vertebrals:  Bilateral vertebral arteries demonstrate antegrade flow.  Subclavians: Normal flow hemodynamics were seen in bilateral subclavian arteries.    Renee Caldwell, PA-C [facility]  Stay Center/Anesthesiology Phone [phone] 06/30/2021 12:25 PM

## 2021-06-30 NOTE — Anesthesia Preprocedure Evaluation (Addendum)
Anesthesia Evaluation  Patient identified by MRN, date of birth, ID band Patient awake    Reviewed: Allergy & Precautions, NPO status , Patient's Chart, lab work & pertinent test results, reviewed documented beta blocker date and time   History of Anesthesia Complications Negative for: history of anesthetic complications  Airway Mallampati: II  TM Distance: >3 FB Neck ROM: Full    Dental  (+) Dental Advisory Given, Missing   Pulmonary neg pulmonary ROS,  06/29/2021 SARS coronavirus NEG   breath sounds clear to auscultation       Cardiovascular hypertension, Pt. on medications and Pt. on home beta blockers  Rhythm:Regular Rate:Normal  03/2020 Stress:  No diagnostic ST segment changes to indicate ischemia. Small, mild intensity, apical lateral and basal septal defects that are fixed, more prominent on rest imaging in the septal distribution, and consistent with soft tissue attenuation. No large ischemic territories noted.  This is a low risk study.  Nuclear stress EF: 72%.   Neuro/Psych  Headaches, Anxiety Depression Chronic back pain    GI/Hepatic Neg liver ROS, GERD  Medicated and Poorly Controlled,  Endo/Other  Morbid obesity  Renal/GU Renal InsufficiencyRenal disease     Musculoskeletal  (+) Arthritis ,   Abdominal (+) + obese,   Peds  Hematology negative hematology ROS (+)   Anesthesia Other Findings   Reproductive/Obstetrics                           Anesthesia Physical Anesthesia Plan  ASA: 3  Anesthesia Plan: General   Post-op Pain Management:    Induction: Intravenous  PONV Risk Score and Plan: 3 and Ondansetron, Dexamethasone and Treatment may vary due to age or medical condition  Airway Management Planned: Oral ETT  Additional Equipment: None  Intra-op Plan:   Post-operative Plan: Extubation in OR  Informed Consent: I have reviewed the patients History and Physical,  chart, labs and discussed the procedure including the risks, benefits and alternatives for the proposed anesthesia with the patient or authorized representative who has indicated his/her understanding and acceptance.     Dental advisory given  Plan Discussed with: CRNA and Surgeon  Anesthesia Plan Comments: (PAT note by Karoline Caldwell, PA-C:  [facility] for history of nonobstructive CAD on medical therapy.  Nuclear stress test 03/19/2020 was low risk, EF 72%.  Last seen by Dr. Domenic Polite 06/01/2021 and at that time her atenolol was reduced due to low resting heart rate.  She was otherwise continued on aspirin, Imdur, and as needed nitroglycerin.  Formal clearance per telephone encounter 06/17/2021, "Chart reviewed as part of pre-operative protocol coverage. Patient was contacted10/5/2022in reference to pre-operative risk assessment for pending surgery as outlined below. Renee Dudley last seen on9/19/22 by Dr. Domenic Polite with history of nonobstructive CAD, felt to be doing well. Beta blocker dose was reduced for HR 49bpm. At recent OV, Dr. Domenic Polite acknowledged need for upcoming surgery. No additional testing was ordered at that visit. She hadhad low risk stress test in 03/2020. I reached out to patient for update on howsheis doing. The patient affirms shehas been doing well without any new cardiac symptoms. She is mainly just struggling with back pain and is eager to get her surgery done with.Therefore, based on ACC/AHA guidelines, the patient would be at acceptable risk for the planned procedure without further cardiovascular testing. The patient was advised that if shedevelops new symptoms prior to surgery to contact our office to arrange for a follow-up visit,  and sheverbalized understanding. Regarding aspirin, based on cardiac history, there is no contraindication to holding ASA if needed for procedure. We generallyclear to hold7 days if needed - will defer ultimate duration to  surgeon, butplease contactus backif alonger hold isrequired."  Preop labs reviewed, creatinine elevated at 1.77, BUN 48, remainder of labs unremarkable.  Reviewed records from PCP Dr. Mora Appl office.  History of mild renal insufficiency, last creatinine 1.20 on 04/02/2021.  Given elevated BUN as well as creatinine on preop labs, suspicion for simple dehydration.  I called and spoke with the patient on 06/30/2021 to review her labs.  She did state that she drank very little water on the day of her preop appointment as it was a 45-minute drive and she did not want to have to stop use the restroom.  She also reports some decreased p.o. intake secondary to severe radicular pain she is experiencing in her leg.  She normally takes Lasix daily for lower extremity edema but did not take it on the day of her appointment.  She did state that she has already taken today's dose.  I recommended she maintain adequate hydration leading up to surgery tomorrow and then we will recheck her labs when she arrives.  I also reviewed these labs with anesthesiologist Dr. Gifford Shave who agreed with plan.  Dr. Saintclair Halsted was also made aware.  EKG 06/01/2021: Marked sinus bradycardia.  Rate 49.  Anterolateral infarct, age undetermined.  Nuclear stress 03/19/2020: . No diagnostic ST segment changes to indicate ischemia. . Small, mild intensity, apical lateral and basal septal defects that are fixed, more prominent on rest imaging in the septal distribution, and consistent with soft tissue attenuation. No large ischemic territories noted. . This is a low risk study. . Nuclear stress EF: 72%.  Carotid duplex 10/25/2018: Summary:  Right Carotid: Velocities in the right ICA are consistent with a 1-39% stenosis.  Left Carotid: Velocities in the left ICA are consistent with a 1-39% stenosis.  Vertebrals: Bilateral vertebral arteries demonstrate antegrade flow.  Subclavians: Normal flow hemodynamics were seen in bilateral subclavian  arteries.   )      Anesthesia Quick Evaluation

## 2021-07-01 ENCOUNTER — Inpatient Hospital Stay (HOSPITAL_COMMUNITY): Payer: Medicare HMO | Admitting: Certified Registered"

## 2021-07-01 ENCOUNTER — Encounter (HOSPITAL_COMMUNITY): Payer: Self-pay | Admitting: Neurosurgery

## 2021-07-01 ENCOUNTER — Other Ambulatory Visit: Payer: Self-pay

## 2021-07-01 ENCOUNTER — Inpatient Hospital Stay (HOSPITAL_COMMUNITY)
Admission: RE | Admit: 2021-07-01 | Discharge: 2021-07-03 | DRG: 455 | Disposition: A | Discharge status: Home Health | Payer: Medicare HMO | Attending: Neurosurgery | Admitting: Neurosurgery

## 2021-07-01 ENCOUNTER — Encounter (HOSPITAL_COMMUNITY)
Admission: RE | Disposition: A | Discharge status: Home Health | Payer: Self-pay | Source: Home / Self Care | Attending: Neurosurgery

## 2021-07-01 ENCOUNTER — Inpatient Hospital Stay (HOSPITAL_COMMUNITY): Payer: Medicare HMO

## 2021-07-01 DIAGNOSIS — M109 Gout, unspecified: Secondary | ICD-10-CM | POA: Diagnosis present

## 2021-07-01 DIAGNOSIS — Z7982 Long term (current) use of aspirin: Secondary | ICD-10-CM

## 2021-07-01 DIAGNOSIS — Z20822 Contact with and (suspected) exposure to covid-19: Secondary | ICD-10-CM | POA: Diagnosis present

## 2021-07-01 DIAGNOSIS — Z96653 Presence of artificial knee joint, bilateral: Secondary | ICD-10-CM | POA: Diagnosis present

## 2021-07-01 DIAGNOSIS — Z419 Encounter for procedure for purposes other than remedying health state, unspecified: Secondary | ICD-10-CM

## 2021-07-01 DIAGNOSIS — E782 Mixed hyperlipidemia: Secondary | ICD-10-CM | POA: Diagnosis present

## 2021-07-01 DIAGNOSIS — M5136 Other intervertebral disc degeneration, lumbar region: Secondary | ICD-10-CM | POA: Diagnosis present

## 2021-07-01 DIAGNOSIS — I10 Essential (primary) hypertension: Secondary | ICD-10-CM | POA: Diagnosis present

## 2021-07-01 DIAGNOSIS — Z880 Allergy status to penicillin: Secondary | ICD-10-CM

## 2021-07-01 DIAGNOSIS — F419 Anxiety disorder, unspecified: Secondary | ICD-10-CM | POA: Diagnosis present

## 2021-07-01 DIAGNOSIS — Z882 Allergy status to sulfonamides status: Secondary | ICD-10-CM | POA: Diagnosis not present

## 2021-07-01 DIAGNOSIS — M4316 Spondylolisthesis, lumbar region: Secondary | ICD-10-CM | POA: Diagnosis present

## 2021-07-01 DIAGNOSIS — Z79899 Other long term (current) drug therapy: Secondary | ICD-10-CM | POA: Diagnosis not present

## 2021-07-01 DIAGNOSIS — Z6838 Body mass index (BMI) 38.0-38.9, adult: Secondary | ICD-10-CM | POA: Diagnosis not present

## 2021-07-01 DIAGNOSIS — K219 Gastro-esophageal reflux disease without esophagitis: Secondary | ICD-10-CM | POA: Diagnosis present

## 2021-07-01 DIAGNOSIS — I2510 Atherosclerotic heart disease of native coronary artery without angina pectoris: Secondary | ICD-10-CM | POA: Diagnosis present

## 2021-07-01 DIAGNOSIS — Z881 Allergy status to other antibiotic agents status: Secondary | ICD-10-CM

## 2021-07-01 DIAGNOSIS — M419 Scoliosis, unspecified: Principal | ICD-10-CM | POA: Diagnosis present

## 2021-07-01 DIAGNOSIS — Z8249 Family history of ischemic heart disease and other diseases of the circulatory system: Secondary | ICD-10-CM

## 2021-07-01 DIAGNOSIS — Z888 Allergy status to other drugs, medicaments and biological substances status: Secondary | ICD-10-CM

## 2021-07-01 DIAGNOSIS — F32A Depression, unspecified: Secondary | ICD-10-CM | POA: Diagnosis present

## 2021-07-01 HISTORY — PX: LUMBAR PERCUTANEOUS PEDICLE SCREW 1 LEVEL: SHX5560

## 2021-07-01 HISTORY — PX: ANTERIOR LAT LUMBAR FUSION: SHX1168

## 2021-07-01 LAB — HEMOGLOBIN A1C
Hgb A1c MFr Bld: 5.5 % (ref 4.8–5.6)
Mean Plasma Glucose: 111.15 mg/dL

## 2021-07-01 LAB — BASIC METABOLIC PANEL
Anion gap: 7 (ref 5–15)
BUN: 38 mg/dL — ABNORMAL HIGH (ref 8–23)
CO2: 27 mmol/L (ref 22–32)
Calcium: 9.2 mg/dL (ref 8.9–10.3)
Chloride: 105 mmol/L (ref 98–111)
Creatinine, Ser: 1.44 mg/dL — ABNORMAL HIGH (ref 0.44–1.00)
GFR, Estimated: 36 mL/min — ABNORMAL LOW (ref >60)
Glucose, Bld: 103 mg/dL — ABNORMAL HIGH (ref 70–99)
Potassium: 4.5 mmol/L (ref 3.5–5.1)
Sodium: 139 mmol/L (ref 135–145)

## 2021-07-01 LAB — GLUCOSE, CAPILLARY: Glucose-Capillary: 142 mg/dL — ABNORMAL HIGH (ref 70–99)

## 2021-07-01 SURGERY — ANTERIOR LATERAL LUMBAR FUSION 1 LEVEL
Anesthesia: General

## 2021-07-01 MED ORDER — SUCCINYLCHOLINE CHLORIDE 200 MG/10ML IV SOSY
PREFILLED_SYRINGE | INTRAVENOUS | Status: DC | PRN
Start: 2021-07-01 — End: 2021-07-01
  Administered 2021-07-01: 140 mg via INTRAVENOUS

## 2021-07-01 MED ORDER — MIDAZOLAM HCL 2 MG/2ML IJ SOLN
INTRAMUSCULAR | Status: AC
  Filled 2021-07-01: qty 2

## 2021-07-01 MED ORDER — GLYCOPYRROLATE 0.2 MG/ML IJ SOLN
INTRAMUSCULAR | Status: DC | PRN
  Administered 2021-07-01 (×2): .1 mg via INTRAVENOUS

## 2021-07-01 MED ORDER — ISOSORBIDE MONONITRATE ER 30 MG PO TB24
15.0000 mg | ORAL_TABLET | Freq: Every day | ORAL | Status: DC
  Administered 2021-07-02 – 2021-07-03 (×2): 15 mg via ORAL
  Filled 2021-07-01 (×2): qty 1

## 2021-07-01 MED ORDER — ACETAMINOPHEN 325 MG PO TABS: 650.0000 mg | ORAL_TABLET | ORAL | Status: DC | PRN

## 2021-07-01 MED ORDER — HYDROMORPHONE HCL 1 MG/ML IJ SOLN
INTRAMUSCULAR | Status: AC
  Filled 2021-07-01: qty 1

## 2021-07-01 MED ORDER — LIDOCAINE 2% (20 MG/ML) 5 ML SYRINGE
INTRAMUSCULAR | Status: DC | PRN
  Administered 2021-07-01: 20 mg via INTRAVENOUS

## 2021-07-01 MED ORDER — ONDANSETRON HCL 4 MG/2ML IJ SOLN: 4.0000 mg | Freq: Four times a day (QID) | INTRAMUSCULAR | Status: DC | PRN

## 2021-07-01 MED ORDER — THROMBIN 5000 UNITS EX SOLR
CUTANEOUS | Status: AC
  Filled 2021-07-01: qty 5000

## 2021-07-01 MED ORDER — CHLORHEXIDINE GLUCONATE CLOTH 2 % EX PADS: 6.0000 | MEDICATED_PAD | Freq: Once | CUTANEOUS | Status: DC

## 2021-07-01 MED ORDER — PROPOFOL 10 MG/ML IV BOLUS
INTRAVENOUS | Status: DC | PRN
  Administered 2021-07-01: 120 mg via INTRAVENOUS

## 2021-07-01 MED ORDER — BUPIVACAINE LIPOSOME 1.3 % IJ SUSP
INTRAMUSCULAR | Status: AC
  Filled 2021-07-01: qty 20

## 2021-07-01 MED ORDER — SERTRALINE HCL 50 MG PO TABS
25.0000 mg | ORAL_TABLET | Freq: Every day | ORAL | Status: DC
  Administered 2021-07-02 – 2021-07-03 (×2): 25 mg via ORAL
  Filled 2021-07-01 (×2): qty 1

## 2021-07-01 MED ORDER — VANCOMYCIN HCL IN DEXTROSE 1-5 GM/200ML-% IV SOLN
1000.0000 mg | INTRAVENOUS | Status: DC
  Administered 2021-07-02 – 2021-07-03 (×2): 1000 mg via INTRAVENOUS
  Filled 2021-07-01 (×2): qty 200

## 2021-07-01 MED ORDER — PROPOFOL 10 MG/ML IV BOLUS
INTRAVENOUS | Status: AC
  Filled 2021-07-01: qty 20

## 2021-07-01 MED ORDER — COLCHICINE 0.6 MG PO TABS
0.6000 mg | ORAL_TABLET | Freq: Every day | ORAL | Status: DC | PRN
  Filled 2021-07-01: qty 1

## 2021-07-01 MED ORDER — DEXAMETHASONE SODIUM PHOSPHATE 10 MG/ML IJ SOLN
10.0000 mg | Freq: Once | INTRAMUSCULAR | Status: AC
  Administered 2021-07-01: 10 mg via INTRAVENOUS

## 2021-07-01 MED ORDER — VANCOMYCIN HCL 1500 MG/300ML IV SOLN
1500.0000 mg | INTRAVENOUS | Status: AC
  Administered 2021-07-01: 1500 mg via INTRAVENOUS
  Filled 2021-07-01: qty 300

## 2021-07-01 MED ORDER — PHENYLEPHRINE HCL-NACL 20-0.9 MG/250ML-% IV SOLN
INTRAVENOUS | Status: DC | PRN
Start: 2021-07-01 — End: 2021-07-01
  Administered 2021-07-01: 50 ug/min via INTRAVENOUS

## 2021-07-01 MED ORDER — FENTANYL CITRATE (PF) 250 MCG/5ML IJ SOLN
INTRAMUSCULAR | Status: AC
  Filled 2021-07-01: qty 5

## 2021-07-01 MED ORDER — HYDROMORPHONE HCL 1 MG/ML IJ SOLN: 0.5000 mg | INTRAMUSCULAR | Status: DC | PRN

## 2021-07-01 MED ORDER — INSULIN ASPART 100 UNIT/ML IJ SOLN: 0.0000 [IU] | Freq: Three times a day (TID) | INTRAMUSCULAR | Status: DC

## 2021-07-01 MED ORDER — EPHEDRINE 5 MG/ML INJ
INTRAVENOUS | Status: AC
  Filled 2021-07-01: qty 5

## 2021-07-01 MED ORDER — PANTOPRAZOLE SODIUM 40 MG IV SOLR: 40.0000 mg | Freq: Every day | INTRAVENOUS | Status: DC

## 2021-07-01 MED ORDER — ROCURONIUM BROMIDE 10 MG/ML (PF) SYRINGE
PREFILLED_SYRINGE | INTRAVENOUS | Status: DC | PRN
  Administered 2021-07-01: 50 mg via INTRAVENOUS

## 2021-07-01 MED ORDER — MENTHOL 3 MG MT LOZG: 1.0000 | LOZENGE | OROMUCOSAL | Status: DC | PRN

## 2021-07-01 MED ORDER — CHLORHEXIDINE GLUCONATE 0.12 % MT SOLN
15.0000 mL | Freq: Once | OROMUCOSAL | Status: AC
  Administered 2021-07-01: 15 mL via OROMUCOSAL
  Filled 2021-07-01: qty 15

## 2021-07-01 MED ORDER — ALUM & MAG HYDROXIDE-SIMETH 200-200-20 MG/5ML PO SUSP: 30.0000 mL | Freq: Four times a day (QID) | ORAL | Status: DC | PRN

## 2021-07-01 MED ORDER — RISAQUAD PO CAPS
1.0000 | ORAL_CAPSULE | Freq: Every day | ORAL | Status: DC
  Administered 2021-07-01 – 2021-07-03 (×3): 1 via ORAL
  Filled 2021-07-01 (×3): qty 1

## 2021-07-01 MED ORDER — SUGAMMADEX SODIUM 200 MG/2ML IV SOLN
INTRAVENOUS | Status: DC | PRN
  Administered 2021-07-01: 200 mg via INTRAVENOUS

## 2021-07-01 MED ORDER — ONDANSETRON HCL 4 MG/2ML IJ SOLN
INTRAMUSCULAR | Status: AC
  Filled 2021-07-01: qty 2

## 2021-07-01 MED ORDER — SODIUM CHLORIDE 0.9% FLUSH
3.0000 mL | Freq: Two times a day (BID) | INTRAVENOUS | Status: DC
  Administered 2021-07-01 – 2021-07-03 (×3): 3 mL via INTRAVENOUS

## 2021-07-01 MED ORDER — ALLOPURINOL 100 MG PO TABS: 100.0000 mg | ORAL_TABLET | Freq: Every day | ORAL | Status: DC | PRN

## 2021-07-01 MED ORDER — HYDROMORPHONE HCL 1 MG/ML IJ SOLN
0.2500 mg | INTRAMUSCULAR | Status: DC | PRN
  Administered 2021-07-01 (×2): 0.5 mg via INTRAVENOUS

## 2021-07-01 MED ORDER — MIDAZOLAM HCL 2 MG/2ML IJ SOLN
INTRAMUSCULAR | Status: DC | PRN
  Administered 2021-07-01: 2 mg via INTRAVENOUS

## 2021-07-01 MED ORDER — PROPOFOL 500 MG/50ML IV EMUL
INTRAVENOUS | Status: DC | PRN
  Administered 2021-07-01: 75 ug/kg/min via INTRAVENOUS
  Administered 2021-07-01: 50 ug/kg/min via INTRAVENOUS

## 2021-07-01 MED ORDER — PANTOPRAZOLE SODIUM 40 MG PO TBEC
40.0000 mg | DELAYED_RELEASE_TABLET | Freq: Every day | ORAL | Status: DC
  Administered 2021-07-02 – 2021-07-03 (×2): 40 mg via ORAL
  Filled 2021-07-01 (×2): qty 1

## 2021-07-01 MED ORDER — EPHEDRINE SULFATE-NACL 50-0.9 MG/10ML-% IV SOSY
PREFILLED_SYRINGE | INTRAVENOUS | Status: DC | PRN
  Administered 2021-07-01 (×2): 10 mg via INTRAVENOUS

## 2021-07-01 MED ORDER — CYCLOBENZAPRINE HCL 10 MG PO TABS: 10.0000 mg | ORAL_TABLET | Freq: Three times a day (TID) | ORAL | Status: DC | PRN

## 2021-07-01 MED ORDER — SODIUM CHLORIDE 0.9% FLUSH: 3.0000 mL | INTRAVENOUS | Status: DC | PRN

## 2021-07-01 MED ORDER — OXYCODONE HCL 5 MG PO TABS: 5.0000 mg | ORAL_TABLET | Freq: Once | ORAL | Status: DC | PRN

## 2021-07-01 MED ORDER — FAMOTIDINE 20 MG PO TABS
20.0000 mg | ORAL_TABLET | Freq: Two times a day (BID) | ORAL | Status: DC
  Administered 2021-07-01 – 2021-07-03 (×4): 20 mg via ORAL
  Filled 2021-07-01 (×4): qty 1

## 2021-07-01 MED ORDER — PROSIGHT PO TABS
1.0000 | ORAL_TABLET | Freq: Every day | ORAL | Status: DC
Start: 2021-07-01 — End: 2021-07-03
  Administered 2021-07-01 – 2021-07-03 (×3): 1 via ORAL
  Filled 2021-07-01 (×3): qty 1

## 2021-07-01 MED ORDER — DEXAMETHASONE SODIUM PHOSPHATE 10 MG/ML IJ SOLN
INTRAMUSCULAR | Status: AC
  Filled 2021-07-01: qty 1

## 2021-07-01 MED ORDER — MUSCLE RUB 10-15 % EX CREA: 1.0000 "application " | TOPICAL_CREAM | Freq: Every day | CUTANEOUS | Status: DC | PRN

## 2021-07-01 MED ORDER — ALPRAZOLAM 0.25 MG PO TABS
0.2500 mg | ORAL_TABLET | Freq: Every day | ORAL | Status: DC
  Administered 2021-07-01 – 2021-07-02 (×2): 0.25 mg via ORAL
  Filled 2021-07-01 (×2): qty 1

## 2021-07-01 MED ORDER — ACETAMINOPHEN 650 MG RE SUPP: 650.0000 mg | RECTAL | Status: DC | PRN

## 2021-07-01 MED ORDER — LIDOCAINE-EPINEPHRINE 1 %-1:100000 IJ SOLN
INTRAMUSCULAR | Status: DC | PRN
  Administered 2021-07-01 (×2): 10 mL

## 2021-07-01 MED ORDER — ATENOLOL 25 MG PO TABS
25.0000 mg | ORAL_TABLET | Freq: Every day | ORAL | Status: DC
  Administered 2021-07-03: 25 mg via ORAL
  Filled 2021-07-01 (×2): qty 1

## 2021-07-01 MED ORDER — PHENYLEPHRINE 40 MCG/ML (10ML) SYRINGE FOR IV PUSH (FOR BLOOD PRESSURE SUPPORT)
PREFILLED_SYRINGE | INTRAVENOUS | Status: AC
  Filled 2021-07-01: qty 10

## 2021-07-01 MED ORDER — MIDAZOLAM HCL 2 MG/2ML IJ SOLN: 0.5000 mg | Freq: Once | INTRAMUSCULAR | Status: DC | PRN

## 2021-07-01 MED ORDER — OXYCODONE HCL 5 MG/5ML PO SOLN: 5.0000 mg | Freq: Once | ORAL | Status: DC | PRN

## 2021-07-01 MED ORDER — BUPIVACAINE-EPINEPHRINE (PF) 0.25% -1:200000 IJ SOLN
INTRAMUSCULAR | Status: AC
  Filled 2021-07-01: qty 30

## 2021-07-01 MED ORDER — SUCCINYLCHOLINE CHLORIDE 200 MG/10ML IV SOSY
PREFILLED_SYRINGE | INTRAVENOUS | Status: AC
  Filled 2021-07-01: qty 10

## 2021-07-01 MED ORDER — PHENOL 1.4 % MT LIQD: 1.0000 | OROMUCOSAL | Status: DC | PRN

## 2021-07-01 MED ORDER — FUROSEMIDE 40 MG PO TABS
40.0000 mg | ORAL_TABLET | Freq: Every day | ORAL | Status: DC
  Administered 2021-07-01 – 2021-07-02 (×2): 40 mg via ORAL
  Filled 2021-07-01 (×2): qty 1

## 2021-07-01 MED ORDER — PRAMIPEXOLE DIHYDROCHLORIDE 0.25 MG PO TABS
0.5000 mg | ORAL_TABLET | Freq: Every day | ORAL | Status: DC
  Administered 2021-07-01 – 2021-07-02 (×2): 0.5 mg via ORAL
  Filled 2021-07-01 (×4): qty 2

## 2021-07-01 MED ORDER — LIDOCAINE 2% (20 MG/ML) 5 ML SYRINGE
INTRAMUSCULAR | Status: AC
  Filled 2021-07-01: qty 5

## 2021-07-01 MED ORDER — VITAMIN D 25 MCG (1000 UNIT) PO TABS
2000.0000 [IU] | ORAL_TABLET | Freq: Every day | ORAL | Status: DC
  Administered 2021-07-01 – 2021-07-03 (×3): 2000 [IU] via ORAL
  Filled 2021-07-01 (×3): qty 2

## 2021-07-01 MED ORDER — GLYCOPYRROLATE PF 0.2 MG/ML IJ SOSY
PREFILLED_SYRINGE | INTRAMUSCULAR | Status: AC
  Filled 2021-07-01: qty 1

## 2021-07-01 MED ORDER — ASPIRIN EC 81 MG PO TBEC
81.0000 mg | DELAYED_RELEASE_TABLET | Freq: Every day | ORAL | Status: DC
  Administered 2021-07-01 – 2021-07-03 (×3): 81 mg via ORAL
  Filled 2021-07-01 (×3): qty 1

## 2021-07-01 MED ORDER — PROMETHAZINE HCL 25 MG/ML IJ SOLN: 6.2500 mg | INTRAMUSCULAR | Status: DC | PRN

## 2021-07-01 MED ORDER — LACTATED RINGERS IV SOLN: INTRAVENOUS | Status: DC

## 2021-07-01 MED ORDER — ACETAMINOPHEN 500 MG PO TABS
1000.0000 mg | ORAL_TABLET | Freq: Once | ORAL | Status: AC
  Administered 2021-07-01: 1000 mg via ORAL
  Filled 2021-07-01: qty 2

## 2021-07-01 MED ORDER — BUPIVACAINE LIPOSOME 1.3 % IJ SUSP
INTRAMUSCULAR | Status: DC | PRN
Start: 2021-07-01 — End: 2021-07-01
  Administered 2021-07-01: 20 mL

## 2021-07-01 MED ORDER — SODIUM CHLORIDE 0.9 % IV SOLN
250.0000 mL | INTRAVENOUS | Status: DC
  Administered 2021-07-01: 250 mL via INTRAVENOUS

## 2021-07-01 MED ORDER — HEMOSTATIC AGENTS (NO CHARGE) OPTIME
TOPICAL | Status: DC | PRN
  Administered 2021-07-01: 1 via TOPICAL

## 2021-07-01 MED ORDER — FENTANYL CITRATE (PF) 250 MCG/5ML IJ SOLN
INTRAMUSCULAR | Status: DC | PRN
  Administered 2021-07-01 (×2): 50 ug via INTRAVENOUS
  Administered 2021-07-01: 100 ug via INTRAVENOUS
  Administered 2021-07-01 (×3): 50 ug via INTRAVENOUS
  Administered 2021-07-01: 100 ug via INTRAVENOUS

## 2021-07-01 MED ORDER — ONDANSETRON HCL 4 MG PO TABS: 4.0000 mg | ORAL_TABLET | Freq: Four times a day (QID) | ORAL | Status: DC | PRN

## 2021-07-01 MED ORDER — THROMBIN 5000 UNITS EX SOLR
CUTANEOUS | Status: DC | PRN
  Administered 2021-07-01 (×2): 5000 [IU] via TOPICAL

## 2021-07-01 MED ORDER — LIDOCAINE-EPINEPHRINE 1 %-1:100000 IJ SOLN
INTRAMUSCULAR | Status: AC
  Filled 2021-07-01: qty 1

## 2021-07-01 MED ORDER — ORAL CARE MOUTH RINSE: 15.0000 mL | Freq: Once | OROMUCOSAL | Status: AC

## 2021-07-01 MED ORDER — ROCURONIUM BROMIDE 10 MG/ML (PF) SYRINGE
PREFILLED_SYRINGE | INTRAVENOUS | Status: AC
  Filled 2021-07-01: qty 10

## 2021-07-01 MED ORDER — OXYCODONE HCL 5 MG PO TABS
10.0000 mg | ORAL_TABLET | ORAL | Status: DC | PRN
Start: 2021-07-01 — End: 2021-07-03
  Administered 2021-07-01 – 2021-07-02 (×3): 10 mg via ORAL
  Filled 2021-07-01 (×3): qty 2

## 2021-07-01 MED ORDER — 0.9 % SODIUM CHLORIDE (POUR BTL) OPTIME
TOPICAL | Status: DC | PRN
  Administered 2021-07-01: 1000 mL

## 2021-07-01 SURGICAL SUPPLY — 84 items
ADH SKN CLS APL DERMABOND .7 (GAUZE/BANDAGES/DRESSINGS) ×2
APL SKNCLS STERI-STRIP NONHPOA (GAUZE/BANDAGES/DRESSINGS) ×1
BAG COUNTER SPONGE SURGICOUNT (BAG) ×4 IMPLANT
BAG SPNG CNTER NS LX DISP (BAG) ×2
BENZOIN TINCTURE PRP APPL 2/3 (GAUZE/BANDAGES/DRESSINGS) ×4 IMPLANT
BLADE CLIPPER SURG (BLADE) IMPLANT
BONE MATRIX OSTEOCEL PRO MED (Bone Implant) ×1 IMPLANT
BONE VIVIGEN FORMABLE 5.4CC (Bone Implant) ×2 IMPLANT
BUR MATCHSTICK NEURO 3.0 LAGG (BURR) ×1 IMPLANT
CAP LOCKING THREADED (Cap) ×4 IMPLANT
CAP PUSHER PTP (ORTHOPEDIC DISPOSABLE SUPPLIES) ×1 IMPLANT
CARTRIDGE OIL MAESTRO DRILL (MISCELLANEOUS) ×1 IMPLANT
CLIP SPRING STIM LLIF SAFEOP (CLIP) ×1 IMPLANT
CLSR STERI-STRIP ANTIMIC 1/2X4 (GAUZE/BANDAGES/DRESSINGS) ×1 IMPLANT
CNTNR URN SCR LID CUP LEK RST (MISCELLANEOUS) ×1 IMPLANT
CONT SPEC 4OZ STRL OR WHT (MISCELLANEOUS) ×2
COVER BACK TABLE 60X90IN (DRAPES) ×2 IMPLANT
DERMABOND ADVANCED (GAUZE/BANDAGES/DRESSINGS) ×2
DERMABOND ADVANCED .7 DNX12 (GAUZE/BANDAGES/DRESSINGS) ×3 IMPLANT
DIFFUSER DRILL AIR PNEUMATIC (MISCELLANEOUS) ×2 IMPLANT
DILATOR INSULATED SAFEOP OVAL (NEUROSURGERY SUPPLIES) ×1 IMPLANT
DRAPE C-ARM 42X72 X-RAY (DRAPES) ×4 IMPLANT
DRAPE C-ARMOR (DRAPES) ×4 IMPLANT
DRAPE LAPAROTOMY 100X72X124 (DRAPES) ×4 IMPLANT
DRAPE SURG 17X23 STRL (DRAPES) ×6 IMPLANT
DRSG OPSITE POSTOP 4X6 (GAUZE/BANDAGES/DRESSINGS) ×1 IMPLANT
ELECT BLADE 4.0 EZ CLEAN MEGAD (MISCELLANEOUS) ×2
ELECT KIT SAFEOP SSEP/SURF (KITS) ×2
ELECT REM PT RETURN 9FT ADLT (ELECTROSURGICAL) ×4
ELECTRODE BLDE 4.0 EZ CLN MEGD (MISCELLANEOUS) IMPLANT
ELECTRODE KT SAFEOP SSEP/SURF (KITS) IMPLANT
ELECTRODE REM PT RTRN 9FT ADLT (ELECTROSURGICAL) ×2 IMPLANT
EVACUATOR 1/8 PVC DRAIN (DRAIN) ×1 IMPLANT
GAUZE 4X4 16PLY ~~LOC~~+RFID DBL (SPONGE) IMPLANT
GAUZE SPONGE 4X4 12PLY STRL (GAUZE/BANDAGES/DRESSINGS) ×2 IMPLANT
GAUZE SPONGE 4X4 12PLY STRL LF (GAUZE/BANDAGES/DRESSINGS) ×1 IMPLANT
GLOVE EXAM NITRILE XL STR (GLOVE) IMPLANT
GLOVE SURG ENC MOIS LTX SZ7 (GLOVE) IMPLANT
GLOVE SURG ENC MOIS LTX SZ8 (GLOVE) ×4 IMPLANT
GLOVE SURG UNDER LTX SZ8.5 (GLOVE) ×4 IMPLANT
GLOVE SURG UNDER POLY LF SZ7 (GLOVE) IMPLANT
GOWN STRL REUS W/ TWL LRG LVL3 (GOWN DISPOSABLE) ×1 IMPLANT
GOWN STRL REUS W/ TWL XL LVL3 (GOWN DISPOSABLE) ×2 IMPLANT
GOWN STRL REUS W/TWL 2XL LVL3 (GOWN DISPOSABLE) IMPLANT
GOWN STRL REUS W/TWL LRG LVL3 (GOWN DISPOSABLE) ×2
GOWN STRL REUS W/TWL XL LVL3 (GOWN DISPOSABLE) ×4
GRAFT BNE MATRIX VG FRMBL MD 5 (Bone Implant) IMPLANT
GRAFT BONE PROTEIOS LRG 5CC (Orthopedic Implant) ×1 IMPLANT
GUIDEWIRE LLIF TT 320 (WIRE) ×2 IMPLANT
HEMOSTAT POWDER KIT SURGIFOAM (HEMOSTASIS) ×2 IMPLANT
KIT BASIN OR (CUSTOM PROCEDURE TRAY) ×4 IMPLANT
KIT TURNOVER KIT B (KITS) ×4 IMPLANT
KNIFE ANNULOTOMY GREY RETRACT (ORTHOPEDIC DISPOSABLE SUPPLIES) ×1 IMPLANT
LIF ILLUMINATION SYSTEM STERIL (SYSTAGENIX WOUND MANAGEMENT) ×2
NDL HYPO 25X1 1.5 SAFETY (NEEDLE) ×1 IMPLANT
NEEDLE HYPO 22GX1.5 SAFETY (NEEDLE) ×2 IMPLANT
NEEDLE HYPO 25X1 1.5 SAFETY (NEEDLE) ×2 IMPLANT
NS IRRIG 1000ML POUR BTL (IV SOLUTION) ×4 IMPLANT
OIL CARTRIDGE MAESTRO DRILL (MISCELLANEOUS) ×2
PACK LAMINECTOMY NEURO (CUSTOM PROCEDURE TRAY) ×4 IMPLANT
PROBE BALL TIP LLIF SAFEOP (NEUROSURGERY SUPPLIES) ×1 IMPLANT
ROD CREO 45MM SPINAL (Rod) ×2 IMPLANT
SCREW PA THRD CREO TULIP 5.5X4 (Head) ×4 IMPLANT
SHAFT CREO 30MM (Neuro Prosthesis/Implant) ×4 IMPLANT
SHIM INTRADISCAL LTP N DISP (ORTHOPEDIC DISPOSABLE SUPPLIES) ×1 IMPLANT
SPACER IDENTITI 10X18X45 0DEG (Spacer) ×1 IMPLANT
SPACER IDENTITI NT 10X18X45 0D (Spacer) ×1 IMPLANT
SPONGE SURGIFOAM ABS GEL SZ50 (HEMOSTASIS) IMPLANT
SPONGE T-LAP 4X18 ~~LOC~~+RFID (SPONGE) IMPLANT
STAPLER SKIN PROX WIDE 3.9 (STAPLE) ×2 IMPLANT
STRIP CLOSURE SKIN 1/2X4 (GAUZE/BANDAGES/DRESSINGS) ×4 IMPLANT
SUT VIC AB 0 CT1 18XCR BRD8 (SUTURE) ×1 IMPLANT
SUT VIC AB 0 CT1 8-18 (SUTURE) ×2
SUT VIC AB 2-0 CT1 18 (SUTURE) ×6 IMPLANT
SUT VIC AB 4-0 PS2 18 (SUTURE) ×2 IMPLANT
SUT VIC AB 4-0 PS2 27 (SUTURE) ×2 IMPLANT
SUT VICRYL 4-0 PS2 18IN ABS (SUTURE) ×4 IMPLANT
SYSTEM ILLUMINATION LIF STERIL (SYSTAGENIX WOUND MANAGEMENT) IMPLANT
TAP SURG AMP CREO 5.5 (TAP) ×1 IMPLANT
TOWEL GREEN STERILE (TOWEL DISPOSABLE) ×4 IMPLANT
TOWEL GREEN STERILE FF (TOWEL DISPOSABLE) ×4 IMPLANT
TRAY FOLEY MTR SLVR 16FR STAT (SET/KITS/TRAYS/PACK) ×2 IMPLANT
TUBE CONNECTING 12X1/4 (SUCTIONS) ×1 IMPLANT
WATER STERILE IRR 1000ML POUR (IV SOLUTION) ×4 IMPLANT

## 2021-07-01 NOTE — Progress Notes (Signed)
Orthopedic Tech Progress Note Patient Details:  Renee Dudley 1935/12/23 [id number] Patient has BACK BRACE   Patient ID: [name], female   DOB: Mar 30, 1936, 85 y.o.   MRN: [id number]  [name] 07/01/2021, 5:49 PM

## 2021-07-01 NOTE — Op Note (Signed)
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  After repositioning through a separate skin incision posterior cortical screw placement L2-3 utilizing the globus Creo modular cortical screw set  3.  Posterior lateral arthrodesis L2-3 utilizing vivid gin and partialis  Surgeon: Dominica Severin Blimi Godby  Assistant: Nash Shearer  EBL: 300  Anesthesia: General  HPI: 85 year old female previously undergone a right-sided laminotomy at L2-3 and did fairly well presents with progressive worsening back pain right greater than left hip and leg pain and work-up revealing degenerative spondylolisthesis at L2-3 with noted degenerative rotational scoliosis at that level.  Due to patient's progression of clinical syndrome imaging findings and failed conservative treatment I recommended anterior lateral interbody fusion L2-3 with posterior augmentation cortical screws.  I extensively went over the risks and benefits of the operation with her as well as perioperative course expectations of outcome and alternatives to surgery and she understood and agreed to proceed forward.  Operative procedure: Patient was initially positioned in the left lateral decubitus left side up for the anterior lateral interbody fusion so utilizing intraoperative neuro monitoring and after all electrodes placed patient was positioned x-ray confirmed good position and patient was taped to the bed and her left side of her back was prepped and draped in routine sterile fashion utilizing 2 incision technique I opened up both incisions did identify the retroperitoneal space through the posterior incision swept up and had to remove part of the 12th floating rib to passed the  dilator down on my finger and down to the interspace.  This was all done without event.  Fluoroscopy confirmed good docking position intraoperative neuro monitoring confirmed that to be a safe zone so I placed the Steinmann pin sequential dilation allowed me to place a retractor and I docked in the 30 yard line of the disc base.  Stimulated in 3 to 6 degree orientation and deployed the shim then opened up the retractor and incised the disc base prepared the endplates and with sequential dilators dilated up to an 8 lordotic implant however it felt this over distracted the disc base so I removed I selected a 10 parallel which would not open up the anterior margin of the disc base as much.  After adequate endplate preparation I selected a 10 lordotic cage and inserted that under fluoroscopy and confirmed to be in good position.  Then after the wound was copiously irrigated meticulous hemostasis was maintained and adequate counts were achieved with no instrumentation or sponges left behind both those lateral incisions were closed with interrupted Vicryl and a running 4 subcuticular.  Patient was then repositioned prone on the Wilson frame for the posterior part of the operation.  Posterior aspect of her back was prepped and draped in routine sterile fashion roll incision was opened up and extended cephalad subperiosteal dissection was carried through the scar tissue and on the lamina and pars bilaterally at L2-3.  Intraoperative x-ray confirmed indication appropriate level and then utilizing AP and lateral fluoroscopy I selected entry points in the inferomedial aspect of each pedicle placed cortical screws I did reposition the left-sided L2 as it was hugging the medial border of the pedicle on AP fluoroscopy.  So I repositioned that and directed it more laterally and after repositioning it fluoroscopy confirmed to  be in good position.  Then assembled the heads aggressively decorticated the facets lateral pars at that  level and packed the vivid gin and Proteus mixture laterally along the pars and facet joint.  Then the rods were then placed tightened down the L3 screw I did do a little distraction to help reduce some of her lordosis and all this was done very well and everything was anchored in place and tied down I placed a medium Hemovac drain injected Exparel in the fascia maintain meticulous hemostasis and closed the wound in layers with interrupted Vicryl and a running 4 subcuticular Dermabond benzoin Steri-Strips and sterile dressing was applied patient recovery in stable condition.  At the end the case all needle count sponge counts were correct.

## 2021-07-01 NOTE — Transfer of Care (Signed)
Immediate Anesthesia Transfer of Care Note  Patient: Renee Dudley  Procedure(s) Performed: Anterior lateral lumbar fusion - Lateral two-lateral three - Interbody Fusion LUMBAR PERCUTANEOUS PEDICLE SCREW 1 LEVEL  Patient Location: PACU  Anesthesia Type:General  Level of Consciousness: drowsy and patient cooperative  Airway & Oxygen Therapy: Patient Spontanous Breathing  Post-op Assessment: Report given to RN and Post -op Vital signs reviewed and stable  Post vital signs: Reviewed and stable  Last Vitals:  Vitals Value Taken Time  BP    Temp    Pulse    Resp    SpO2      Last Pain:  Vitals:   07/01/21 0714  TempSrc:   PainSc: 10-Worst pain ever      Patients Stated Pain Goal: 1 (47/18/55 0158)  Complications: No notable events documented.

## 2021-07-01 NOTE — Progress Notes (Signed)
Pharmacy Antibiotic Note  Renee Dudley is a 85 y.o. female admitted on 07/01/2021 with surgical prophylaxis.  Pharmacy has been consulted for Vancomycin dosing.  Has a drain in place  Plan: Vancomycin 1 gram iv Q 24 hours starting 07/02/21 DC Vancomycin when drain is out  Height: 5\' 6"  (167.6 cm) Weight: 108.9 kg (240 lb) IBW/kg (Calculated) : 59.3  Temp (24hrs), Avg:97.2 F (36.2 C), Min:97 F (36.1 C), Max:97.6 F (36.4 C)  Recent Labs  Lab 06/29/21 1435 07/01/21 0748  WBC 7.1  --   CREATININE 1.77* 1.44*    Estimated Creatinine Clearance: 37 mL/min (A) (by C-G formula based on SCr of 1.44 mg/dL (H)).    Allergies  Allergen Reactions   Penicillins Other (See Comments)    Pt can remember, happened 50 years ago Has patient had a PCN reaction causing immediate rash, facial/tongue/throat swelling, SOB or lightheadedness with hypotension: Unknown Has patient had a PCN reaction causing severe rash involving mucus membranes or skin necrosis: Unknown Has patient had a PCN reaction that required hospitalization:was inpatient at time of reaction Has patient had a PCN reaction occurring within the last 10 years: No If all of the above answers are "NO", then may proceed with C   Levofloxacin     Weakness    Zocor [Simvastatin]     UNSPECIFIED REACTION    Sulfa Antibiotics Nausea And Vomiting   Thank you for allowing pharmacy to be a part of this patient's care. Anette Guarneri, PharmD 07/01/2021 3:22 PM

## 2021-07-01 NOTE — Anesthesia Postprocedure Evaluation (Signed)
Anesthesia Post Note  Patient: Renee Dudley  Procedure(s) Performed: Anterior lateral lumbar fusion - Lateral two-lateral three - Interbody Fusion LUMBAR PERCUTANEOUS PEDICLE SCREW 1 LEVEL     Patient location during evaluation: PACU Anesthesia Type: General Level of consciousness: awake and alert, patient cooperative and oriented Pain management: pain level controlled Vital Signs Assessment: post-procedure vital signs reviewed and stable Respiratory status: spontaneous breathing, nonlabored ventilation and respiratory function stable Cardiovascular status: blood pressure returned to baseline and stable Postop Assessment: no apparent nausea or vomiting and able to ambulate (sitting in chair) Anesthetic complications: no   No notable events documented.  Last Vitals:  Vitals:   07/01/21 1405 07/01/21 1420  BP: (!) 113/55 (!) 113/53  Pulse: (!) 51 (!) 57  Resp: 14 13  Temp:  (!) 36.1 C  SpO2: 92% 99%    Last Pain:  Vitals:   07/01/21 1420  TempSrc:   PainSc: Asleep                 Mariachristina Holle,E. Sheryle Vice

## 2021-07-01 NOTE — H&P (Signed)
Renee DOUBLEDAY is an 85 y.o. female.   Chief Complaint: Back right greater than left leg pain HPI: 85 year old female progressive worsening back right hip and leg pain rating down anterior quad little bit below her knee.  Work-up revealed severe degenerative disc disease and a slight listhesis at L2-3.  Due to patient's progression of clinical syndrome imaging findings and failed conservative treatment I recommended anterior lateral interbody fusion at L2-3 with posterior cortical screw augmentation.  I extensively gone over the risks and benefits of that operation with her as well as perioperative course expectations of outcome and alternatives of surgery and she understood and agreed to proceed forward.  Past Medical History:  Diagnosis Date   Anxiety    Chronic headaches    Coronary atherosclerosis of native coronary artery    Nonobstructive at cardiac catheterization 2007    Depression    DJD (degenerative joint disease)    Essential hypertension    GERD (gastroesophageal reflux disease)    History of gout    Mixed hyperlipidemia    Peripheral edema    Primary localized osteoarthritis of left knee    Renal insufficiency    Varicose veins     Past Surgical History:  Procedure Laterality Date   APPENDECTOMY     Cataract surgery Bilateral    CHOLECYSTECTOMY  1986   COLONOSCOPY     LUMBAR LAMINECTOMY/DECOMPRESSION MICRODISCECTOMY Right 02/23/2017   Procedure: Right Lumbar Two-Three Microdiscectomy;  Surgeon: Kary Kos, MD;  Location: [facility];  Service: Neurosurgery;  Laterality: Right;   Right total knee arthroplasty  2011   TOTAL ABDOMINAL HYSTERECTOMY W/ BILATERAL SALPINGOOPHORECTOMY  1988   TOTAL KNEE ARTHROPLASTY Left 12/23/2014   Procedure: LEFT TOTAL KNEE ARTHROPLASTY;  Surgeon: Elsie Saas, MD;  Location: [facility];  Service: Orthopedics;  Laterality: Left;    Family History  Problem Relation Age of Onset   Stroke Sister        Died in her 61's   Stroke Father         Died age 37   Heart failure Mother        Died age 84   Stroke Mother    Social History:  reports that she has never smoked. She has never used smokeless tobacco. She reports that she does not drink alcohol and does not use drugs.  Allergies:  Allergies  Allergen Reactions   Penicillins Other (See Comments)    Pt can remember, happened 50 years ago Has patient had a PCN reaction causing immediate rash, facial/tongue/throat swelling, SOB or lightheadedness with hypotension: Unknown Has patient had a PCN reaction causing severe rash involving mucus membranes or skin necrosis: Unknown Has patient had a PCN reaction that required hospitalization:was inpatient at time of reaction Has patient had a PCN reaction occurring within the last 10 years: No If all of the above answers are "NO", then may proceed with C   Levofloxacin     Weakness    Zocor [Simvastatin]     UNSPECIFIED REACTION    Sulfa Antibiotics Nausea And Vomiting    Medications Prior to Admission  Medication Sig Dispense Refill   allopurinol (ZYLOPRIM) 100 MG tablet Take 100 mg by mouth daily as needed (gout).     ALPRAZolam (XANAX) 0.25 MG tablet Take 0.25 mg by mouth at bedtime.     aspirin EC 81 MG tablet Take 1 tablet (81 mg total) by mouth daily. 90 tablet 3   atenolol (TENORMIN) 25 MG tablet Take 1 tablet (  25 mg total) by mouth daily. 30 tablet 6   cholecalciferol (VITAMIN D) 1000 units tablet Take 2,000 Units by mouth daily.     diclofenac (VOLTAREN) 75 MG EC tablet Take 75 mg by mouth 2 (two) times daily.     famotidine (PEPCID) 20 MG tablet Take 20 mg by mouth 2 (two) times daily.     furosemide (LASIX) 40 MG tablet Take 40 mg by mouth daily.       isosorbide mononitrate (IMDUR) 30 MG 24 hr tablet TAKE 0.5 TABLETS (15 MG TOTAL) BY MOUTH DAILY. 45 tablet 3   Menthol, Topical Analgesic, (BIOFREEZE EX) Apply 1 application topically daily as needed (leg pain).     MITIGARE 0.6 MG CAPS Take 0.6 mg by mouth daily as  needed (gout).     Multiple Vitamins-Minerals (PRESERVISION AREDS 2 PO) Take 1 capsule by mouth daily.     nitroGLYCERIN (NITROSTAT) 0.4 MG SL tablet Place 1 tablet (0.4 mg total) under the tongue every 5 (five) minutes as needed for chest pain. 25 tablet 3   pantoprazole (PROTONIX) 40 MG tablet TAKE 1 TABLET BY MOUTH EVERY DAY 90 tablet 3   pramipexole (MIRAPEX) 0.25 MG tablet Take 0.5 mg by mouth at bedtime.     Probiotic Product (ALIGN) 4 MG CAPS Take 4 mg by mouth daily.     sertraline (ZOLOFT) 25 MG tablet Take 25 mg by mouth daily.      Results for orders placed or performed during the hospital encounter of 06/29/21 (from the past 48 hour(s))  Surgical pcr screen     Status: None   Collection Time: 06/29/21  1:27 PM   Specimen: Nasal Mucosa; Nasal Swab  Result Value Ref Range   MRSA, PCR NEGATIVE NEGATIVE   Staphylococcus aureus NEGATIVE NEGATIVE    Comment: (NOTE) The Xpert SA Assay (FDA approved for NASAL specimens in patients 53 years of age and older), is one component of a comprehensive surveillance program. It is not intended to diagnose infection nor to guide or monitor treatment. Performed at [facility], [place] [address], [place], [place] [address]   SARS CORONAVIRUS 2 (TAT 6-24 HRS) Nasopharyngeal Nasopharyngeal Swab     Status: None   Collection Time: 06/29/21  1:27 PM   Specimen: Nasopharyngeal Swab  Result Value Ref Range   SARS Coronavirus 2 NEGATIVE NEGATIVE    Comment: (NOTE) SARS-CoV-2 target nucleic acids are NOT DETECTED.  The SARS-CoV-2 RNA is generally detectable in upper and lower respiratory specimens during the acute phase of infection. Negative results do not preclude SARS-CoV-2 infection, do not rule out co-infections with other pathogens, and should not be used as the sole basis for treatment or other patient management decisions. Negative results must be combined with clinical observations, patient history, and epidemiological information.  The expected result is Negative.  Fact Sheet for Patients: SugarRoll.be  Fact Sheet for Healthcare Providers: https://www.woods-mathews.com/  This test is not yet approved or cleared by the [place] FDA and  has been authorized for detection and/or diagnosis of SARS-CoV-2 by FDA under an Emergency Use Authorization (EUA). This EUA will remain  in effect (meaning this test can be used) for the duration of the COVID-19 declaration under Se ction 564(b)(1) of the Act, 21 U.S.C. section 360bbb-3(b)(1), unless the authorization is terminated or revoked sooner.  Performed at [facility], [place] [address] [place], [place] [address]   Type and screen [place]     Status:  None   Collection Time: 06/29/21  1:49 PM  Result Value Ref Range   ABO/RH(D) A NEG    Antibody Screen NEG    Sample Expiration 07/13/2021,2359    Extend sample reason      NO TRANSFUSIONS OR PREGNANCY IN THE PAST 3 MONTHS Performed at [facility], [place] [address], [place], [place] [address]   Basic metabolic panel per protocol     Status: Abnormal   Collection Time: 06/29/21  2:35 PM  Result Value Ref Range   Sodium 138 135 - 145 mmol/L   Potassium 4.2 3.5 - 5.1 mmol/L   Chloride 105 98 - 111 mmol/L   CO2 23 22 - 32 mmol/L   Glucose, Bld 97 70 - 99 mg/dL    Comment: Glucose reference range applies only to samples taken after fasting for at least 8 hours.   BUN 48 (H) 8 - 23 mg/dL   Creatinine, Ser 1.77 (H) 0.44 - 1.00 mg/dL   Calcium 9.0 8.9 - 10.3 mg/dL   GFR, Estimated 28 (L) >60 mL/min    Comment: (NOTE) Calculated using the CKD-EPI Creatinine Equation (2021)    Anion gap 10 5 - 15    Comment: Performed at [facility] [address], [place], [place] [address]  CBC per protocol     Status: None   Collection Time: 06/29/21  2:35 PM  Result Value Ref Range   WBC 7.1 4.0 - 10.5 K/uL   RBC 4.20 3.87 - 5.11 MIL/uL    Hemoglobin 12.8 12.0 - 15.0 g/dL   HCT 41.0 36.0 - 46.0 %   MCV 97.6 80.0 - 100.0 fL   MCH 30.5 26.0 - 34.0 pg   MCHC 31.2 30.0 - 36.0 g/dL   RDW 14.2 11.5 - 15.5 %   Platelets 211 150 - 400 K/uL   nRBC 0.0 0.0 - 0.2 %    Comment: Performed at [facility], [place] [address], [place], [place] [address]   No results found.  Review of Systems  Musculoskeletal:  Positive for back pain.  Neurological:  Positive for numbness.   Blood pressure (!) 184/54, pulse (!) 57, temperature 97.6 F (36.4 C), temperature source Oral, resp. rate 17, height 5\' 6"  (1.676 m), weight 108.9 kg, SpO2 100 %. Physical Exam HENT:     Head: Normocephalic.     Right Ear: Tympanic membrane normal.     Nose: Nose normal.     Mouth/Throat:     Mouth: Mucous membranes are moist.  Cardiovascular:     Rate and Rhythm: Normal rate.  Pulmonary:     Effort: Pulmonary effort is normal.  Abdominal:     General: Abdomen is flat.  Musculoskeletal:        General: Normal range of motion.     Cervical back: Normal range of motion.  Neurological:     Mental Status: She is alert.     Comments: Patient is awake and alert strength is 5-5 iliopsoas, quads, hamstrings, gastroc, tibialis, and EHL.     Assessment/Plan 85 year old presents for anterior lateral interbody fusion L2-3 with posterior cortical screw augmentation.  Elaina Hoops, MD 07/01/2021, 8:17 AM

## 2021-07-01 NOTE — Anesthesia Procedure Notes (Signed)
Procedure Name: Intubation Date/Time: 07/01/2021 8:45 AM Performed by: Lance Coon, CRNA Pre-anesthesia Checklist: Patient identified, Emergency Drugs available, Suction available, Patient being monitored and Timeout performed Patient Re-evaluated:Patient Re-evaluated prior to induction Oxygen Delivery Method: Circle system utilized Preoxygenation: Pre-oxygenation with 100% oxygen Induction Type: IV induction, Rapid sequence and Cricoid Pressure applied Laryngoscope Size: Miller and 3 Grade View: Grade II Tube type: Oral Tube size: 7.0 mm Number of attempts: 1 Airway Equipment and Method: Stylet Placement Confirmation: ETT inserted through vocal cords under direct vision, positive ETCO2 and breath sounds checked- equal and bilateral Secured at: 21 cm Tube secured with: Tape Dental Injury: Teeth and Oropharynx as per pre-operative assessment

## 2021-07-02 LAB — CBC
HCT: 30.8 % — ABNORMAL LOW (ref 36.0–46.0)
Hemoglobin: 9.5 g/dL — ABNORMAL LOW (ref 12.0–15.0)
MCH: 30.7 pg (ref 26.0–34.0)
MCHC: 30.8 g/dL (ref 30.0–36.0)
MCV: 99.7 fL (ref 80.0–100.0)
Platelets: 178 10*3/uL (ref 150–400)
RBC: 3.09 MIL/uL — ABNORMAL LOW (ref 3.87–5.11)
RDW: 14.4 % (ref 11.5–15.5)
WBC: 9.1 10*3/uL (ref 4.0–10.5)
nRBC: 0 % (ref 0.0–0.2)

## 2021-07-02 MED ORDER — SODIUM CHLORIDE 0.9 % IV BOLUS
500.0000 mL | Freq: Once | INTRAVENOUS | Status: AC
  Administered 2021-07-02: 500 mL via INTRAVENOUS

## 2021-07-02 NOTE — Progress Notes (Signed)
Pt got up to use the restroom, while ambulating back to bed she became dizzy and light headed. Bp was 83/66 sitting in chair, got her back to bed and rechecked BP was 118/63. MD notified.

## 2021-07-02 NOTE — Evaluation (Signed)
Occupational Therapy Evaluation Patient Details Name: Renee Dudley MRN: [id number] DOB: 10/25/1935 Today's Date: 07/02/2021   History of Present Illness 85 y/o female s/p ALIF L2-3 on 10/19. PMH: GERD, HTN, anxiety, HLD, renal insufficiency, depression   Clinical Impression   PTA patient was living alone in a private residence and was independent with ADLs/IADLs without AD. Reports recent use of rollator secondary to pain in low back. Patient currently presents near baseline level of function demonstrating observed ADLs with supervision to [place] A grossly. OT provided education on spinal precautions, home set-up to maximize safety and independence with self-care tasks, and acquisition/use of AE. Patient expressed verbal understanding but wold benefit from further education. Patient reports having 5 children all of which can assist post d/c. OT will continue to follow acutely.      Recommendations for follow up therapy are one component of a multi-disciplinary discharge planning process, led by the attending physician.  Recommendations may be updated based on patient status, additional functional criteria and insurance authorization.   Follow Up Recommendations  No OT follow up;Supervision - Intermittent (Initial assist with ADLs/ADL transfers)    Equipment Recommendations  None recommended by OT;Other (comment) (Patient has necessary DME.)    Recommendations for Other Services       Precautions / Restrictions Precautions Precautions: Back;Fall Precaution Booklet Issued: Yes (comment) Precaution Comments: Able to recall 3/3 back precautions with use of handout given at time of PT eval. Required Braces or Orthoses: Spinal Brace Spinal Brace: Lumbar corset;Applied in sitting position Restrictions Weight Bearing Restrictions: No      Mobility Bed Mobility Overal bed mobility: Needs Assistance Bed Mobility: Rolling;Sidelying to Sit;Sit to Supine Rolling: Supervision Sidelying to  sit: Supervision;HOB elevated   Sit to supine: Min assist   General bed mobility comments: Supervision A for supine to EOB with cues for technique. Requires Min A to advance RLE from EOB to bed surface.    Transfers Overall transfer level: Needs assistance Equipment used: Rolling walker (2 wheeled) Transfers: Sit to/from Stand Sit to Stand: Supervision;Min guard         General transfer comment: Supervision A for sit to stand from EOB and from comfort height toilet with cues for hand placement.    Balance Overall balance assessment: Mild deficits observed, not formally tested                                         ADL either performed or assessed with clinical judgement   ADL Overall ADL's : Needs assistance/impaired Eating/Feeding: Independent   Grooming: Supervision/safety;Standing   Upper Body Bathing: Supervision/ safety;Sitting   Lower Body Bathing: Minimal assistance;Sit to/from stand   Upper Body Dressing : Set up;Sitting   Lower Body Dressing: Minimal assistance;Sit to/from stand   Toilet Transfer: Supervision/safety;Comfort height toilet Toilet Transfer Details (indicate cue type and reason): Commode in bathroom with use of grab bar. Toileting- Clothing Manipulation and Hygiene: Supervision/safety;Sit to/from stand Toileting - Clothing Manipulation Details (indicate cue type and reason): 3/3 parts of toileting task with supervision A.             Vision Baseline Vision/History: 1 Wears glasses Ability to See in Adequate Light: 0 Adequate Patient Visual Report: No change from baseline Vision Assessment?: No apparent visual deficits     Perception     Praxis      Pertinent Vitals/Pain Pain Assessment: Faces Faces Pain  Scale: Hurts little more Pain Location: back Pain Descriptors / Indicators: Discomfort;Grimacing Pain Intervention(s): Limited activity within patient's tolerance;Monitored during session;Premedicated before  session;Repositioned     Hand Dominance Right   Extremity/Trunk Assessment Upper Extremity Assessment Upper Extremity Assessment: Overall WFL for tasks assessed   Lower Extremity Assessment Lower Extremity Assessment: Generalized weakness   Cervical / Trunk Assessment Cervical / Trunk Assessment: Kyphotic   Communication Communication Communication: No difficulties   Cognition Arousal/Alertness: Awake/alert Behavior During Therapy: WFL for tasks assessed/performed Overall Cognitive Status: Within Functional Limits for tasks assessed                                     General Comments  Patient on RA upon entry. VSS on RA.    Exercises     Shoulder Instructions      Home Living Family/patient expects to be discharged to:: Private residence Living Arrangements: Alone Available Help at Discharge: Family;Available 24 hours/day Type of Home: House Home Access: Ramped entrance     Home Layout: One level     Bathroom Shower/Tub: Occupational psychologist: Handicapped height Bathroom Accessibility: No   Home Equipment: Environmental consultant - 2 wheels;Walker - 4 wheels;Shower seat - built in;Toilet riser          Prior Functioning/Environment Level of Independence: Independent        Comments: in past 2 months, patient has been utilizing RW for mobility due to pain.        OT Problem List: Decreased strength;Decreased activity tolerance;Impaired balance (sitting and/or standing)      OT Treatment/Interventions: Self-care/ADL training;Therapeutic exercise;Energy conservation;DME and/or AE instruction;Therapeutic activities;Patient/family education;Balance training    OT Goals(Current goals can be found in the care plan section) Acute Rehab OT Goals Patient Stated Goal: to go home OT Goal Formulation: With patient Time For Goal Achievement: 07/16/21 Potential to Achieve Goals: Good ADL Goals Pt Will Perform Grooming: with modified  independence;standing Pt Will Perform Lower Body Dressing: with supervision;with adaptive equipment Pt Will Transfer to Toilet: with supervision;ambulating Pt Will Perform Toileting - Clothing Manipulation and hygiene: with supervision;sit to/from stand  OT Frequency: Min 2X/week   Barriers to D/C:            Co-evaluation              AM-PAC OT "6 Clicks" Daily Activity     Outcome Measure Help from another person eating meals?: None Help from another person taking care of personal grooming?: A Little Help from another person toileting, which includes using toliet, bedpan, or urinal?: A Little Help from another person bathing (including washing, rinsing, drying)?: A Little Help from another person to put on and taking off regular upper body clothing?: A Little Help from another person to put on and taking off regular lower body clothing?: A Little 6 Click Score: 19   End of Session Equipment Utilized During Treatment: Cervical collar Nurse Communication: Mobility status  Activity Tolerance: Patient tolerated treatment well Patient left: in bed;with call bell/phone within reach;with bed alarm set  OT Visit Diagnosis: Unsteadiness on feet (R26.81);Muscle weakness (generalized) (M62.81)                Time: 7353-2992 OT Time Calculation (min): 15 min Charges:  OT General Charges $OT Visit: 1 Visit OT Evaluation $OT Eval Low Complexity: 1 Low  Lenka Zhao H. OTR/L Supplemental OT, Department of rehab services [phone]  [facility]  R H. 07/02/2021, 12:59 PM

## 2021-07-02 NOTE — Progress Notes (Signed)
Subjective: Patient reports mild back pain but slept great, feeling much better  Objective: Vital signs in last 24 hours: Temp:  [97 F (36.1 C)-98.6 F (37 C)] 98.2 F (36.8 C) (10/20 0743) Pulse Rate:  [44-66] 44 (10/20 0743) Resp:  [13-17] 16 (10/20 0743) BP: (94-144)/(36-70) 121/39 (10/20 0743) SpO2:  [92 %-100 %] 95 % (10/20 0743)  Intake/Output from previous day: 10/19 0701 - 10/20 0700 In: 2360 [P.O.:360; I.V.:2000] Out: 1205 [Urine:700; Drains:305; Blood:200] Intake/Output this shift: No intake/output data recorded.  Neurologic: Grossly normal  Lab Results: Lab Results  Component Value Date   WBC 7.1 06/29/2021   HGB 12.8 06/29/2021   HCT 41.0 06/29/2021   MCV 97.6 06/29/2021   PLT 211 06/29/2021   Lab Results  Component Value Date   INR 1.04 12/12/2014   BMET Lab Results  Component Value Date   NA 139 07/01/2021   K 4.5 07/01/2021   CL 105 07/01/2021   CO2 27 07/01/2021   GLUCOSE 103 (H) 07/01/2021   BUN 38 (H) 07/01/2021   CREATININE 1.44 (H) 07/01/2021   CALCIUM 9.2 07/01/2021    Studies/Results: DG Lumbar Spine 2-3 Views  Result Date: 07/01/2021 CLINICAL DATA:  Lumbar fusion EXAM: LUMBAR SPINE - 2-3 VIEW COMPARISON:  CT 04/24/2021 FINDINGS: Intraoperative images during L2-L3 posterior and interbody fusion, with pedicle screws and intervertebral disc spacer in place. IMPRESSION: Intraoperative images during L2-L3 posterior and interbody fusion, with pedicle screws and intervertebral disc spacer in place. Electronically Signed   By: Maurine Simmering M.D.   On: 07/01/2021 16:02   DG C-Arm 1-60 Min-No Report  Result Date: 07/01/2021 Fluoroscopy was utilized by the requesting physician.  No radiographic interpretation.   DG C-Arm 1-60 Min-No Report  Result Date: 07/01/2021 Fluoroscopy was utilized by the requesting physician.  No radiographic interpretation.   DG C-Arm 1-60 Min-No Report  Result Date: 07/01/2021 Fluoroscopy was utilized by the  requesting physician.  No radiographic interpretation.   DG C-Arm 1-60 Min-No Report  Result Date: 07/01/2021 Fluoroscopy was utilized by the requesting physician.  No radiographic interpretation.   DG C-Arm 1-60 Min-No Report  Result Date: 07/01/2021 Fluoroscopy was utilized by the requesting physician.  No radiographic interpretation.    Assessment/Plan: Postop day 1 lumbar fusion, therapies today and ambulate several times. Possible dc home tomorrow    LOS: 1 day    Renee Dudley Renee Dudley 07/02/2021, 7:56 AM

## 2021-07-02 NOTE — Evaluation (Signed)
Physical Therapy Evaluation Patient Details Name: Renee Dudley MRN: [id number] DOB: 1936-03-08 Today's Date: 07/02/2021  History of Present Illness  85 y/o female s/p ALIF L2-3 on 10/19. PMH: GERD, HTN, anxiety, HLD, renal insufficiency, depression  Clinical Impression  PTA, patient lives alone and reports independent. Reports recent use of rollator due to pain. Patient currently ambulating at supervision level with use of RW. Educated patient on back precautions and brace wear, patient verbalized and demonstrated understanding. Patient has good family support of her 5 children to assist at d/c. Patient will benefit from skilled PT services during acute stay to address listed deficits. Recommend HHPT at discharge to maximize functional independence and safety.      Recommendations for follow up therapy are one component of a multi-disciplinary discharge planning process, led by the attending physician.  Recommendations may be updated based on patient status, additional functional criteria and insurance authorization.  Follow Up Recommendations Home health PT;Supervision - Intermittent    Equipment Recommendations  Rolling Raegan Sipp with 5" wheels    Recommendations for Other Services       Precautions / Restrictions Precautions Precautions: Back;Fall Precaution Booklet Issued: Yes (comment) Required Braces or Orthoses: Spinal Brace Spinal Brace: Lumbar corset;Applied in sitting position Restrictions Weight Bearing Restrictions: No      Mobility  Bed Mobility Overal bed mobility: Needs Assistance Bed Mobility: Rolling;Sidelying to Sit Rolling: Supervision Sidelying to sit: Supervision;HOB elevated       General bed mobility comments: supervision for safety. Instructed on log roll technique    Transfers Overall transfer level: Needs assistance Equipment used: Rolling Tamzin Bertling (2 wheeled) Transfers: Sit to/from Stand Sit to Stand: Min guard         General transfer  comment: min guard for safety, cues for hand placement.  Ambulation/Gait Ambulation/Gait assistance: Supervision Gait Distance (Feet): 200 Feet Assistive device: Rolling Jaedyn Marrufo (2 wheeled) Gait Pattern/deviations: Step-through pattern;Decreased stride length;Trunk flexed Gait velocity: decreased   General Gait Details: trunk flexed throughout. Supervision for safety. Cues for upright posture.  Stairs            Wheelchair Mobility    Modified Rankin (Stroke Patients Only)       Balance Overall balance assessment: Mild deficits observed, not formally tested                                           Pertinent Vitals/Pain Pain Assessment: Faces Faces Pain Scale: Hurts little more Pain Location: back Pain Descriptors / Indicators: Discomfort;Grimacing Pain Intervention(s): Monitored during session    Home Living Family/patient expects to be discharged to:: Private residence Living Arrangements: Alone Available Help at Discharge: Family;Available 24 hours/day Type of Home: House Home Access: Ramped entrance     Home Layout: One level Home Equipment: Byren Pankow - 2 wheels;Dashana Guizar - 4 wheels;Shower seat - built in;Toilet riser      Prior Function Level of Independence: Independent         Comments: in past 2 months, patient has been utilizing RW for mobility due to pain.     Hand Dominance        Extremity/Trunk Assessment   Upper Extremity Assessment Upper Extremity Assessment: Overall WFL for tasks assessed    Lower Extremity Assessment Lower Extremity Assessment: Generalized weakness    Cervical / Trunk Assessment Cervical / Trunk Assessment: Kyphotic  Communication   Communication: No difficulties  Cognition Arousal/Alertness:  Awake/alert Behavior During Therapy: WFL for tasks assessed/performed Overall Cognitive Status: Within Functional Limits for tasks assessed                                         General Comments General comments (skin integrity, edema, etc.): on 2L O2 [place] on arrival, removed for mobility. On return to room, patient with reports of SOB, donned 2L O2 [place] for comfort due to inability to get accurate reading from pulse ox    Exercises     Assessment/Plan    PT Assessment Patient needs continued PT services  PT Problem List Decreased strength;Decreased activity tolerance;Decreased balance;Decreased mobility;Decreased knowledge of use of DME;Decreased knowledge of precautions       PT Treatment Interventions DME instruction;Gait training;Functional mobility training;Balance training;Therapeutic exercise;Therapeutic activities;Patient/family education    PT Goals (Current goals can be found in the Care Plan section)  Acute Rehab PT Goals Patient Stated Goal: to go home PT Goal Formulation: With patient Time For Goal Achievement: 07/16/21 Potential to Achieve Goals: Good    Frequency Min 5X/week   Barriers to discharge        Co-evaluation               AM-PAC PT "6 Clicks" Mobility  Outcome Measure Help needed turning from your back to your side while in a flat bed without using bedrails?: A Little Help needed moving from lying on your back to sitting on the side of a flat bed without using bedrails?: A Little Help needed moving to and from a bed to a chair (including a wheelchair)?: A Little Help needed standing up from a chair using your arms (e.g., wheelchair or bedside chair)?: A Little Help needed to walk in hospital room?: A Little Help needed climbing 3-5 steps with a railing? : A Lot 6 Click Score: 17    End of Session Equipment Utilized During Treatment: Gait belt;Back brace Activity Tolerance: Patient tolerated treatment well Patient left: in chair;with call bell/phone within reach Nurse Communication: Mobility status PT Visit Diagnosis: Unsteadiness on feet (R26.81);Muscle weakness (generalized) (M62.81)    Time: 1031-5945 PT Time  Calculation (min) (ACUTE ONLY): 35 min   Charges:   PT Evaluation $PT Eval Moderate Complexity: 1 Mod PT Treatments $Gait Training: 8-22 mins        Lander Eslick A. Gilford Rile PT, DPT Acute Rehabilitation Services Pager [phone] Office [phone]   Linna Hoff 07/02/2021, 10:28 AM

## 2021-07-03 MED ORDER — METHOCARBAMOL 500 MG PO TABS: 500.0000 mg | ORAL_TABLET | Freq: Four times a day (QID) | ORAL | 0 refills | Status: DC

## 2021-07-03 MED ORDER — OXYCODONE-ACETAMINOPHEN 7.5-325 MG PO TABS: 1.0000 | ORAL_TABLET | ORAL | 0 refills | Status: DC | PRN

## 2021-07-03 NOTE — Progress Notes (Signed)
Occupational Therapy Treatment Patient Details Name: DENESHIA ZUCKER MRN: [id number] DOB: 04-10-1936 Today's Date: 07/03/2021   History of present illness 85 y/o female s/p ALIF L2-3 on 10/19. PMH: GERD, HTN, anxiety, HLD, renal insufficiency, depression   OT comments  Pt will need (A) for LB dressing at home to sustain back precautions. Pt bending to do all LB dressing even with reacher. Pt states "this will take me forever this way" pt dressed for home and educated on family (A) needs. Pt eager to d/c home. Recommendation for [facility]    Recommendations for follow up therapy are one component of a multi-disciplinary discharge planning process, led by the attending physician.  Recommendations may be updated based on patient status, additional functional criteria and insurance authorization.    Follow Up Recommendations  Home health OT    Equipment Recommendations  3 in 1 bedside commode;Other (comment) (RW)    Recommendations for Other Services      Precautions / Restrictions Precautions Precautions: Back;Fall Required Braces or Orthoses: Spinal Brace Spinal Brace: Lumbar corset;Applied in sitting position       Mobility Bed Mobility Overal bed mobility: Needs Assistance Bed Mobility: Supine to Sit Rolling: Mod assist Sidelying to sit: Mod assist       General bed mobility comments: pt states "i cant do it that way honey" pt reports sleeping in her recliner x1 week prior to admission. pt has to elevate chair surface due to poor sit <>stand    Transfers Overall transfer level: Needs assistance Equipment used: Rolling walker (2 wheeled) Transfers: Sit to/from Stand Sit to Stand: Max assist         General transfer comment: from standard chair. pt with elevated surface min (A) pt with hip flexion and bending to complete task.    Balance Overall balance assessment: Mild deficits observed, not formally tested                                          ADL either performed or assessed with clinical judgement   ADL Overall ADL's : Needs assistance/impaired                 Upper Body Dressing : Min guard;Sitting   Lower Body Dressing: Minimal assistance;Sit to/from stand;With adaptive equipment Lower Body Dressing Details (indicate cue type and reason): pt needs cues throughout not to bend pt using reaching with poor return demo. pt reports will have children to hlep her too . Toilet Transfer: Designer, television/film set Details (indicate cue type and reason): pt attempting to walk with brief aroudn ankles and standing without (A) even after cues to let OT know when finished x2. Toileting- Clothing Manipulation and Hygiene: Moderate assistance;Sit to/from stand       Functional mobility during ADLs: Min guard;Rolling walker General ADL Comments: pt dressed for home and provided home brief. pt educated the need to have discharge orders and staff would notify family.     Vision       Perception     Praxis      Cognition Arousal/Alertness: Awake/alert Behavior During Therapy: WFL for tasks assessed/performed Overall Cognitive Status: Impaired/Different from baseline Area of Impairment: Memory;Awareness;Safety/judgement                     Memory: Decreased recall of precautions;Decreased short-term memory   Safety/Judgement: Decreased awareness of safety;Decreased awareness of deficits  Awareness: Emergent   General Comments: pt asking the same question 3 times during session. pt attempting to bend despite knowing precaution. pt states "this going to take me forever this way"        Exercises     Shoulder Instructions       General Comments dressing dry and intact at this time    Pertinent Vitals/ Pain       Pain Assessment: Faces Faces Pain Scale: Hurts even more Pain Location: back Pain Descriptors / Indicators: Discomfort;Grimacing Pain Intervention(s): Monitored during session;Premedicated  before session;Repositioned  Home Living                                          Prior Functioning/Environment              Frequency  Min 2X/week        Progress Toward Goals  OT Goals(current goals can now be found in the care plan section)  Progress towards OT goals: Progressing toward goals  Acute Rehab OT Goals Patient Stated Goal: to go home OT Goal Formulation: With patient Time For Goal Achievement: 07/16/21 Potential to Achieve Goals: Good ADL Goals Pt Will Perform Grooming: with modified independence;standing Pt Will Perform Lower Body Dressing: with supervision;with adaptive equipment Pt Will Transfer to Toilet: with supervision;ambulating Pt Will Perform Toileting - Clothing Manipulation and hygiene: with supervision;sit to/from stand  Plan Discharge plan needs to be updated    Co-evaluation                 AM-PAC OT "6 Clicks" Daily Activity     Outcome Measure   Help from another person eating meals?: None Help from another person taking care of personal grooming?: A Little Help from another person toileting, which includes using toliet, bedpan, or urinal?: A Little Help from another person bathing (including washing, rinsing, drying)?: A Little Help from another person to put on and taking off regular upper body clothing?: A Little Help from another person to put on and taking off regular lower body clothing?: A Lot 6 Click Score: 18    End of Session Equipment Utilized During Treatment: Rolling walker;Back brace  OT Visit Diagnosis: Unsteadiness on feet (R26.81);Muscle weakness (generalized) (M62.81)   Activity Tolerance Patient tolerated treatment well   Patient Left Other (comment) (starting PT session)   Nurse Communication Mobility status        Time: 1335-1405 OT Time Calculation (min): 30 min  Charges: OT General Charges $OT Visit: 1 Visit OT Treatments $Self Care/Home Management : 23-37  mins   Brynn, OTR/L  Acute Rehabilitation Services Pager: [phone] Office: [phone] .   Jeri Modena 07/03/2021, 4:09 PM

## 2021-07-03 NOTE — Progress Notes (Signed)
Physical Therapy Treatment Patient Details Name: Renee Dudley MRN: [id number] DOB: 1936/08/30 Today's Date: 07/03/2021   History of Present Illness 85 y/o female s/p ALIF L2-3 on 10/19. PMH: GERD, HTN, anxiety, HLD, renal insufficiency, depression    PT Comments    Received in recliner as handoff from OT. Patient reports fatigue following ADLs with OT. Patient required maxA to stand from recliner. Patient ambulating at supervision level with use of RW. Patient with trunk flexed and scoliosis type posture during ambulation. Unable to recall precautions and requires cues to recall. Recommend HHPT at discharge.     Recommendations for follow up therapy are one component of a multi-disciplinary discharge planning process, led by the attending physician.  Recommendations may be updated based on patient status, additional functional criteria and insurance authorization.  Follow Up Recommendations  Home health PT;Supervision - Intermittent     Equipment Recommendations  Rolling Jerauld Bostwick with 5" wheels    Recommendations for Other Services       Precautions / Restrictions Precautions Precautions: Back;Fall Required Braces or Orthoses: Spinal Brace Spinal Brace: Lumbar corset;Applied in sitting position Restrictions Weight Bearing Restrictions: No     Mobility  Bed Mobility Overal bed mobility: Needs Assistance Bed Mobility: Supine to Sit Rolling: Mod assist Sidelying to sit: Mod assist       General bed mobility comments: in recliner on arrival with OT    Transfers Overall transfer level: Needs assistance Equipment used: Rolling Kayle Passarelli (2 wheeled) Transfers: Sit to/from Stand Sit to Stand: Max assist         General transfer comment: from low recliner, maxA to stand  Ambulation/Gait Ambulation/Gait assistance: Supervision Gait Distance (Feet): 70 Feet Assistive device: Rolling Nilan Iddings (2 wheeled) Gait Pattern/deviations: Step-through pattern;Decreased stride  length;Trunk flexed Gait velocity: decreased   General Gait Details: trunk flexed throughout. Supervision for safety. Patient with scoliosis posture during ambulation   Stairs             Wheelchair Mobility    Modified Rankin (Stroke Patients Only)       Balance Overall balance assessment: Mild deficits observed, not formally tested                                          Cognition Arousal/Alertness: Awake/alert Behavior During Therapy: WFL for tasks assessed/performed Overall Cognitive Status: Impaired/Different from baseline Area of Impairment: Memory;Awareness;Safety/judgement                     Memory: Decreased recall of precautions;Decreased short-term memory   Safety/Judgement: Decreased awareness of safety;Decreased awareness of deficits Awareness: Emergent   General Comments: STM deficits noted this session. Unable to recall 3 precautions that OT previously went over in depth.      Exercises      General Comments General comments (skin integrity, edema, etc.): dressing dry and intact at this time      Pertinent Vitals/Pain Pain Assessment: Faces Faces Pain Scale: Hurts even more Pain Location: back Pain Descriptors / Indicators: Discomfort;Grimacing Pain Intervention(s): Limited activity within patient's tolerance;Monitored during session    Home Living                      Prior Function            PT Goals (current goals can now be found in the care plan section) Acute Rehab PT Goals  Patient Stated Goal: to go home PT Goal Formulation: With patient Time For Goal Achievement: 07/16/21 Potential to Achieve Goals: Good Progress towards PT goals: Progressing toward goals    Frequency    Min 5X/week      PT Plan Current plan remains appropriate    Co-evaluation              AM-PAC PT "6 Clicks" Mobility   Outcome Measure  Help needed turning from your back to your side while in a flat  bed without using bedrails?: A Little Help needed moving from lying on your back to sitting on the side of a flat bed without using bedrails?: A Little Help needed moving to and from a bed to a chair (including a wheelchair)?: A Little Help needed standing up from a chair using your arms (e.g., wheelchair or bedside chair)?: A Little Help needed to walk in hospital room?: A Little Help needed climbing 3-5 steps with a railing? : A Lot 6 Click Score: 17    End of Session Equipment Utilized During Treatment: Gait belt;Back brace Activity Tolerance: Patient tolerated treatment well Patient left: in chair;with call bell/phone within reach Nurse Communication: Mobility status PT Visit Diagnosis: Unsteadiness on feet (R26.81);Muscle weakness (generalized) (M62.81)     Time: 8478-4128 PT Time Calculation (min) (ACUTE ONLY): 15 min  Charges:  $Gait Training: 8-22 mins                     Tammatha Cobb A. Gilford Rile PT, DPT Acute Rehabilitation Services Pager [phone] Office [phone]    Linna Hoff 07/03/2021, 4:53 PM

## 2021-07-03 NOTE — Care Management Important Message (Signed)
Important Message  Patient Details  Name: Renee Dudley MRN: [id number] Date of Birth: Apr 10, 1936   Medicare Important Message Given:  Yes     Orbie Pyo 07/03/2021, 3:30 PM

## 2021-07-03 NOTE — Discharge Summary (Signed)
Physician Discharge Summary  Patient ID: Renee Dudley MRN: [id number] DOB/AGE: 1936-02-26 85 y.o.  Admit date: 07/01/2021 Discharge date: 07/03/2021  Admission Diagnoses:  Degenerative disc disease grade 1 spondylolisthesis and rotary scoliosis L2-3    Discharge Diagnoses: same   Discharged Condition: good  Hospital Course: The patient was admitted on 07/01/2021 and taken to the operating room where the patient underwent Xlif L2-3 with posterior fixation. The patient tolerated the procedure well and was taken to the recovery room and then to the floor in stable condition. The hospital course was routine. There were no complications. The wound remained clean dry and intact. Pt had appropriate back soreness. No complaints of leg pain or new N/T/W. The patient remained afebrile with stable vital signs, and tolerated a regular diet. The patient continued to increase activities, and pain was well controlled with oral pain medications.   Consults: None  Significant Diagnostic Studies:  Results for orders placed or performed during the hospital encounter of 09/47/09  Basic metabolic panel  Result Value Ref Range   Sodium 139 135 - 145 mmol/L   Potassium 4.5 3.5 - 5.1 mmol/L   Chloride 105 98 - 111 mmol/L   CO2 27 22 - 32 mmol/L   Glucose, Bld 103 (H) 70 - 99 mg/dL   BUN 38 (H) 8 - 23 mg/dL   Creatinine, Ser 1.44 (H) 0.44 - 1.00 mg/dL   Calcium 9.2 8.9 - 10.3 mg/dL   GFR, Estimated 36 (L) >60 mL/min   Anion gap 7 5 - 15  Hemoglobin A1c  Result Value Ref Range   Hgb A1c MFr Bld 5.5 4.8 - 5.6 %   Mean Plasma Glucose 111.15 mg/dL  Glucose, capillary  Result Value Ref Range   Glucose-Capillary 142 (H) 70 - 99 mg/dL  CBC  Result Value Ref Range   WBC 9.1 4.0 - 10.5 K/uL   RBC 3.09 (L) 3.87 - 5.11 MIL/uL   Hemoglobin 9.5 (L) 12.0 - 15.0 g/dL   HCT 30.8 (L) 36.0 - 46.0 %   MCV 99.7 80.0 - 100.0 fL   MCH 30.7 26.0 - 34.0 pg   MCHC 30.8 30.0 - 36.0 g/dL   RDW 14.4 11.5 - 15.5 %    Platelets 178 150 - 400 K/uL   nRBC 0.0 0.0 - 0.2 %    DG Lumbar Spine 2-3 Views  Result Date: 07/01/2021 CLINICAL DATA:  Lumbar fusion EXAM: LUMBAR SPINE - 2-3 VIEW COMPARISON:  CT 04/24/2021 FINDINGS: Intraoperative images during L2-L3 posterior and interbody fusion, with pedicle screws and intervertebral disc spacer in place. IMPRESSION: Intraoperative images during L2-L3 posterior and interbody fusion, with pedicle screws and intervertebral disc spacer in place. Electronically Signed   By: Maurine Simmering M.D.   On: 07/01/2021 16:02   DG C-Arm 1-60 Min-No Report  Result Date: 07/01/2021 Fluoroscopy was utilized by the requesting physician.  No radiographic interpretation.   DG C-Arm 1-60 Min-No Report  Result Date: 07/01/2021 Fluoroscopy was utilized by the requesting physician.  No radiographic interpretation.   DG C-Arm 1-60 Min-No Report  Result Date: 07/01/2021 Fluoroscopy was utilized by the requesting physician.  No radiographic interpretation.   DG C-Arm 1-60 Min-No Report  Result Date: 07/01/2021 Fluoroscopy was utilized by the requesting physician.  No radiographic interpretation.   DG C-Arm 1-60 Min-No Report  Result Date: 07/01/2021 Fluoroscopy was utilized by the requesting physician.  No radiographic interpretation.    Antibiotics:  Anti-infectives (From admission, onward)    Start  Dose/Rate Route Frequency Ordered Stop   07/02/21 0800  vancomycin (VANCOCIN) IVPB 1000 mg/200 mL premix        1,000 mg 200 mL/hr over 60 Minutes Intravenous Every 24 hours 07/01/21 1521     07/01/21 0700  vancomycin (VANCOREADY) IVPB 1500 mg/300 mL        1,500 mg 150 mL/hr over 120 Minutes Intravenous On call to O.R. 07/01/21 0654 07/01/21 0952       Discharge Exam: Blood pressure (!) 121/55, pulse 64, temperature 98.5 F (36.9 C), temperature source Oral, resp. rate 18, height 5\' 6"  (1.676 m), weight 108.9 kg, SpO2 96 %. Neurologic: Grossly normal Ambulating and  voiding well, incision cdi   Discharge Medications:   Allergies as of 07/03/2021       Reactions   Penicillins Other (See Comments)   Pt can remember, happened 50 years ago Has patient had a PCN reaction causing immediate rash, facial/tongue/throat swelling, SOB or lightheadedness with hypotension: Unknown Has patient had a PCN reaction causing severe rash involving mucus membranes or skin necrosis: Unknown Has patient had a PCN reaction that required hospitalization:was inpatient at time of reaction Has patient had a PCN reaction occurring within the last 10 years: No If all of the above answers are "NO", then may proceed with C   Levofloxacin    Weakness    Zocor [simvastatin]    UNSPECIFIED REACTION    Sulfa Antibiotics Nausea And Vomiting        Medication List     TAKE these medications    Align 4 MG Caps Take 4 mg by mouth daily.   allopurinol 100 MG tablet Commonly known as: ZYLOPRIM Take 100 mg by mouth daily as needed (gout).   ALPRAZolam 0.25 MG tablet Commonly known as: XANAX Take 0.25 mg by mouth at bedtime.   aspirin EC 81 MG tablet Take 1 tablet (81 mg total) by mouth daily.   atenolol 25 MG tablet Commonly known as: TENORMIN Take 1 tablet (25 mg total) by mouth daily.   BIOFREEZE EX Apply 1 application topically daily as needed (leg pain).   cholecalciferol 1000 units tablet Commonly known as: VITAMIN D Take 2,000 Units by mouth daily.   diclofenac 75 MG EC tablet Commonly known as: VOLTAREN Take 75 mg by mouth 2 (two) times daily.   famotidine 20 MG tablet Commonly known as: PEPCID Take 20 mg by mouth 2 (two) times daily.   furosemide 40 MG tablet Commonly known as: LASIX Take 40 mg by mouth daily.   isosorbide mononitrate 30 MG 24 hr tablet Commonly known as: IMDUR TAKE 0.5 TABLETS (15 MG TOTAL) BY MOUTH DAILY.   methocarbamol 500 MG tablet Commonly known as: Robaxin Take 1 tablet (500 mg total) by mouth 4 (four) times daily.    Mitigare 0.6 MG Caps Generic drug: Colchicine Take 0.6 mg by mouth daily as needed (gout).   nitroGLYCERIN 0.4 MG SL tablet Commonly known as: NITROSTAT Place 1 tablet (0.4 mg total) under the tongue every 5 (five) minutes as needed for chest pain.   oxyCODONE-acetaminophen 7.5-325 MG tablet Commonly known as: Percocet Take 1 tablet by mouth every 4 (four) hours as needed for severe pain.   pantoprazole 40 MG tablet Commonly known as: PROTONIX TAKE 1 TABLET BY MOUTH EVERY DAY   pramipexole 0.25 MG tablet Commonly known as: MIRAPEX Take 0.5 mg by mouth at bedtime.   PRESERVISION AREDS 2 PO Take 1 capsule by mouth daily.   sertraline 25  MG tablet Commonly known as: ZOLOFT Take 25 mg by mouth daily.        Disposition: home   Final Dx: XLIF with posterior fixation L2-3       Signed: Ocie Cornfield Teleshia Lemere 07/03/2021, 12:44 PM

## 2021-07-03 NOTE — TOC Transition Note (Addendum)
Transition of Care (TOC) - CM/SW Discharge Note Marvetta Gibbons RN,BSN Transitions of Care Unit 4NP (Non Trauma)- RN Case Manager See Treatment Team for direct Phone #    Patient Details  Name: Renee Dudley MRN: [id number] Date of Birth: 01-20-1936  Transition of Care [facility]) CM/SW Contact:  Dawayne Patricia, RN Phone Number: 07/03/2021, 3:18 PM   Clinical Narrative:    Pt stable for transition home today, HHPT order placed, CM in to speak with pt regarding [place] needs, Per pt she has needed DME at home, will have transport home once d/c order placed- pt states "I'm bored and ready to get home"-  Discussed HHPT needs- pt agreeable to therapy at home-list provided to pt for choice Per CMS guidelines from medicare.gov website with star ratings (copy placed in shadow chart) - after review of list pt has selected [facility] as first choice- if unable to accept referral then pt states she does not have a preference and will defer to this writer to secure an agency on her behalf.   Address, phone # and PCP all confirmed in epic.   Call made to [facility] with [facility]- referral pending confirmation of staffing in pt's area.  78- heard back from [place] with [facility] and they are unable to staff for [place]- so unable to accept referral.  Call made to [facility] with [place] to see if they can staff for [place]- referral has been accepted- and they will reach out to pt to schedule HHPT visits.    Final next level of care: [place] Barriers to Discharge: No Barriers Identified   Patient Goals and CMS Choice Patient states their goals for this hospitalization and ongoing recovery are:: ready to go home CMS Medicare.gov Compare Post Acute Care list provided to:: Patient Choice offered to / list presented to : Patient  Discharge Placement                 Home w/ [facility]      Discharge Plan and Services   Discharge Planning Services: CM Consult Post Acute Care Choice:  Home Health                    HH Arranged: PT [place]: [place] Date [place]: 07/03/21 Time [place]: [id number] Representative spoke with at [place]: [place] Determinants of Health ([place]) Interventions     Readmission Risk Interventions Readmission Risk Prevention Plan 07/03/2021  Medication Screening Complete  Transportation Screening Complete  Some recent data might be hidden

## 2021-07-17 ENCOUNTER — Encounter (HOSPITAL_COMMUNITY): Payer: Self-pay | Admitting: Neurosurgery

## 2021-08-14 ENCOUNTER — Encounter (HOSPITAL_COMMUNITY): Payer: Self-pay | Admitting: Neurosurgery

## 2021-08-20 ENCOUNTER — Other Ambulatory Visit: Payer: Self-pay | Admitting: Cardiology

## 2021-08-28 ENCOUNTER — Other Ambulatory Visit: Payer: Self-pay | Admitting: Cardiology

## 2021-11-12 ENCOUNTER — Other Ambulatory Visit: Payer: Self-pay | Admitting: Cardiology

## 2021-12-10 ENCOUNTER — Ambulatory Visit: Payer: Medicare HMO | Admitting: Cardiology

## 2021-12-10 ENCOUNTER — Encounter: Payer: Self-pay | Admitting: Cardiology

## 2021-12-10 VITALS — BP 114/72 | HR 66 | Ht 66.0 in | Wt 244.2 lb

## 2021-12-10 DIAGNOSIS — I10 Essential (primary) hypertension: Secondary | ICD-10-CM | POA: Diagnosis not present

## 2021-12-10 DIAGNOSIS — I25119 Atherosclerotic heart disease of native coronary artery with unspecified angina pectoris: Secondary | ICD-10-CM

## 2021-12-10 NOTE — Patient Instructions (Addendum)

## 2021-12-10 NOTE — Progress Notes (Signed)
? ? ?Cardiology Office Note ? ?Date: 12/10/2021  ? ?ID: Renee Dudley, DOB [birth year], MRN [id number] ? ?PCP:  Renee Hilding, MD  ?Cardiologist:  Renee Lesches, MD ?Electrophysiologist:  None  ? ?Chief Complaint  ?Patient presents with  ? Cardiac follow-up  ? ? ?History of Present Illness: ?Renee Dudley is an 86 y.o. female last seen in September 2022.  She is here for a routine visit.  She has not had any significant angina symptoms on current medical regimen.  Mainly limited by lower back and right leg pain/weakness.  She has had some falls recently as well.  She underwent lumbar surgery in the interim with Dr. Saintclair Dudley and plans to follow-up. ? ?I reviewed her medications which are stable from a cardiac perspective and outlined below. ? ?Past Medical History:  ?Diagnosis Date  ? Anxiety   ? Chronic headaches   ? Coronary atherosclerosis of native coronary artery   ? Nonobstructive at cardiac catheterization 2007   ? Depression   ? DJD (degenerative joint disease)   ? Essential hypertension   ? GERD (gastroesophageal reflux disease)   ? History of gout   ? Mixed hyperlipidemia   ? Peripheral edema   ? Primary localized osteoarthritis of left knee   ? Renal insufficiency   ? Varicose veins   ? ? ?Past Surgical History:  ?Procedure Laterality Date  ? ANTERIOR LAT LUMBAR FUSION N/A 07/01/2021  ? Procedure: Anterior lateral lumbar fusion - Lateral two-lateral three - Interbody Fusion;  Surgeon: Renee Kos, MD;  Location: [place];  Service: Neurosurgery;  Laterality: N/A;  ? APPENDECTOMY    ? Cataract surgery Bilateral   ? CHOLECYSTECTOMY  1986  ? COLONOSCOPY    ? LUMBAR LAMINECTOMY/DECOMPRESSION MICRODISCECTOMY Right 02/23/2017  ? Procedure: Right Lumbar Two-Three Microdiscectomy;  Surgeon: Renee Kos, MD;  Location: [place];  Service: Neurosurgery;  Laterality: Right;  ? LUMBAR PERCUTANEOUS PEDICLE SCREW 1 LEVEL N/A 07/01/2021  ? Procedure: LUMBAR PERCUTANEOUS PEDICLE SCREW 1 LEVEL;  Surgeon: Renee Kos, MD;   Location: [place];  Service: Neurosurgery;  Laterality: N/A;  ? Right total knee arthroplasty  2011  ? TOTAL ABDOMINAL HYSTERECTOMY W/ BILATERAL SALPINGOOPHORECTOMY  1988  ? TOTAL KNEE ARTHROPLASTY Left 12/23/2014  ? Procedure: LEFT TOTAL KNEE ARTHROPLASTY;  Surgeon: Renee Saas, MD;  Location: [place];  Service: Orthopedics;  Laterality: Left;  ? ? ?Current Outpatient Medications  ?Medication Sig Dispense Refill  ? allopurinol (ZYLOPRIM) 100 MG tablet Take 100 mg by mouth daily as needed (gout).    ? ALPRAZolam (XANAX) 0.25 MG tablet Take 0.25 mg by mouth at bedtime.    ? aspirin EC 81 MG tablet Take 1 tablet (81 mg total) by mouth daily. 90 tablet 3  ? atenolol (TENORMIN) 25 MG tablet TAKE 1 TABLET (25 MG TOTAL) BY MOUTH DAILY. 90 tablet 1  ? cholecalciferol (VITAMIN D) 1000 units tablet Take 2,000 Units by mouth daily.    ? diclofenac (VOLTAREN) 75 MG EC tablet Take 75 mg by mouth 2 (two) times daily.    ? famotidine (PEPCID) 20 MG tablet Take 20 mg by mouth 2 (two) times daily.    ? furosemide (LASIX) 40 MG tablet Take 40 mg by mouth daily.      ? isosorbide mononitrate (IMDUR) 30 MG 24 hr tablet TAKE 1/2 OF A TABLET (15 MG TOTAL) BY MOUTH DAILY 45 tablet 3  ? Menthol, Topical Analgesic, (BIOFREEZE EX) Apply 1 application topically daily as needed (leg pain).    ?  methocarbamol (ROBAXIN) 500 MG tablet Take 1 tablet (500 mg total) by mouth 4 (four) times daily. 45 tablet 0  ? MITIGARE 0.6 MG CAPS Take 0.6 mg by mouth daily as needed (gout).    ? Multiple Vitamins-Minerals (PRESERVISION AREDS 2 PO) Take 1 capsule by mouth daily.    ? nitroGLYCERIN (NITROSTAT) 0.4 MG SL tablet Place 1 tablet (0.4 mg total) under the tongue every 5 (five) minutes as needed for chest pain. 25 tablet 3  ? pantoprazole (PROTONIX) 40 MG tablet TAKE 1 TABLET BY MOUTH EVERY DAY 90 tablet 3  ? pramipexole (MIRAPEX) 0.25 MG tablet Take 0.5 mg by mouth at bedtime.    ? Probiotic Product (ALIGN) 4 MG CAPS Take 4 mg by mouth daily.    ?  sertraline (ZOLOFT) 25 MG tablet Take 25 mg by mouth daily.    ? traMADol (ULTRAM) 50 MG tablet Take 50 mg by mouth 3 (three) times daily as needed.    ? oxyCODONE-acetaminophen (PERCOCET) 7.5-325 MG tablet Take 1 tablet by mouth every 4 (four) hours as needed for severe pain. (Patient not taking: Reported on 12/10/2021) 20 tablet 0  ? ?No current facility-administered medications for this visit.  ? ?Allergies:  Penicillins, Levofloxacin, Zocor [simvastatin], and Sulfa antibiotics  ? ?ROS: No palpitations or syncope. ? ?Physical Exam: ?VS:  BP 114/72   Pulse 66   Ht '5\' 6"'$  (1.676 m)   Wt 244 lb 3.2 oz (110.8 kg)   SpO2 97%   BMI 39.41 kg/m? , BMI Body mass index is 39.41 kg/m?. ? ?Wt Readings from Last 3 Encounters:  ?12/10/21 244 lb 3.2 oz (110.8 kg)  ?07/01/21 240 lb (108.9 kg)  ?06/29/21 240 lb 3.2 oz (109 kg)  ?  ?General: Patient appears comfortable at rest. ?HEENT: Conjunctiva and lids normal. ?Neck: Supple, no elevated JVP or carotid bruits, no thyromegaly. ?Lungs: Clear to auscultation, nonlabored breathing at rest. ?Cardiac: Regular rate and rhythm, no S3 or significant systolic murmur. ?Extremities: No pitting edema. ? ?ECG:  An ECG dated 06/01/2021 was personally reviewed today and demonstrated:  Sinus bradycardia with poor R wave progression rule out old anterolateral infarct pattern. ? ?Recent Labwork: ?07/01/2021: BUN 38; Creatinine, Ser 1.44; Potassium 4.5; Sodium 139 ?07/02/2021: Hemoglobin 9.5; Platelets 178  ? ?Other Studies Reviewed Today: ? ?Lexiscan Myoview 03/19/2020: ?No diagnostic ST segment changes to indicate ischemia. ?Small, mild intensity, apical lateral and basal septal defects that are fixed, more prominent on rest imaging in the septal distribution, and consistent with soft tissue attenuation. No large ischemic territories noted. ?This is a low risk study. ?Nuclear stress EF: 72%. ? ?Assessment and Plan: ? ?1.  Nonobstructive CAD by history with low risk Myoview as of 2021.  She does  not report any active angina at this time.  Continue observation on medical therapy which includes aspirin, Imdur, atenolol, and as needed nitroglycerin.  She has a history of statin intolerance. ? ?2.  Essential hypertension, blood pressure well controlled and no changes made today. ? ?Medication Adjustments/Labs and Tests Ordered: ?Current medicines are reviewed at length with the patient today.  Concerns regarding medicines are outlined above.  ? ?Tests Ordered: ?No orders of the defined types were placed in this encounter. ? ? ?Medication Changes: ?No orders of the defined types were placed in this encounter. ? ? ?Disposition:  Follow up  6 months. ? ?Signed, ?Satira Sark, MD, [facility] ?12/10/2021 2:48 PM    ?[place] [facility] ?[address]  [address] [place], [place] [address] ?Phone: [phone]; Fax: [phone]  ?

## 2022-01-13 ENCOUNTER — Other Ambulatory Visit (HOSPITAL_COMMUNITY): Payer: Self-pay | Admitting: Neurosurgery

## 2022-01-13 ENCOUNTER — Other Ambulatory Visit: Payer: Self-pay | Admitting: Neurosurgery

## 2022-01-13 DIAGNOSIS — M25551 Pain in right hip: Secondary | ICD-10-CM

## 2022-01-13 DIAGNOSIS — M48062 Spinal stenosis, lumbar region with neurogenic claudication: Secondary | ICD-10-CM

## 2022-01-25 ENCOUNTER — Ambulatory Visit: Payer: Medicare HMO | Admitting: Orthopaedic Surgery

## 2022-02-26 ENCOUNTER — Ambulatory Visit (HOSPITAL_COMMUNITY)
Admission: RE | Admit: 2022-02-26 | Discharge: 2022-02-26 | Disposition: A | Discharge status: Home / Self Care | Payer: Medicare HMO | Source: Ambulatory Visit | Attending: Neurosurgery | Admitting: Neurosurgery

## 2022-02-26 DIAGNOSIS — M25551 Pain in right hip: Secondary | ICD-10-CM | POA: Diagnosis present

## 2022-02-26 DIAGNOSIS — M48062 Spinal stenosis, lumbar region with neurogenic claudication: Secondary | ICD-10-CM | POA: Diagnosis present

## 2022-03-05 ENCOUNTER — Other Ambulatory Visit: Payer: Self-pay | Admitting: Cardiology

## 2022-03-08 ENCOUNTER — Ambulatory Visit (INDEPENDENT_AMBULATORY_CARE_PROVIDER_SITE_OTHER): Payer: Medicare HMO | Admitting: Orthopaedic Surgery

## 2022-03-08 ENCOUNTER — Ambulatory Visit (INDEPENDENT_AMBULATORY_CARE_PROVIDER_SITE_OTHER): Payer: Medicare HMO

## 2022-03-08 VITALS — Wt 244.0 lb

## 2022-03-08 DIAGNOSIS — M25551 Pain in right hip: Secondary | ICD-10-CM | POA: Diagnosis not present

## 2022-03-08 DIAGNOSIS — M25511 Pain in right shoulder: Secondary | ICD-10-CM | POA: Diagnosis not present

## 2022-03-08 DIAGNOSIS — G8929 Other chronic pain: Secondary | ICD-10-CM

## 2022-06-03 ENCOUNTER — Encounter: Payer: Self-pay | Admitting: Cardiology

## 2022-06-03 ENCOUNTER — Ambulatory Visit: Payer: Medicare HMO | Attending: Cardiology | Admitting: Cardiology

## 2022-06-03 VITALS — BP 138/80 | HR 63 | Ht 65.0 in | Wt 242.8 lb

## 2022-06-03 DIAGNOSIS — E782 Mixed hyperlipidemia: Secondary | ICD-10-CM | POA: Diagnosis not present

## 2022-06-03 DIAGNOSIS — I25119 Atherosclerotic heart disease of native coronary artery with unspecified angina pectoris: Secondary | ICD-10-CM

## 2022-06-03 DIAGNOSIS — I10 Essential (primary) hypertension: Secondary | ICD-10-CM | POA: Diagnosis not present

## 2022-06-03 DIAGNOSIS — Z79899 Other long term (current) drug therapy: Secondary | ICD-10-CM | POA: Diagnosis not present

## 2022-06-03 MED ORDER — ROSUVASTATIN CALCIUM 5 MG PO TABS: 5.0000 mg | ORAL_TABLET | Freq: Every day | ORAL | 3 refills | Status: DC

## 2022-06-03 NOTE — Progress Notes (Signed)
Cardiology Office Note  Date: 06/03/2022   ID: Renee Dudley, DOB 1935-11-11, MRN [id number]  PCP:  Manon Hilding, MD  Cardiologist:  [name], MD Electrophysiologist:  None   Chief Complaint  Patient presents with   Cardiac follow-up    History of Present Illness: Renee Dudley is an 86 y.o. female last seen in March.  She is here for a follow-up visit.  Reports no angina symptoms or breathlessness beyond NYHA class II.  She is limited by significant lower back pain, unable to stand for long period of time, using a cane or walker.  She does have a pain control injection scheduled for next week.  Has also had trouble with limited range of motion in her right shoulder.  I did review her recent lab work from Dr. Quintin Alto as noted below.  LDL 108.  She does have a prior history of myalgias on Zocor.  With vascular disease we discussed trial of a different statin.  I personally reviewed her ECG which shows sinus bradycardia with low voltage.  Past Medical History:  Diagnosis Date   Anxiety    Chronic headaches    Coronary atherosclerosis of native coronary artery    Nonobstructive at cardiac catheterization 2007    Depression    DJD (degenerative joint disease)    Essential hypertension    GERD (gastroesophageal reflux disease)    History of gout    Mixed hyperlipidemia    Peripheral edema    Primary localized osteoarthritis of left knee    Renal insufficiency    Varicose veins     Past Surgical History:  Procedure Laterality Date   ANTERIOR LAT LUMBAR FUSION N/A 07/01/2021   Procedure: Anterior lateral lumbar fusion - Lateral two-lateral three - Interbody Fusion;  Surgeon: Kary Kos, MD;  Location: [place];  Service: Neurosurgery;  Laterality: N/A;   APPENDECTOMY     Cataract surgery Bilateral    CHOLECYSTECTOMY  1986   COLONOSCOPY     LUMBAR LAMINECTOMY/DECOMPRESSION MICRODISCECTOMY Right 02/23/2017   Procedure: Right Lumbar Two-Three Microdiscectomy;   Surgeon: Kary Kos, MD;  Location: [place];  Service: Neurosurgery;  Laterality: Right;   LUMBAR PERCUTANEOUS PEDICLE SCREW 1 LEVEL N/A 07/01/2021   Procedure: LUMBAR PERCUTANEOUS PEDICLE SCREW 1 LEVEL;  Surgeon: Kary Kos, MD;  Location: [place];  Service: Neurosurgery;  Laterality: N/A;   Right total knee arthroplasty  2011   TOTAL ABDOMINAL HYSTERECTOMY W/ BILATERAL SALPINGOOPHORECTOMY  1988   TOTAL KNEE ARTHROPLASTY Left 12/23/2014   Procedure: LEFT TOTAL KNEE ARTHROPLASTY;  Surgeon: Elsie Saas, MD;  Location: [place];  Service: Orthopedics;  Laterality: Left;    Current Outpatient Medications  Medication Sig Dispense Refill   ALPRAZolam (XANAX) 0.25 MG tablet Take 0.25 mg by mouth at bedtime.     aspirin EC 81 MG tablet Take 1 tablet (81 mg total) by mouth daily. 90 tablet 3   atenolol (TENORMIN) 25 MG tablet TAKE 1 TABLET (25 MG TOTAL) BY MOUTH DAILY. 90 tablet 1   famotidine (PEPCID) 20 MG tablet Take 20 mg by mouth 2 (two) times daily.     furosemide (LASIX) 40 MG tablet Take 40 mg by mouth daily.       isosorbide mononitrate (IMDUR) 30 MG 24 hr tablet TAKE 1/2 OF A TABLET (15 MG TOTAL) BY MOUTH DAILY 45 tablet 3   Menthol, Topical Analgesic, (BIOFREEZE EX) Apply 1 application topically daily as needed (leg pain).     Multiple Vitamins-Minerals (  PRESERVISION AREDS 2 PO) Take 1 capsule by mouth daily.     nitroGLYCERIN (NITROSTAT) 0.4 MG SL tablet Place 1 tablet (0.4 mg total) under the tongue every 5 (five) minutes as needed for chest pain. 25 tablet 3   pantoprazole (PROTONIX) 40 MG tablet TAKE 1 TABLET BY MOUTH EVERY DAY 90 tablet 3   Probiotic Product (ALIGN) 4 MG CAPS Take 4 mg by mouth daily.     rosuvastatin (CRESTOR) 5 MG tablet Take 1 tablet (5 mg total) by mouth daily. 90 tablet 3   sertraline (ZOLOFT) 25 MG tablet Take 25 mg by mouth daily.     traMADol (ULTRAM) 50 MG tablet Take 50 mg by mouth 3 (three) times daily as needed.     No current facility-administered medications  for this visit.   Allergies:  Penicillins, Levofloxacin, Prednisone, Zocor [simvastatin], and Sulfa antibiotics   ROS: No palpitations or syncope.  Physical Exam: VS:  BP 138/80 (BP Location: Left Wrist, Patient Position: Sitting, Cuff Size: Normal)   Pulse 63   Ht '5\' 5"'$  (1.651 m)   Wt 242 lb 12.8 oz (110.1 kg)   SpO2 95%   BMI 40.40 kg/m , BMI Body mass index is 40.4 kg/m.  Wt Readings from Last 3 Encounters:  06/03/22 242 lb 12.8 oz (110.1 kg)  03/08/22 244 lb (110.7 kg)  12/10/21 244 lb 3.2 oz (110.8 kg)    General: Patient appears comfortable at rest. HEENT: Conjunctiva and lids normal. Neck: Supple, no elevated JVP or carotid bruits, no thyromegaly. Lungs: Clear to auscultation, nonlabored breathing at rest. Cardiac: Regular rate and rhythm, no S3 or significant systolic murmur. Extremities: No pitting edema.  ECG:  An ECG dated 06/01/2021 was personally reviewed today and demonstrated:  Sinus bradycardia with poor R wave progression, rule out old anterolateral infarct pattern.  Recent Labwork: 07/01/2021: BUN 38; Creatinine, Ser 1.44; Potassium 4.5; Sodium 139 07/02/2021: Hemoglobin 9.5; Platelets 178  September 2023: Potassium 4.2, BUN 25, creatinine 1.02, AST 9, ALT 7, cholesterol 194, TG 86, HDL 68, LDL 108, Hgb 12.7, platelets 211  Other Studies Reviewed Today:  Lexiscan Myoview 03/19/2020: No diagnostic ST segment changes to indicate ischemia. Small, mild intensity, apical lateral and basal septal defects that are fixed, more prominent on rest imaging in the septal distribution, and consistent with soft tissue attenuation. No large ischemic territories noted. This is a low risk study. Nuclear stress EF: 72%.  Assessment and Plan:  1.  Nonobstructive CAD by prior work-up with low risk Myoview in 2021.  She does not report any active angina and we continue with medical therapy and observation.  ECG reviewed and stable.  Continue aspirin, atenolol, Imdur, and as  needed nitroglycerin.  We will try low-dose Crestor 5 mg daily as well.  2.  Mixed hyperlipidemia with prior history of myalgias on Zocor.  LDL 108 by recent blood work.  We will try Crestor 5 mg daily and if tolerated recheck FLP and LFTs for next visit.  3.  Essential hypertension, no changes made to current regimen.  Medication Adjustments/Labs and Tests Ordered: Current medicines are reviewed at length with the patient today.  Concerns regarding medicines are outlined above.   Tests Ordered: Orders Placed This Encounter  Procedures   Lipid panel   Hepatic function panel   EKG 12-Lead    Medication Changes: Meds ordered this encounter  Medications   rosuvastatin (CRESTOR) 5 MG tablet    Sig: Take 1 tablet (5 mg total) by mouth  daily.    Dispense:  90 tablet    Refill:  3    06/03/2022 NEW    Disposition:  Follow up  6 months.  Signed, Satira Sark, MD, [facility] 06/03/2022 2:23 PM    [facility] [place], [place], [place] [address] Phone: [phone]; Fax: [phone]

## 2022-06-03 NOTE — Patient Instructions (Addendum)
Medication Instructions:  Your physician has recommended you make the following change in your medication:  Start crestor 5 mg daily at bedtime Continue other medications the same  Labwork: Your physician recommends that you return for a FASTING lipid &liver profile in 6 months just before your next visit. Please do not eat or drink for at least 8 hours when you have this done. You may take your medications that morning with a sip of water. [facility] (Monday-Friday from 7:30 am to 4:30 pm)  Testing/Procedures: none  Follow-Up: Your physician recommends that you schedule a follow-up appointment in: 6 months  Any Other Special Instructions Will Be Listed Below (If Applicable).  If you need a refill on your cardiac medications before your next appointment, please call your pharmacy.

## 2022-07-15 ENCOUNTER — Other Ambulatory Visit: Payer: Self-pay | Admitting: Cardiology

## 2022-11-05 ENCOUNTER — Other Ambulatory Visit: Payer: Self-pay | Admitting: Cardiology

## 2022-12-01 ENCOUNTER — Ambulatory Visit: Payer: Medicare HMO | Attending: Cardiology | Admitting: Cardiology

## 2022-12-01 ENCOUNTER — Encounter: Payer: Self-pay | Admitting: Cardiology

## 2022-12-01 VITALS — BP 148/74 | HR 57 | Ht 65.0 in | Wt 248.0 lb

## 2022-12-01 DIAGNOSIS — I890 Lymphedema, not elsewhere classified: Secondary | ICD-10-CM

## 2022-12-01 DIAGNOSIS — I25119 Atherosclerotic heart disease of native coronary artery with unspecified angina pectoris: Secondary | ICD-10-CM

## 2022-12-01 DIAGNOSIS — E782 Mixed hyperlipidemia: Secondary | ICD-10-CM

## 2022-12-01 NOTE — Patient Instructions (Signed)

## 2022-12-01 NOTE — Progress Notes (Signed)
    Cardiology Office Note  Date: 12/01/2022   ID: PAOLA KARAN, DOB 1936-03-28, MRN [id number]  History of Present Illness: Renee Dudley is an 87 y.o. female last seen in September 2023.  She is here for a routine visit.  Still having a lot of trouble with arthritic pain involving her knees and hips, also right rotator cuff pain.  Still following with an orthopedist.  She has also had worsening leg swelling/lymphedema.  I reviewed her medications, she reports compliance with therapy including Lasix 40 mg daily.  We discussed uptitrating this to 1-1/2 tablets for a few days at a time to help with leg swelling as needed.  She does not report any angina or nitroglycerin use.  She has tolerated switch from Zocor to Crestor and has follow-up lipids pending with PCP.  Physical Exam: VS:  BP (!) 148/74   Pulse (!) 57   Ht 5\' 5"  (1.651 m)   Wt 248 lb (112.5 kg)   SpO2 96%   BMI 41.27 kg/m , BMI Body mass index is 41.27 kg/m.  Wt Readings from Last 3 Encounters:  12/01/22 248 lb (112.5 kg)  06/03/22 242 lb 12.8 oz (110.1 kg)  03/08/22 244 lb (110.7 kg)    General: Patient appears comfortable at rest. HEENT: Conjunctiva and lids normal. Neck: Supple, no elevated JVP or carotid bruits. Lungs: Clear to auscultation, nonlabored breathing at rest. Cardiac: Regular rate and rhythm, no S3 or significant systolic murmur. Extremities: 2+ lower leg edema/lymphedema.  ECG:  An ECG dated 06/03/2022 was personally reviewed today and demonstrated:  Sinus bradycardia with low voltage.  Labwork:  December 2022: Cholesterol 204, triglycerides 82, HDL 74, LDL 115 March 2023: Hemoglobin 11.3, platelets 216, BUN 37, creatinine 1.29, potassium 4.7, AST 17, ALT 17, TSH 0.905  Other Studies Reviewed Today:  No interval cardiac testing for review today.  Assessment and Plan:  1.  Nonobstructive CAD by cardiac catheterization in 2007.  Follow-up Lexiscan Myoview in 2021 was low risk with no  significant ischemia and LVEF 72%.  She does not report any angina or nitroglycerin use.  Continue medical therapy including aspirin, atenolol, Imdur, Crestor, and as needed nitroglycerin.  2.  Mixed hyperlipidemia with history of statin myalgias on Zocor.  She was started on Crestor at last encounter.  Tolerating so far and has follow-up lipid panel with PCP visit soon.  3.  Bilateral leg edema/lymphedema.  Discussed using Lasix 60 mg for a few days at a time and elevating her legs periodically depending on symptoms.  She is otherwise on Lasix 40 mg daily.  Has not required potassium supplement.  Disposition:  Follow up  6 months.  Signed, Satira Sark, M.D., F.A.C.C.

## 2023-01-24 ENCOUNTER — Encounter: Payer: Self-pay | Admitting: *Deleted

## 2023-01-25 ENCOUNTER — Ambulatory Visit: Payer: Medicare HMO | Attending: Nurse Practitioner | Admitting: Nurse Practitioner

## 2023-01-25 ENCOUNTER — Encounter: Payer: Self-pay | Admitting: Nurse Practitioner

## 2023-01-25 VITALS — BP 125/82 | HR 58 | Ht 65.0 in | Wt 246.0 lb

## 2023-01-25 DIAGNOSIS — I2510 Atherosclerotic heart disease of native coronary artery without angina pectoris: Secondary | ICD-10-CM

## 2023-01-25 DIAGNOSIS — Z79899 Other long term (current) drug therapy: Secondary | ICD-10-CM

## 2023-01-25 DIAGNOSIS — I5032 Chronic diastolic (congestive) heart failure: Secondary | ICD-10-CM | POA: Diagnosis not present

## 2023-01-25 DIAGNOSIS — R062 Wheezing: Secondary | ICD-10-CM

## 2023-01-25 DIAGNOSIS — R0609 Other forms of dyspnea: Secondary | ICD-10-CM

## 2023-01-25 DIAGNOSIS — N183 Chronic kidney disease, stage 3 unspecified: Secondary | ICD-10-CM

## 2023-01-25 DIAGNOSIS — I10 Essential (primary) hypertension: Secondary | ICD-10-CM

## 2023-01-25 DIAGNOSIS — R0602 Shortness of breath: Secondary | ICD-10-CM

## 2023-01-25 DIAGNOSIS — I890 Lymphedema, not elsewhere classified: Secondary | ICD-10-CM

## 2023-01-25 DIAGNOSIS — E782 Mixed hyperlipidemia: Secondary | ICD-10-CM

## 2023-01-25 MED ORDER — LOSARTAN POTASSIUM 25 MG PO TABS: 12.5000 mg | ORAL_TABLET | Freq: Every day | ORAL | 1 refills | Status: DC

## 2023-01-25 NOTE — Patient Instructions (Addendum)
Medication Instructions:  Your physician has recommended you make the following change in your medication:  Start losartan 12.5 mg daily Continue other medications the same  Labwork: BMET, BNP & Mag Levels in 1 week (02/01/2023) Non-fasting [employer] or [employer] ([address] [place])  Testing/Procedures: none  Follow-Up: Your physician recommends that you schedule a follow-up appointment in: 6 weeks  Any Other Special Instructions Will Be Listed Below (If Applicable). You have been referred to Pulmonology   If you need a refill on your cardiac medications before your next appointment, please call your pharmacy.

## 2023-01-25 NOTE — Progress Notes (Signed)
Office Visit    Patient Name: Renee Dudley Date of Encounter: 01/25/2023  PCP:  Estanislado Pandy, MD   [facility]  Cardiologist:  Nona Dell, MD  Advanced Practice Provider:  No care team member to display Electrophysiologist:  None   Chief Complaint    Renee Dudley is a 87 y.o. female with a hx of nonobstructive CAD, mixed hyperlipidemia, bilateral leg edema and lymphedema, DJD, hypertension, history of renal insufficiency, who presents today for hospital follow-up.  Past Medical History    Past Medical History:  Diagnosis Date   Anxiety    Chronic headaches    Coronary atherosclerosis of native coronary artery    Nonobstructive at cardiac catheterization 2007    Depression    DJD (degenerative joint disease)    Essential hypertension    GERD (gastroesophageal reflux disease)    History of gout    Mixed hyperlipidemia    Peripheral edema    Primary localized osteoarthritis of left knee    Renal insufficiency    Varicose veins    Past Surgical History:  Procedure Laterality Date   ANTERIOR LAT LUMBAR FUSION N/A 07/01/2021   Procedure: Anterior lateral lumbar fusion - Lateral two-lateral three - Interbody Fusion;  Surgeon: Donalee Citrin, MD;  Location: [facility] OR;  Service: Neurosurgery;  Laterality: N/A;   APPENDECTOMY     Cataract surgery Bilateral    CHOLECYSTECTOMY  1986   COLONOSCOPY     LUMBAR LAMINECTOMY/DECOMPRESSION MICRODISCECTOMY Right 02/23/2017   Procedure: Right Lumbar Two-Three Microdiscectomy;  Surgeon: Donalee Citrin, MD;  Location: [facility] OR;  Service: Neurosurgery;  Laterality: Right;   LUMBAR PERCUTANEOUS PEDICLE SCREW 1 LEVEL N/A 07/01/2021   Procedure: LUMBAR PERCUTANEOUS PEDICLE SCREW 1 LEVEL;  Surgeon: Donalee Citrin, MD;  Location: [facility] OR;  Service: Neurosurgery;  Laterality: N/A;   Right total knee arthroplasty  2011   TOTAL ABDOMINAL HYSTERECTOMY W/ BILATERAL SALPINGOOPHORECTOMY  1988   TOTAL KNEE ARTHROPLASTY Left  12/23/2014   Procedure: LEFT TOTAL KNEE ARTHROPLASTY;  Surgeon: Salvatore Marvel, MD;  Location: [facility] OR;  Service: Orthopedics;  Laterality: Left;    Allergies  Allergies  Allergen Reactions   Penicillins Other (See Comments)    Pt can remember, happened 50 years ago Has patient had a PCN reaction causing immediate rash, facial/tongue/throat swelling, SOB or lightheadedness with hypotension: Unknown Has patient had a PCN reaction causing severe rash involving mucus membranes or skin necrosis: Unknown Has patient had a PCN reaction that required hospitalization:was inpatient at time of reaction Has patient had a PCN reaction occurring within the last 10 years: No If all of the above answers are "NO", then may proceed with C   Levofloxacin     Weakness    Prednisone    Zocor [Simvastatin]     UNSPECIFIED REACTION    Sulfa Antibiotics Nausea And Vomiting    History of Present Illness    Renee Dudley is a 87 y.o. female with a PMH as mentioned above.  Previous cardiovascular history includes cardiac catheterization in 2007 that revealed nonobstructive CAD.  Myoview in 2021 was low risk, no ischemia noted with EF 72%.  Last seen by Dr. Diona Browner on December 01, 2022.  Was doing well, did note worsening leg swelling/lymphedema.  Dr. Diona Browner discussed using Lasix 60 mg for few days at a time, recommended elevating her legs.  Admitted to [facility] in May 2024 with intermittent chest pain.  Was ruled out for ACS.  Stress  test was arranged and showed nondiagnostic EKG and subsequent imaging was negative for reversible ischemia.   2D echocardiogram was also arranged and revealed EF 65 to 70%, grade 1 DD.  Today she presents for hospital follow-up.  She states she has not had any recurrent chest pain. Does experience NYHA class II-III symptoms, particularly DOE. Says while at [facility], she received diuretics and "a gallon of liquid was pulled off of me." ProBNP elevated. Swelling has improved in  her lower extremities, but admits to chronic leg edema. Weight is stable. Denies any chest pain, palpitations, syncope, presyncope, dizziness, orthopnea, PND, significant weight changes, acute bleeding, or claudication.  EKGs/Labs/Other Studies Reviewed:   The following studies were reviewed today:   EKG:  EKG is not ordered today.   Myoview 01/2023 [facility]): 1. No reversible ischemia . 2. Normal left ventricular wall motion. 3. Left ventricular ejection fraction 76% 4. Non invasive risk stratification*: Low *2012 Appropriate Use Criteria for Coronary Revascularization Focused Update: J Am Coll Cardiol. 2012;59(9):857-881. http://content.dementiazones.com.aspx?articleid=[email or web address] Electronically Signed By: Genevive Bi M.D. On: 01/19/2023 15:31   Echo 01/2023 [facility]):  The left ventricle is normal in size with upper normal wall thickness. 2. The left ventricular systolic function is normal, LVEF is visually estimated at 65-70%. 3. There is grade I diastolic dysfunction (impaired relaxation). 4. There is mild aortic regurgitation. 5. The left atrium is mildly dilated in size. 6. The right ventricle is normal in size, with normal systolic function. Left Ventricle The left ventricle is normal in size with upper normal wall thickness. The left ventricular systolic function is normal, LVEF is visually estimated at 65-70%. There is grade I diastolic dysfunction (impaired relaxation). Right Ventricle The right ventricle is normal in size, with normal systolic function. Left Atrium The left atrium is mildly dilated in size. Right Atrium The right atrium is upper normal in size. Aortic Valve The aortic valve is trileaflet with mildly thickened leaflets with normal excursion. There is mild aortic regurgitation. There is no evidence of a significant transvalvular gradient. Mitral Valve The mitral valve leaflets are normal with normal leaflet mobility. Mitral annular calcification is present.  There is trivial mitral valve regurgitation. Tricuspid Valve The tricuspid valve leaflets are normal, with normal leaflet mobility. There is trivial tricuspid regurgitation. The pulmonary systolic pressure cannot be estimated due to insufficient TR signal. Pulmonic Valve The pulmonic valve is normal. There is mild pulmonic regurgitation. There is no evidence of a significant transvalvular gradient. Aorta The aorta is normal in size in the visualized segments. Inferior Vena Cava IVC size and inspiratory change suggest normal right atrial pressure. (0-5 mmHg). Pericardium/Pleural There is no pericardial effusion.   Myoview 03/2020: No diagnostic ST segment changes to indicate ischemia. Small, mild intensity, apical lateral and basal septal defects that are fixed, more prominent on rest imaging in the septal distribution, and consistent with soft tissue attenuation. No large ischemic territories noted. This is a low risk study. Nuclear stress EF: 72%.  Recent Labs: No results found for requested labs within last 365 days.  Recent Lipid Panel No results found for: "CHOL", "TRIG", "HDL", "CHOLHDL", "VLDL", "LDLCALC", "LDLDIRECT"   Home Medications   Current Meds  Medication Sig   aspirin EC 81 MG tablet Take 1 tablet (81 mg total) by mouth daily.   cholecalciferol (VITAMIN D3) 25 MCG (1000 UNIT) tablet Take 2,000 Units by mouth daily.   famotidine (PEPCID) 20 MG tablet Take 20 mg by mouth 2 (two) times daily.   furosemide (LASIX)  40 MG tablet Take 40 mg by mouth daily.     isosorbide mononitrate (IMDUR) 30 MG 24 hr tablet TAKE 1/2 OF A TABLET BY MOUTH DAILY (Patient taking differently: Take 30 mg by mouth daily.)   losartan (COZAAR) 25 MG tablet Take 0.5 tablets (12.5 mg total) by mouth daily.   Menthol, Topical Analgesic, (BIOFREEZE EX) Apply 1 application topically daily as needed (leg pain).   Multiple Vitamins-Minerals (PRESERVISION AREDS 2 PO) Take 1 capsule by mouth daily.   nitroGLYCERIN  (NITROSTAT) 0.4 MG SL tablet Place 1 tablet (0.4 mg total) under the tongue every 5 (five) minutes as needed for chest pain. (Patient taking differently: Place 0.4 mg under the tongue every 5 (five) minutes x 3 doses as needed for chest pain (if no relief after 3rd dose, proceed to ED or call 911).)   pramipexole (MIRAPEX) 0.25 MG tablet Take 0.5 mg by mouth at bedtime.   Probiotic Product (ALIGN) 4 MG CAPS Take 4 mg by mouth daily.   rosuvastatin (CRESTOR) 5 MG tablet Take 1 tablet (5 mg total) by mouth daily.   sertraline (ZOLOFT) 25 MG tablet Take 50 mg by mouth daily.   traMADol (ULTRAM) 50 MG tablet Take 50 mg by mouth 3 (three) times daily as needed.     Review of Systems    All other systems reviewed and are otherwise negative except as noted above.  Physical Exam    VS:  BP 125/82 (BP Location: Left Arm, Patient Position: Sitting, Cuff Size: Normal)   Pulse (!) 58   Ht 5\' 5"  (1.651 m)   Wt 246 lb (111.6 kg)   SpO2 97%   BMI 40.94 kg/m  , BMI Body mass index is 40.94 kg/m.  Wt Readings from Last 3 Encounters:  01/25/23 246 lb (111.6 kg)  12/01/22 248 lb (112.5 kg)  06/03/22 242 lb 12.8 oz (110.1 kg)     GEN: Morbidly obese, 87 y.o. female in no acute distress. HEENT: normal. Neck: Supple, no JVD, carotid bruits, or masses. Cardiac: S1/S2, RRR, no murmurs, rubs, or gallops. No clubbing, cyanosis.  Chronic nonpitting lower extremity edema/lymphedema. Radials 2+ /PT 1+ and equal bilaterally.  Respiratory:  Respirations regular and unlabored, clear with diminished expiratory wheezing noted bilaterally. MS: No deformity or atrophy. Skin: Warm and dry, no rash. Neuro:  Strength and sensation are intact. Psych: Normal affect.  Assessment & Plan    HFpEF, exertional shortness of breath, medication management Etiology most likely nonischemic. Stage C, NYHA class II-III symptoms. Echo at [facility] showed normal EF. Will initiate losartan 12.5 mg daily. Continue Lasix and Imdur. Will  obtain BMET, proBNP, and Mag in 1 week. Low sodium diet, fluid restriction <2L, and daily weights encouraged. Educated to contact our office for weight gain of 2 lbs overnight or 5 lbs in one week.  Non-obstructive CAD Stable with no anginal symptoms. No indication for ischemic evaluation. Recent NST at [facility] negative for ischemia. Continue ASA, imdur, rosuvastatin. Initiating losartan as mentioned above. Heart healthy diet and regular cardiovascular exercise encouraged.   Mixed HLD Continue rosuvastatin. Will request labs at next OV. Heart healthy diet and regular cardiovascular exercise encouraged.   Lymphedema Chronic, stable. Discussed lymphedema clinic referral, however politely declined and stated she wanted to think about this. Continue Lasix.   HTN BP stable. Will start losartan as mentioned above. Discussed to monitor BP at home at least 2 hours after medications and sitting for 5-10 minutes. No other medication changes. Heart healthy diet  and regular cardiovascular exercise encouraged.   CKD stage 3 Kidney function stable. Starting losartan as mentioned above. Will recheck BMET in 1 week. Avoid nephrotoxic agents. Continue to follow with PCP.  7. Expiratory wheezing, DOE Noted on exam today. Has not seen pulmonology. Not in acute distress on exam. Will refer to pulmonology for evaluation.    Disposition: Follow up in 6 week(s) with Nona Dell, MD or APP.  Signed, Sharlene Dory, NP 01/25/2023, 12:46 PM [facility]

## 2023-02-09 ENCOUNTER — Telehealth: Payer: Self-pay | Admitting: Cardiology

## 2023-02-09 ENCOUNTER — Telehealth: Payer: Self-pay

## 2023-02-09 NOTE — Telephone Encounter (Signed)
.  Hi Lanora Manis,  You recently  saw Ms. Ferris in clinic  for hospital follow-up on 01/25/2023.  We received a clearance request for an  internal ptosis repair.  Do you feel she would be at acceptable risk to proceed with this scheduled procedure.  Please forward your reply back to P [facility] PREOP.  Thanks, Alden Server

## 2023-02-09 NOTE — Telephone Encounter (Signed)
-----   Message from Sharlene Dory, NP sent at 02/08/2023 11:02 AM EDT ----- Labs look good overall. No changes at this time to her medication regimen.   Sharlene Dory, AGNP-C

## 2023-02-09 NOTE — Telephone Encounter (Signed)
Patient notified and verbalized understanding. Patient had no questions or concerns at this time. PCP copied 

## 2023-02-09 NOTE — Telephone Encounter (Signed)
   Pre-operative Risk Assessment    Patient Name: Renee Dudley  DOB: [birth year] MRN: [id number]      Request for Surgical Clearance    Procedure:   Internal Ptosis CC: BULB  Date of Surgery:  Clearance 02/22/23                                 Surgeon:  Dr. Shawna Orleans Surgeon's Group or Practice Name:  [facility] Phone number:  [phone] Fax number:  [phone]   Type of Clearance Requested:   - Medical    Type of Anesthesia:  Local    Additional requests/questions:    SignedSeymour Bars   02/09/2023, 4:11 PM

## 2023-02-14 NOTE — Telephone Encounter (Signed)
Ms. Surrency perioperative risk of a major cardiac event is 0.4% according to the Revised Cardiac Risk Index (RCRI).  Therefore, she is at low risk for perioperative complications.   Her functional capacity is fair at 4.4 METs according to the Duke Activity Status Index (DASI). Recommendations: According to ACC/AHA guidelines, no further cardiovascular testing needed.  The patient may proceed to surgery at acceptable risk.   Antiplatelet and/or Anticoagulation Recommendations: From a cardiac perspective, we prefer aspirin to be continued unless surgeon feels bleeding risk is too high.  If surgeon feels that bleeding risk is too high, okay to hold aspirin 7 days prior to procedure and resume when it is felt safe to do so.  Sharlene Dory, NP

## 2023-03-08 ENCOUNTER — Encounter: Payer: Self-pay | Admitting: Nurse Practitioner

## 2023-03-08 ENCOUNTER — Ambulatory Visit: Payer: Medicare HMO | Attending: Nurse Practitioner | Admitting: Nurse Practitioner

## 2023-03-08 VITALS — BP 143/72 | HR 84 | Ht 65.0 in | Wt 247.0 lb

## 2023-03-08 DIAGNOSIS — Z87448 Personal history of other diseases of urinary system: Secondary | ICD-10-CM

## 2023-03-08 DIAGNOSIS — R0602 Shortness of breath: Secondary | ICD-10-CM | POA: Diagnosis not present

## 2023-03-08 DIAGNOSIS — E782 Mixed hyperlipidemia: Secondary | ICD-10-CM

## 2023-03-08 DIAGNOSIS — I10 Essential (primary) hypertension: Secondary | ICD-10-CM

## 2023-03-08 DIAGNOSIS — I2510 Atherosclerotic heart disease of native coronary artery without angina pectoris: Secondary | ICD-10-CM

## 2023-03-08 DIAGNOSIS — Z79899 Other long term (current) drug therapy: Secondary | ICD-10-CM | POA: Diagnosis not present

## 2023-03-08 DIAGNOSIS — R062 Wheezing: Secondary | ICD-10-CM

## 2023-03-08 DIAGNOSIS — I890 Lymphedema, not elsewhere classified: Secondary | ICD-10-CM

## 2023-03-08 DIAGNOSIS — I5032 Chronic diastolic (congestive) heart failure: Secondary | ICD-10-CM | POA: Diagnosis not present

## 2023-03-08 DIAGNOSIS — R0609 Other forms of dyspnea: Secondary | ICD-10-CM

## 2023-03-08 MED ORDER — SACUBITRIL-VALSARTAN 24-26 MG PO TABS
1.0000 | ORAL_TABLET | Freq: Two times a day (BID) | ORAL | Status: DC
Start: 2023-03-08 — End: 2023-08-17

## 2023-03-08 NOTE — Patient Instructions (Addendum)
Medication Instructions:  Your physician has recommended you make the following change in your medication: Stop losartan  Start entresto  Continue all other medication as prescribed.  Labwork: BMET in 1-2 weeks at [facility]  Testing/Procedures: none  Follow-Up: Your physician recommends that you schedule a follow-up appointment in: 6 months  Any Other Special Instructions Will Be Listed Below (If Applicable).  If you need a refill on your cardiac medications before your next appointment, please call your pharmacy

## 2023-03-08 NOTE — Progress Notes (Unsigned)
Cardiology Office Note:  .   Date:  03/08/2023 ID:  Renee Dudley, DOB 1936-05-09, MRN [id number] PCP: Estanislado Pandy, MD  [facility] Providers Cardiologist:  Nona Dell, MD    History of Present Illness: Renee Dudley Kitchen   Renee Dudley is a 87 y.o. female with PMH of HFpEF, nonobstructive CAD, exertional shortness of breath, mixed HLD, bilateral leg edema and lymphedema, hypertension, history of renal insufficiency, who presents today for hospital follow-up.  Previous cardiovascular history includes cardiac catheterization in 2007 that revealed nonobstructive CAD.  Myoview in 2021 was low risk, no ischemia noted with EF 72%.   Last seen by Dr. Diona Browner on December 01, 2022.  Was doing well, did note worsening leg swelling/lymphedema.  Dr. Diona Browner discussed using Lasix 60 mg for few days at a time, recommended elevating her legs.   Admitted May 2024 with intermittent chest pain.  Was ruled out for ACS.  Stress test showed nondiagnostic EKG and subsequent imaging was negative for reversible ischemia. TTE EF 65 to 70%, grade 1 DD.  Today she presents for scheduled follow-up. She states she has her good days and not so good days. Activity is limited by chronic back pain, sees a specialist for this. Denies any chest pain, shortness of breath, palpitations, syncope, presyncope, dizziness, orthopnea, PND, significant weight changes, acute bleeding, or claudication. Continues to note exertional shortness of breath occasionally. Does report HH RN does help her at home. Tolerated recent eye surgery well.   Studies Reviewed: Renee Dudley Kitchen       Myoview 01/2023 [facility]): 1. No reversible ischemia . 2. Normal left ventricular wall motion. 3. Left ventricular ejection fraction 76% 4. Non invasive risk stratification*: Low *2012 Appropriate Use Criteria for Coronary Revascularization Focused Update: J Am Coll Cardiol. 2012;59(9):857-881. http://content.dementiazones.com.aspx?articleid=1201161  Electronically Signed By: Genevive Bi M.D. On: 01/19/2023 15:31    Echo 01/2023 [facility]):  The left ventricle is normal in size with upper normal wall thickness. 2. The left ventricular systolic function is normal, LVEF is visually estimated at 65-70%. 3. There is grade I diastolic dysfunction (impaired relaxation). 4. There is mild aortic regurgitation. 5. The left atrium is mildly dilated in size. 6. The right ventricle is normal in size, with normal systolic function. Left Ventricle The left ventricle is normal in size with upper normal wall thickness. The left ventricular systolic function is normal, LVEF is visually estimated at 65-70%. There is grade I diastolic dysfunction (impaired relaxation). Right Ventricle The right ventricle is normal in size, with normal systolic function. Left Atrium The left atrium is mildly dilated in size. Right Atrium The right atrium is upper normal in size. Aortic Valve The aortic valve is trileaflet with mildly thickened leaflets with normal excursion. There is mild aortic regurgitation. There is no evidence of a significant transvalvular gradient. Mitral Valve The mitral valve leaflets are normal with normal leaflet mobility. Mitral annular calcification is present. There is trivial mitral valve regurgitation. Tricuspid Valve The tricuspid valve leaflets are normal, with normal leaflet mobility. There is trivial tricuspid regurgitation. The pulmonary systolic pressure cannot be estimated due to insufficient TR signal. Pulmonic Valve The pulmonic valve is normal. There is mild pulmonic regurgitation. There is no evidence of a significant transvalvular gradient. Aorta The aorta is normal in size in the visualized segments. Inferior Vena Cava IVC size and inspiratory change suggest normal right atrial pressure. (0-5 mmHg). Pericardium/Pleural There is no pericardial effusion.   Risk Assessment/Calculations:     HYPERTENSION CONTROL Vitals:  03/08/23 1254  03/08/23 1326  BP: (!) 150/88 (!) 143/72    The patient's blood pressure is elevated above target today.  In order to address the patient's elevated BP: A new medication was prescribed today.; Blood pressure will be monitored at home to determine if medication changes need to be made.; Follow up with general cardiology has been recommended.          Physical Exam:   VS:  BP (!) 143/72 (BP Location: Left Arm, Patient Position: Sitting, Cuff Size: Normal)   Pulse 84   Ht 5\' 5"  (1.651 m)   Wt 247 lb (112 kg)   SpO2 97%   BMI 41.10 kg/m    Wt Readings from Last 3 Encounters:  03/08/23 247 lb (112 kg)  01/25/23 246 lb (111.6 kg)  12/01/22 248 lb (112.5 kg)    GEN: Morbidly obese female in no acute distress NECK: No JVD; No carotid bruits CARDIAC: S1/S2, RRR, no murmurs, rubs, gallops RESPIRATORY:  Diminished with expiratory wheezing noted to auscultation without rales or rhonchi  EXTREMITIES:  Nonpitting lymphedema to BLE; No deformity   ASSESSMENT AND PLAN: .    HFpEF, exertional shortness of breath, medication management Etiology most likely nonischemic. Stage C, NYHA class II-III symptoms. Echo at [facility] showed normal EF. Euvolemic and well compensated on exam. Will stop losartan and switch to Entresto 24/26 mg BID. Continue Lasix and Imdur. Will obtain BMET in 1 week. Low sodium diet, fluid restriction <2L, and daily weights encouraged. Educated to contact our office for weight gain of 2 lbs overnight or 5 lbs in one week.   Non-obstructive CAD Stable with no anginal symptoms. No indication for ischemic evaluation. NST this year at [facility] negative for ischemia. Continue ASA, imdur, rosuvastatin. Heart healthy diet and regular cardiovascular exercise encouraged.    Mixed HLD Continue rosuvastatin, has upcoming labs with PCP. Will request labs at next OV. Heart healthy diet and regular cardiovascular exercise encouraged.    Lymphedema Chronic, stable. Discussed lymphedema clinic  referral, however politely declined and stated she wanted to think about this. Continue Lasix.    HTN BP elevated. Stopping losartan and switching to Entresto 24/26 mg BID. Discussed to monitor BP at home at least 2 hours after medications and sitting for 5-10 minutes. No other medication changes. Heart healthy diet and regular cardiovascular exercise encouraged. Will obtain BMET in 1 week.   Hx of renal insufficiency Kidney function stable. Stopping losartan and starting Entresto as mentioned above. Will recheck BMET in 1 week. Avoid nephrotoxic agents. Continue to follow with PCP.   7. Expiratory wheezing, DOE Noted on exam today. Has not seen pulmonology. Not in acute distress on exam. Will resend referral to pulmonology for evaluation.   Dispo: Follow-up with Dr. Diona Browner or APP in 6 months or sooner if anything changes.  Signed, Sharlene Dory, NP

## 2023-03-09 ENCOUNTER — Encounter: Payer: Self-pay | Admitting: Nurse Practitioner

## 2023-03-10 ENCOUNTER — Other Ambulatory Visit: Payer: Self-pay | Admitting: Nurse Practitioner

## 2023-03-10 ENCOUNTER — Telehealth: Payer: Self-pay | Admitting: Cardiology

## 2023-03-10 MED ORDER — ENTRESTO 24-26 MG PO TABS: 1.0000 | ORAL_TABLET | Freq: Two times a day (BID) | ORAL | 5 refills | Status: DC

## 2023-03-10 NOTE — Telephone Encounter (Signed)
*  STAT* If patient is at the pharmacy, call can be transferred to refill team.   1. Which medications need to be refilled? (please list name of each medication and dose if known)  sacubitril-valsartan (ENTRESTO) 24-26 mg per tablet   2. Which pharmacy/location (including street and city if local pharmacy) is medication to be sent to?  [id number] - [place], [place] - [address]    3. Do they need a 30 day or 90 day supply? 90   Pharmacy states they didn't get the refill

## 2023-03-15 ENCOUNTER — Other Ambulatory Visit (HOSPITAL_COMMUNITY)
Admission: RE | Admit: 2023-03-15 | Discharge: 2023-03-15 | Disposition: A | Discharge status: Home / Self Care | Payer: Medicare HMO | Source: Ambulatory Visit | Attending: Nurse Practitioner | Admitting: Nurse Practitioner

## 2023-03-15 DIAGNOSIS — Z79899 Other long term (current) drug therapy: Secondary | ICD-10-CM | POA: Insufficient documentation

## 2023-03-15 LAB — BASIC METABOLIC PANEL
Anion gap: 8 (ref 5–15)
BUN: 29 mg/dL — ABNORMAL HIGH (ref 8–23)
CO2: 26 mmol/L (ref 22–32)
Calcium: 8.9 mg/dL (ref 8.9–10.3)
Chloride: 104 mmol/L (ref 98–111)
Creatinine, Ser: 0.92 mg/dL (ref 0.44–1.00)
GFR, Estimated: >60 mL/min (ref >60)
Glucose, Bld: 108 mg/dL — ABNORMAL HIGH (ref 70–99)
Potassium: 3.6 mmol/L (ref 3.5–5.1)
Sodium: 138 mmol/L (ref 135–145)

## 2023-04-04 ENCOUNTER — Telehealth: Payer: Self-pay | Admitting: Cardiology

## 2023-04-04 NOTE — Telephone Encounter (Signed)
Pt c/o medication issue:  1. Name of Medication: sacubitril-valsartan (ENTRESTO) 24-26 MG   2. How are you currently taking this medication (dosage and times per day)?   Take 1 tablet by mouth 2 (two) times daily.    3. Are you having a reaction (difficulty breathing--STAT)? No  4. What is your medication issue? Patient states this medication is making her feel extremely hungry and it is causing her to gain weight. Please advise.

## 2023-04-04 NOTE — Telephone Encounter (Signed)
Reports since starting entresto, her appetite has increased and she wants to eat all the time. Reports gaining 12-15 lbs since starting entresto. Denies worsening swelling or SOB. Denies chest pain or dizziness. Reports weighing daily. Weight today 249 lbs, yesterday 249 lbs, Saturday 248 lbs, Friday 248.6 lbs. Advised that last weight in the office on 03/08/2023 was 247 lbs. Advised she could hold entresto for a few days to see if her appetite decreases and call office back with the response. Verbalized understanding of plan.

## 2023-05-24 ENCOUNTER — Other Ambulatory Visit: Payer: Self-pay | Admitting: Nurse Practitioner

## 2023-05-27 ENCOUNTER — Other Ambulatory Visit: Payer: Self-pay | Admitting: Cardiology

## 2023-06-10 ENCOUNTER — Ambulatory Visit: Payer: Medicare HMO | Admitting: Cardiology

## 2023-08-15 ENCOUNTER — Encounter: Payer: Self-pay | Admitting: Nurse Practitioner

## 2023-08-15 ENCOUNTER — Ambulatory Visit: Payer: Medicare HMO | Attending: Nurse Practitioner | Admitting: Nurse Practitioner

## 2023-08-15 VITALS — BP 116/68 | HR 84 | Ht 66.0 in | Wt 251.6 lb

## 2023-08-15 DIAGNOSIS — I2510 Atherosclerotic heart disease of native coronary artery without angina pectoris: Secondary | ICD-10-CM | POA: Diagnosis not present

## 2023-08-15 DIAGNOSIS — Z79899 Other long term (current) drug therapy: Secondary | ICD-10-CM

## 2023-08-15 DIAGNOSIS — I10 Essential (primary) hypertension: Secondary | ICD-10-CM

## 2023-08-15 DIAGNOSIS — I5032 Chronic diastolic (congestive) heart failure: Secondary | ICD-10-CM

## 2023-08-15 DIAGNOSIS — I890 Lymphedema, not elsewhere classified: Secondary | ICD-10-CM

## 2023-08-15 DIAGNOSIS — N183 Chronic kidney disease, stage 3 unspecified: Secondary | ICD-10-CM

## 2023-08-15 DIAGNOSIS — E782 Mixed hyperlipidemia: Secondary | ICD-10-CM

## 2023-08-15 DIAGNOSIS — Z87448 Personal history of other diseases of urinary system: Secondary | ICD-10-CM

## 2023-08-15 MED ORDER — FUROSEMIDE 40 MG PO TABS: 40.0000 mg | ORAL_TABLET | Freq: Two times a day (BID) | ORAL | 1 refills | Status: DC

## 2023-08-15 NOTE — Progress Notes (Unsigned)
Cardiology Office Note:  .   Date:  03/08/2023 ID:  Renee Dudley, DOB 1935-12-16, MRN [id number] PCP: Estanislado Pandy, MD  [facility] Providers Cardiologist:  Nona Dell, MD    History of Present Illness: Renee Dudley   Renee Dudley is a 87 y.o. female with PMH of HFpEF, nonobstructive CAD, exertional shortness of breath, mixed HLD, bilateral leg edema and lymphedema, hypertension, history of renal insufficiency, who presents today for hospital follow-up.  Previous cardiovascular history includes cardiac catheterization in 2007 that revealed nonobstructive CAD.  Myoview in 2021 was low risk, no ischemia noted with EF 72%.   Last seen by Dr. Diona Browner on December 01, 2022.  Was doing well, did note worsening leg swelling/lymphedema.  Dr. Diona Browner discussed using Lasix 60 mg for few days at a time, recommended elevating her legs.   Admitted May 2024 with intermittent chest pain.  Was ruled out for ACS.  Stress test showed nondiagnostic EKG and subsequent imaging was negative for reversible ischemia. TTE EF 65 to 70%, grade 1 DD.  Today she presents for scheduled follow-up. She states she has her good days and not so good days. Activity is limited by chronic back pain, sees a specialist for this. Denies any chest pain, shortness of breath, palpitations, syncope, presyncope, dizziness, orthopnea, PND, significant weight changes, acute bleeding, or claudication. Continues to note exertional shortness of breath occasionally. Does report HH RN does help her at home. Tolerated recent eye surgery well.   Studies Reviewed: Renee Dudley       Myoview 01/2023 [facility]): 1. No reversible ischemia . 2. Normal left ventricular wall motion. 3. Left ventricular ejection fraction 76% 4. Non invasive risk stratification*: Low *2012 Appropriate Use Criteria for Coronary Revascularization Focused Update: J Am Coll Cardiol. 2012;59(9):857-881. http://content.dementiazones.com.aspx?articleid=[email or web address]  Electronically Signed By: Genevive Bi M.D. On: 01/19/2023 15:31    Echo 01/2023 [facility]):  The left ventricle is normal in size with upper normal wall thickness. 2. The left ventricular systolic function is normal, LVEF is visually estimated at 65-70%. 3. There is grade I diastolic dysfunction (impaired relaxation). 4. There is mild aortic regurgitation. 5. The left atrium is mildly dilated in size. 6. The right ventricle is normal in size, with normal systolic function. Left Ventricle The left ventricle is normal in size with upper normal wall thickness. The left ventricular systolic function is normal, LVEF is visually estimated at 65-70%. There is grade I diastolic dysfunction (impaired relaxation). Right Ventricle The right ventricle is normal in size, with normal systolic function. Left Atrium The left atrium is mildly dilated in size. Right Atrium The right atrium is upper normal in size. Aortic Valve The aortic valve is trileaflet with mildly thickened leaflets with normal excursion. There is mild aortic regurgitation. There is no evidence of a significant transvalvular gradient. Mitral Valve The mitral valve leaflets are normal with normal leaflet mobility. Mitral annular calcification is present. There is trivial mitral valve regurgitation. Tricuspid Valve The tricuspid valve leaflets are normal, with normal leaflet mobility. There is trivial tricuspid regurgitation. The pulmonary systolic pressure cannot be estimated due to insufficient TR signal. Pulmonic Valve The pulmonic valve is normal. There is mild pulmonic regurgitation. There is no evidence of a significant transvalvular gradient. Aorta The aorta is normal in size in the visualized segments. Inferior Vena Cava IVC size and inspiratory change suggest normal right atrial pressure. (0-5 mmHg). Pericardium/Pleural There is no pericardial effusion.   Risk Assessment/Calculations:     No BP recorded.  {  Refresh Note OR Click here to  enter BP  :1}***       Physical Exam:   VS:  Ht 5\' 6"  (1.676 m)   Wt 251 lb 9.6 oz (114.1 kg)   BMI 40.61 kg/m    Wt Readings from Last 3 Encounters:  08/15/23 251 lb 9.6 oz (114.1 kg)  03/08/23 247 lb (112 kg)  01/25/23 246 lb (111.6 kg)    GEN: Morbidly obese female in no acute distress NECK: No JVD; No carotid bruits CARDIAC: S1/S2, RRR, no murmurs, rubs, gallops RESPIRATORY:  Diminished with expiratory wheezing noted to auscultation without rales or rhonchi  EXTREMITIES:  Nonpitting lymphedema to BLE; No deformity   ASSESSMENT AND PLAN: .    HFpEF, exertional shortness of breath, medication management Etiology most likely nonischemic. Stage C, NYHA class II-III symptoms. Echo at [facility] showed normal EF. Euvolemic and well compensated on exam. Will stop losartan and switch to Entresto 24/26 mg BID. Continue Lasix and Imdur. Will obtain BMET in 1 week. Low sodium diet, fluid restriction <2L, and daily weights encouraged. Educated to contact our office for weight gain of 2 lbs overnight or 5 lbs in one week.   Non-obstructive CAD Stable with no anginal symptoms. No indication for ischemic evaluation. NST this year at [facility] negative for ischemia. Continue ASA, imdur, rosuvastatin. Heart healthy diet and regular cardiovascular exercise encouraged.    Mixed HLD Continue rosuvastatin, has upcoming labs with PCP. Will request labs at next OV. Heart healthy diet and regular cardiovascular exercise encouraged.    Lymphedema Chronic, stable. Discussed lymphedema clinic referral, however politely declined and stated she wanted to think about this. Continue Lasix.    HTN BP elevated. Stopping losartan and switching to Entresto 24/26 mg BID. Discussed to monitor BP at home at least 2 hours after medications and sitting for 5-10 minutes. No other medication changes. Heart healthy diet and regular cardiovascular exercise encouraged. Will obtain BMET in 1 week.   Hx of renal insufficiency Kidney  function stable. Stopping losartan and starting Entresto as mentioned above. Will recheck BMET in 1 week. Avoid nephrotoxic agents. Continue to follow with PCP.   7. Expiratory wheezing, DOE Noted on exam today. Has not seen pulmonology. Not in acute distress on exam. Will resend referral to pulmonology for evaluation.   Dispo: Follow-up with Dr. Diona Browner or APP in 6 months or sooner if anything changes.  Signed, Sharlene Dory, NP

## 2023-08-15 NOTE — Patient Instructions (Addendum)
Medication Instructions:  Your physician has recommended you make the following change in your medication:  Please Increase your Lasix to 60 Mg in the Morning and 40 Mg in the Afternoon for the next 3 days, then reduce back down to 40 Mg twice daily   Labwork: In 1 week   Testing/Procedures: None   Follow-Up: Your physician recommends that you schedule a follow-up appointment in: 6-8 weeks   Any Other Special Instructions Will Be Listed Below (If Applicable).  If you need a refill on your cardiac medications before your next appointment, please call your pharmacy.

## 2023-08-19 ENCOUNTER — Other Ambulatory Visit: Payer: Self-pay | Admitting: Cardiology

## 2023-08-31 ENCOUNTER — Encounter: Payer: Self-pay | Admitting: Nurse Practitioner

## 2023-09-08 ENCOUNTER — Ambulatory Visit: Payer: Medicare HMO | Admitting: Nurse Practitioner

## 2023-09-29 ENCOUNTER — Ambulatory Visit: Payer: Medicare HMO | Admitting: Nurse Practitioner

## 2023-09-29 NOTE — Progress Notes (Deleted)
Cardiology Office Note:  .   Date:  08/15/2023 ID:  Renee Dudley, DOB Feb 23, 1936, MRN [id number] PCP: Estanislado Pandy, MD  [facility] Providers Cardiologist:  Nona Dell, MD    History of Present Illness: Renee Dudley Kitchen   Renee Dudley is a 88 y.o. female with PMH of HFpEF, nonobstructive CAD, exertional shortness of breath, mixed HLD, bilateral leg edema and lymphedema, hypertension, history of renal insufficiency, who presents today for hospital follow-up.  Previous cardiovascular history includes cardiac catheterization in 2007 that revealed nonobstructive CAD.  Myoview in 2021 was low risk, no ischemia noted with EF 72%.   Last seen by Dr. Diona Browner on December 01, 2022.  Was doing well, did note worsening leg swelling/lymphedema.  Dr. Diona Browner discussed using Lasix 60 mg for few days at a time, recommended elevating her legs.   Admitted May 2024 with intermittent chest pain.  Was ruled out for ACS.  Stress test showed nondiagnostic EKG and subsequent imaging was negative for reversible ischemia. TTE EF 65 to 70%, grade 1 DD.  Since I last saw her in the office, she visited [facility] on August 02, 2023 for leg swelling and shortness of breath.  She stated at the time of ED visit that she did not take her medication.  Described DOE earlier in the day, felt better during time of ED arrival.  Denied any chest pain.  Diuresed more than a liter at the time.  Was discharged after diuresis.  Today she presents for scheduled follow-up.  She says that her leg swelling has been getting worse recently, says her RLS is bothersome to her.  Confirms with me that she is not taking Entresto as previously prescribed and continues to take losartan only. Denies any chest pain, shortness of breath, palpitations, syncope, presyncope, dizziness, orthopnea, PND, or significant weight changes, acute bleeding, or claudication.  Says she was going to pick something up recently and felt the right side of her  rib cage pop, does have history of frozen shoulder along right side, has received injections for this in the past, says she has not had any on the left side.  ROS: Negative.  See HPI.  Studies Reviewed: Renee Dudley Kitchen       Myoview 01/2023 [facility]): 1. No reversible ischemia . 2. Normal left ventricular wall motion. 3. Left ventricular ejection fraction 76% 4. Non invasive risk stratification*: Low *2012 Appropriate Use Criteria for Coronary Revascularization Focused Update: J Am Coll Cardiol. 2012;59(9):857-881. http://content.[email or web address]?[email or web address] Electronically Signed By: Genevive Bi M.D. On: 01/19/2023 15:31    Echo 01/2023 [facility]):  The left ventricle is normal in size with upper normal wall thickness. 2. The left ventricular systolic function is normal, LVEF is visually estimated at 65-70%. 3. There is grade I diastolic dysfunction (impaired relaxation). 4. There is mild aortic regurgitation. 5. The left atrium is mildly dilated in size. 6. The right ventricle is normal in size, with normal systolic function. Left Ventricle The left ventricle is normal in size with upper normal wall thickness. The left ventricular systolic function is normal, LVEF is visually estimated at 65-70%. There is grade I diastolic dysfunction (impaired relaxation). Right Ventricle The right ventricle is normal in size, with normal systolic function. Left Atrium The left atrium is mildly dilated in size. Right Atrium The right atrium is upper normal in size. Aortic Valve The aortic valve is trileaflet with mildly thickened leaflets with normal excursion. There is mild aortic regurgitation. There is no evidence of a significant transvalvular  gradient. Mitral Valve The mitral valve leaflets are normal with normal leaflet mobility. Mitral annular calcification is present. There is trivial mitral valve regurgitation. Tricuspid Valve The tricuspid valve leaflets are normal, with normal leaflet  mobility. There is trivial tricuspid regurgitation. The pulmonary systolic pressure cannot be estimated due to insufficient TR signal. Pulmonic Valve The pulmonic valve is normal. There is mild pulmonic regurgitation. There is no evidence of a significant transvalvular gradient. Aorta The aorta is normal in size in the visualized segments. Inferior Vena Cava IVC size and inspiratory change suggest normal right atrial pressure. (0-5 mmHg). Pericardium/Pleural There is no pericardial effusion.    Physical Exam:   VS:  There were no vitals taken for this visit.   Wt Readings from Last 3 Encounters:  08/15/23 251 lb 9.6 oz (114.1 kg)  03/08/23 247 lb (112 kg)  01/25/23 246 lb (111.6 kg)    GEN: Morbidly obese female in no acute distress NECK: No JVD; No carotid bruits CARDIAC: S1/S2, RRR, no murmurs, rubs, gallops RESPIRATORY: Clear and diminished, no adventitious breath sounds heard on exam  EXTREMITIES:  Nonpitting lymphedema to BLE; No deformity   ASSESSMENT AND PLAN: .    HFpEF, lymphedema, medication management Etiology most likely nonischemic. Stage C, NYHA class I-II symptoms. Echo at [facility] showed normal EF.  Does notice some increased leg edema recently.  Will increase Lasix to 60 mg in a.m. and 40 mg in p.m. x 3 days, then return to normal dosing.  Will obtain BMET, magnesium, proBNP in 1 week at [facility].  Continue rest of medication regimen.  GDMT limited due to her current BP. Low sodium diet, fluid restriction <2L, and daily weights encouraged. Educated to contact our office for weight gain of 2 lbs overnight or 5 lbs in one week.   Non-obstructive CAD Stable with no anginal symptoms. No indication for ischemic evaluation. NST this year at [facility] negative for ischemia. Continue ASA, imdur, rosuvastatin. Heart healthy diet and regular cardiovascular exercise encouraged.    Mixed HLD Continue rosuvastatin, will request most recent labs from Dr. Dian Situ office.  Heart healthy diet and  regular cardiovascular exercise encouraged.    Lymphedema Worsening leg edema recently.  Increasing Lasix dosage as noted above.  Deviously discussed lymphedema clinic referral, however politely declined and stated she wanted to think about this.    HTN BP stable. Discussed to monitor BP at home at least 2 hours after medications and sitting for 5-10 minutes. No medication changes at this time. Heart healthy diet and regular cardiovascular exercise encouraged.    Hx of renal insufficiency  Will request most recent labs from PCPs office-also getting lab work as noted above.  Avoid nephrotoxic agents. Continue to follow with PCP.   Dispo: Follow-up with Dr. Diona Browner or APP in 6-8 weeks or sooner if anything changes.  Signed, Sharlene Dory, NP

## 2023-10-31 ENCOUNTER — Encounter: Payer: Self-pay | Admitting: Nurse Practitioner

## 2023-10-31 ENCOUNTER — Ambulatory Visit: Payer: Medicare HMO | Attending: Nurse Practitioner | Admitting: Nurse Practitioner

## 2023-10-31 VITALS — BP 114/78 | HR 78 | Ht 66.0 in | Wt 256.0 lb

## 2023-10-31 DIAGNOSIS — I10 Essential (primary) hypertension: Secondary | ICD-10-CM

## 2023-10-31 DIAGNOSIS — I2510 Atherosclerotic heart disease of native coronary artery without angina pectoris: Secondary | ICD-10-CM

## 2023-10-31 DIAGNOSIS — Z79899 Other long term (current) drug therapy: Secondary | ICD-10-CM | POA: Diagnosis not present

## 2023-10-31 DIAGNOSIS — I5032 Chronic diastolic (congestive) heart failure: Secondary | ICD-10-CM | POA: Diagnosis not present

## 2023-10-31 DIAGNOSIS — E782 Mixed hyperlipidemia: Secondary | ICD-10-CM

## 2023-10-31 DIAGNOSIS — I890 Lymphedema, not elsewhere classified: Secondary | ICD-10-CM

## 2023-10-31 DIAGNOSIS — Z87448 Personal history of other diseases of urinary system: Secondary | ICD-10-CM

## 2023-10-31 MED ORDER — EMPAGLIFLOZIN 10 MG PO TABS: 10.0000 mg | ORAL_TABLET | Freq: Every day | ORAL | 6 refills | Status: DC

## 2023-10-31 MED ORDER — FUROSEMIDE 40 MG PO TABS: 40.0000 mg | ORAL_TABLET | Freq: Every day | ORAL | 6 refills | Status: DC

## 2023-10-31 NOTE — Patient Instructions (Addendum)
Medication Instructions:   Begin Jardiance 10mg  daily  Continue Furosemide at 40mg  daily, but may take an extra 40mg  as needed for weight gain, shortness of breath or leg swelling  Continue all other medications.     Labwork:  BMET - order given today Please do in 1-2 weeks  Office will contact with results via phone, letter or mychart.     Testing/Procedures:  none  Follow-Up:  4-6 weeks    Any Other Special Instructions Will Be Listed Below (If Applicable).  CHF info given today.   If you need a refill on your cardiac medications before your next appointment, please call your pharmacy.

## 2023-10-31 NOTE — Progress Notes (Signed)
Cardiology Office Note:  .   Date:  10/31/2023 ID:  Renee Dudley, DOB June 12, 1936, MRN [id number] PCP: Estanislado Pandy, MD  [facility] Providers Cardiologist:  Nona Dell, MD    History of Present Illness: [name]   Renee Dudley is a 88 y.o. female with PMH of HFpEF, nonobstructive CAD, exertional shortness of breath, mixed HLD, bilateral leg edema and lymphedema, hypertension, history of renal insufficiency, who presents today for hospital follow-up.  Previous cardiovascular history includes cardiac catheterization in 2007 that revealed nonobstructive CAD.  Myoview in 2021 was low risk, no ischemia noted with EF 72%.   Last seen by Dr. Diona Browner on December 01, 2022.  Was doing well, did note worsening leg swelling/lymphedema.  Dr. Diona Browner discussed using Lasix 60 mg for few days at a time, recommended elevating her legs.   Admitted May 2024 with intermittent chest pain.  Was ruled out for ACS.  Stress test showed nondiagnostic EKG and subsequent imaging was negative for reversible ischemia. TTE EF 65 to 70%, grade 1 DD.  Since I last saw her in the office, she visited [facility] on August 02, 2023 for leg swelling and shortness of breath.  She stated at the time of ED visit that she did not take her medication.  Described DOE earlier in the day, felt better during time of ED arrival.  Denied any chest pain.  Diuresed more than a liter at the time.  Was discharged after diuresis.  08/15/2023 - Today she presents for scheduled follow-up.  She says that her leg swelling has been getting worse recently, says her RLS is bothersome to her.  Confirms with me that she is not taking Entresto as previously prescribed and continues to take losartan only. Denies any chest pain, shortness of breath, palpitations, syncope, presyncope, dizziness, orthopnea, PND, or significant weight changes, acute bleeding, or claudication.  Says she was going to pick something up recently and felt the right  side of her rib cage pop, does have history of frozen shoulder along right side, has received injections for this in the past, says she has not had any on the left side.  10/31/2023 - Presents today for follow-up. Admits to stable leg edema. She has been having recent issues with her back and scheduled to see a surgeon next week. Denies any chest pain, palpitations, syncope, presyncope, dizziness, orthopnea, PND, or significant weight changes, acute bleeding, or claudication. Does admit to some shortness of breath with exertion.   ROS: Negative.  See HPI.  Studies Reviewed: [name]       Myoview 01/2023 [facility]): 1. No reversible ischemia . 2. Normal left ventricular wall motion. 3. Left ventricular ejection fraction 76% 4. Non invasive risk stratification*: Low *2012 Appropriate Use Criteria for Coronary Revascularization Focused Update: J Am Coll Cardiol. 2012;59(9):857-881. http://content.[email or web address]?[email or web address] Electronically Signed By: Genevive Bi M.D. On: 01/19/2023 15:31    Echo 01/2023 [facility]):  The left ventricle is normal in size with upper normal wall thickness. 2. The left ventricular systolic function is normal, LVEF is visually estimated at 65-70%. 3. There is grade I diastolic dysfunction (impaired relaxation). 4. There is mild aortic regurgitation. 5. The left atrium is mildly dilated in size. 6. The right ventricle is normal in size, with normal systolic function. Left Ventricle The left ventricle is normal in size with upper normal wall thickness. The left ventricular systolic function is normal, LVEF is visually estimated at 65-70%. There is grade I diastolic dysfunction (impaired relaxation). Right  Ventricle The right ventricle is normal in size, with normal systolic function. Left Atrium The left atrium is mildly dilated in size. Right Atrium The right atrium is upper normal in size. Aortic Valve The aortic valve is trileaflet with mildly thickened  leaflets with normal excursion. There is mild aortic regurgitation. There is no evidence of a significant transvalvular gradient. Mitral Valve The mitral valve leaflets are normal with normal leaflet mobility. Mitral annular calcification is present. There is trivial mitral valve regurgitation. Tricuspid Valve The tricuspid valve leaflets are normal, with normal leaflet mobility. There is trivial tricuspid regurgitation. The pulmonary systolic pressure cannot be estimated due to insufficient TR signal. Pulmonic Valve The pulmonic valve is normal. There is mild pulmonic regurgitation. There is no evidence of a significant transvalvular gradient. Aorta The aorta is normal in size in the visualized segments. Inferior Vena Cava IVC size and inspiratory change suggest normal right atrial pressure. (0-5 mmHg). Pericardium/Pleural There is no pericardial effusion.    Physical Exam:   VS:  BP 114/78   Pulse 78   Ht 5\' 6"  (1.676 m)   Wt 256 lb (116.1 kg)   SpO2 99%   BMI 41.32 kg/m    Wt Readings from Last 3 Encounters:  10/31/23 256 lb (116.1 kg)  08/15/23 251 lb 9.6 oz (114.1 kg)  03/08/23 247 lb (112 kg)    GEN: Morbidly obese female in no acute distress NECK: No JVD; No carotid bruits CARDIAC: S1/S2, RRR, no murmurs, rubs, gallops RESPIRATORY: Clear and diminished, no adventitious breath sounds heard on exam  EXTREMITIES:  Nonpitting lymphedema to BLE; No deformity   ASSESSMENT AND PLAN: .    HFpEF, lymphedema, medication management Etiology most likely nonischemic. Stage C, NYHA class II-III symptoms. Echo at [facility] showed normal EF.  Does notice some stable leg edema. Will begin Jardiance 10 mg daily and repeat BMET in 1-2 weeks.  Continue rest of medication regimen.  Instructed that she may take an extra 40 mg of Lasix daily if she has increased SHOB, weight gain, or leg edema.  Low sodium diet, fluid restriction <2L, and daily weights encouraged. Educated to contact our office for weight gain  of 2 lbs overnight or 5 lbs in one week.   Non-obstructive CAD Stable with no anginal symptoms. No indication for ischemic evaluation. NST at [facility] noted above was negative for ischemia. Continue ASA, imdur, rosuvastatin. Heart healthy diet and regular cardiovascular exercise encouraged.    Mixed HLD Continue rosuvastatin, will request most recent labs from Dr. Dian Situ office at next office visit.  Heart healthy diet and regular cardiovascular exercise encouraged.    Lymphedema Stable leg edema.  Previously discussed lymphedema clinic referral, however politely declined. Continue Lasix.    HTN BP stable. Discussed to monitor BP at home at least 2 hours after medications and sitting for 5-10 minutes. No medication changes at this time. Heart healthy diet and regular cardiovascular exercise encouraged.    Hx of renal insufficiency  Not addressed but at next office visit, will request most recent labs from PCP's office.  Avoid nephrotoxic agents. Continue to follow with PCP.   Dispo: Follow-up with Dr. Diona Browner or APP in 4-6 weeks or sooner if anything changes.  Signed, Sharlene Dory, NP

## 2023-12-12 ENCOUNTER — Encounter (INDEPENDENT_AMBULATORY_CARE_PROVIDER_SITE_OTHER): Payer: Self-pay | Admitting: *Deleted

## 2023-12-15 ENCOUNTER — Encounter: Payer: Self-pay | Admitting: Nurse Practitioner

## 2023-12-15 ENCOUNTER — Ambulatory Visit: Payer: Medicare HMO | Attending: Nurse Practitioner | Admitting: Nurse Practitioner

## 2023-12-15 VITALS — BP 142/88 | HR 92 | Ht 66.0 in | Wt 259.8 lb

## 2023-12-15 DIAGNOSIS — I480 Paroxysmal atrial fibrillation: Secondary | ICD-10-CM | POA: Diagnosis not present

## 2023-12-15 DIAGNOSIS — I2510 Atherosclerotic heart disease of native coronary artery without angina pectoris: Secondary | ICD-10-CM

## 2023-12-15 DIAGNOSIS — N183 Chronic kidney disease, stage 3 unspecified: Secondary | ICD-10-CM

## 2023-12-15 DIAGNOSIS — I10 Essential (primary) hypertension: Secondary | ICD-10-CM | POA: Diagnosis not present

## 2023-12-15 DIAGNOSIS — I5032 Chronic diastolic (congestive) heart failure: Secondary | ICD-10-CM

## 2023-12-15 DIAGNOSIS — Z01810 Encounter for preprocedural cardiovascular examination: Secondary | ICD-10-CM

## 2023-12-15 DIAGNOSIS — E782 Mixed hyperlipidemia: Secondary | ICD-10-CM

## 2023-12-15 DIAGNOSIS — I890 Lymphedema, not elsewhere classified: Secondary | ICD-10-CM

## 2023-12-15 NOTE — Patient Instructions (Addendum)
 Medication Instructions:  Your physician recommends that you continue on your current medications as directed. Please refer to the Current Medication list given to you today.  Labwork: In 1 week at [facility]   Testing/Procedures: None   Follow-Up: Your physician recommends that you schedule a follow-up appointment in: 6 weeks   Any Other Special Instructions Will Be Listed Below (If Applicable).  If you need a refill on your cardiac medications before your next appointment, please call your pharmacy.

## 2023-12-15 NOTE — Progress Notes (Unsigned)
 Cardiology Office Note:  .   Date:  12/15/2023 ID:  Renee Dudley, DOB Jun 15, 1936, MRN [id number] PCP: Estanislado Pandy, MD  [facility] Providers Cardiologist:  Nona Dell, MD    History of Present Illness: Renee Kitchen   Renee Dudley is a 88 yDudleyo. female with PMH of HFpEF, nonobstructive CAD, exertional shortness of breath, mixed HLD, bilateral leg edema and lymphedema, hypertension, history of renal insufficiency, who presents today for hospital follow-up.  Previous cardiovascular history includes cardiac catheterization in 2007 that revealed nonobstructive CAD.  Myoview in 2021 was low risk, no ischemia noted with EF 72%.   Last seen by Dr. Diona Browner on December 01, 2022.  Was doing well, did note worsening leg swelling/lymphedema.  Dr. Diona Browner discussed using Lasix 60 mg for few days at a time, recommended elevating her legs.   Admitted May 2024 with intermittent chest pain.  Was ruled out for ACS.  Stress test showed nondiagnostic EKG and subsequent imaging was negative for reversible ischemia. TTE EF 65 to 70%, grade 1 DD.  Since I last saw her in the office, she visited [facility] on August 02, 2023 for leg swelling and shortness of breath.  She stated at the time of ED visit that she did not take her medication.  Described DOE earlier in the day, felt better during time of ED arrival.  Denied any chest pain.  Diuresed more than a liter at the time.  Was discharged after diuresis.  08/15/2023 - Today she presents for scheduled follow-up.  She says that her leg swelling has been getting worse recently, says her RLS is bothersome to her.  Confirms with me that she is not taking Entresto as previously prescribed and continues to take losartan only. Denies any chest pain, shortness of breath, palpitations, syncope, presyncope, dizziness, orthopnea, PND, or significant weight changes, acute bleeding, or claudication.  Says she was going to pick something up recently and felt the right  side of her rib cage pop, does have history of frozen shoulder along right side, has received injections for this in the past, says she has not had any on the left side.  10/31/2023 - Presents today for follow-up. Admits to stable leg edema. She has been having recent issues with her back and scheduled to see a surgeon next week. Denies any chest pain, palpitations, syncope, presyncope, dizziness, orthopnea, PND, or significant weight changes, acute bleeding, or claudication. Does admit to some shortness of breath with exertion.   Recent admission to [facility] - weakness, SOB, tx for UTI by PCP because of hematuria. Tx for sepsis d/t UTI, AKI, elevated trop, also noted to have hypokalemia. New onset of A-fib with RVR. Cardiology consulted. Was noted not to start DOAC's d/t hx of melena. Echo in hospital revealed revealed EF 65-70&, grade 1 DD, mild AR. Favored Diltiazem for rate control. Monitor was arranged and worn by patient for 2 weeks - see report below.   12/15/2023 - Today she presents for follow-up. Tells me she is no longer taking Jardiance as she has been tx for UTI. Admits to worsening leg edema. Son is present in room and states she is not consistent sometimes with taking her fluid pill. She denies any chest pain, palpitations, syncope, presyncope, dizziness, orthopnea, PND, swelling or significant weight changes, acute bleeding, or claudication. Does notice some DOE. Tells me she is pending colonoscopy with GI.   ROS: Negative.  See HPI.  Studies Reviewed: Renee Kitchen    EKG: EKG  Interpretation Date/Time:  Thursday December 15 2023 14:37:38 EDT Ventricular Rate:  92 PR Interval:  172 QRS Duration:  94 QT Interval:  346 QTC Calculation: 427 R Axis:   -30  Text Interpretation: Normal sinus rhythm Left axis deviation Anterior infarct , age undetermined When compared with ECG of 08-Mar-2020 17:50, PREVIOUS ECG IS PRESENT Confirmed by Renee Dudley [phone]) on 12/15/2023 2:54:41 PM   Cardiac  monitor [facility] [place]) 11/2023:  Ambulatory ECG monitoring was performed from 11/15/23 to 11/29/23.  - The predominant rhythm was sinus rhythm, with the rate ranging from 44  to 103 and averaging 74 bpm when in sinus rhythm  - 3% burden of atrial fibrillation observed, longest episode 10 hours 9  minutes.  Heart rate range 77-173 with average heart rate 115 bpm while in  the arrhythmia  - Occasional supraventricular ectopics (PACs) were recorded (1Dudley0% burden)  with 29 episodes of SVT, longest lasting 6 beats at 153 bpm  - Rare ventricular ectopics (PVCs) were recorded with no recorded episodes  of wide-complex tachycardia  - No patient-initiated recordings/events were submitted.  - No pauses >3 seconds or high-grade AV block detected   Myoview 01/2023 [facility]): 1. No reversible ischemia . 2. Normal left ventricular wall motion. 3. Left ventricular ejection fraction 76% 4. Non invasive risk stratification*: Low *2012 Appropriate Use Criteria for Coronary Revascularization Focused Update: J Am Coll Cardiol. 2012;59(9):857-881. [email or web address] Electronically Signed By: Renee Dudley MDudleyD. On: 01/19/2023 15:31    Echo 01/2023 [facility]):  The left ventricle is normal in size with upper normal wall thickness. 2. The left ventricular systolic function is normal, LVEF is visually estimated at 65-70%. 3. There is grade I diastolic dysfunction (impaired relaxation). 4. There is mild aortic regurgitation. 5. The left atrium is mildly dilated in size. 6. The right ventricle is normal in size, with normal systolic function. Left Ventricle The left ventricle is normal in size with upper normal wall thickness. The left ventricular systolic function is normal, LVEF is visually estimated at 65-70%. There is grade I diastolic dysfunction (impaired relaxation). Right Ventricle The right ventricle is normal in size, with normal systolic function. Left Atrium The  left atrium is mildly dilated in size. Right Atrium The right atrium is upper normal in size. Aortic Valve The aortic valve is trileaflet with mildly thickened leaflets with normal excursion. There is mild aortic regurgitation. There is no evidence of a significant transvalvular gradient. Mitral Valve The mitral valve leaflets are normal with normal leaflet mobility. Mitral annular calcification is present. There is trivial mitral valve regurgitation. Tricuspid Valve The tricuspid valve leaflets are normal, with normal leaflet mobility. There is trivial tricuspid regurgitation. The pulmonary systolic pressure cannot be estimated due to insufficient TR signal. Pulmonic Valve The pulmonic valve is normal. There is mild pulmonic regurgitation. There is no evidence of a significant transvalvular gradient. Aorta The aorta is normal in size in the visualized segments. Inferior Vena Cava IVC size and inspiratory change suggest normal right atrial pressure. (0-5 mmHg). Pericardium/Pleural There is no pericardial effusion.    Physical Exam:   VS:  BP (!) 142/88   Pulse 92   Ht 5\' 6"  (1Dudley676 m)   Wt 259 lb 12Dudley8 oz (117Dudley8 kg)   SpO2 97%   BMI 41Dudley93 kg/m    Wt Readings from Last 3 Encounters:  12/15/23 259 lb 12Dudley8 oz (117Dudley8 kg)  10/31/23 256 lb (116Dudley1 kg)  08/15/23 251 lb 9Dudley6 oz (114Dudley1 kg)    GEN: Morbidly  obese female in no acute distress NECK: No JVD; No carotid bruits CARDIAC: S1/S2, RRR, no murmurs, rubs, gallops RESPIRATORY: Clear and diminished, no adventitious breath sounds heard on exam  EXTREMITIES:  Nonpitting lymphedema to BLE (appears more pronounced since last office visit), RLE slightly more increased from LLE; No deformity   ASSESSMENT AND PLAN: .    HFpEF, lymphedema Etiology most likely nonischemic. Stage C, NYHA class II-III symptoms. Echo at [facility] during recent hospitalization showed normal EF, grade 1 DD, mild AR. Does notice some worsening leg edema, low suspicion for DVT. GDMT  limited, could not tolerate [place] - caused UTI.  Continue rest of medication regimen.  Low sodium diet, fluid restriction <2L, and daily weights encouraged. Educated to contact our office for weight gain of 2 lbs overnight or 5 lbs in one week. Will obtain BMET, proBNP, and Mag level prior to medication adjustment. If kidney function permits, plan to uptitrate Lasix. Offered lymphedema clinic referral, pt declines. Will fax note over to PCP to consider [facility] referral for RN to perform leg wraps at home.   2. New onset A-fib/PAF Newly dx during most recent hospital stay at [facility]. In setting of recent hospitalization for sepsis from UTI. Most likely recent illness triggered her A-fib. Most recent monitor arranged revealed predominantly sinus rhythm with 3% A-fib burden.  Longest episode lasted a little over 10 hours. Was noted not to start DOAC's d/t hx of melena.  Will continue to hold off DOAC due to history of melena-she is pending colonoscopy with GI soon.  She denies any tachycardia or palpitations.  Heart rate is well-controlled despite not being on AV nodal blockers.  Will continue to monitor for now.  At next visit, plan to consider starting low-dose metoprolol.  Care and ED precautions discussed.  Non-obstructive CAD Stable with no anginal symptoms. No indication for ischemic evaluation. NST at [facility] noted above was negative for ischemia. Continue ASA, imdur, rosuvastatin, and NTG PRN. Heart healthy diet and regular cardiovascular exercise encouraged.    Mixed HLD Will request most recent labs from PCP's office. Continue rosuvastatin.  Heart healthy diet and regular cardiovascular exercise encouraged.   4. HTN BP mildly elevated on arrival today, BP well controlled at home. Discussed to monitor BP at home at least 2 hours after medications and sitting for 5-10 minutes. No medication changes at this time. Heart healthy diet and regular cardiovascular exercise encouraged.    CKD stage 3, recent  AKI Most recent sCr on file is 1Dudley54. Will obtain labs as mentioned above. Will place referral to Nephrology. Avoid nephrotoxic agents. Could not tolerate SGLT2i. Continue to follow with PCP.  7. Pre-operative cardiovascular risk assessment Ms. Orgeron's perioperative risk of a major cardiac event is 6Dudley6% according to the Revised Cardiac Risk Index (RCRI).  Therefore, she is at high risk for perioperative complications.   Her functional capacity is good at   METs according to the Duke Activity Status Index (DASI). Recommendations: According to ACC/AHA guidelines, no further cardiovascular testing needed.  The patient may proceed to surgery at acceptable risk.   Antiplatelet and/or Anticoagulation Recommendations: The patient should remain on Aspirin without interruption. She is currently not on an anticoagulant that needs to be held prior to procedure.   Dispo: Follow-up with Dr. Diona Browner or APP in 6 weeks or sooner if anything changes.  Signed, Renee Dory, NP

## 2023-12-16 ENCOUNTER — Encounter: Payer: Self-pay | Admitting: Family Medicine

## 2023-12-22 ENCOUNTER — Other Ambulatory Visit: Payer: Self-pay | Admitting: Nurse Practitioner

## 2023-12-22 DIAGNOSIS — N183 Chronic kidney disease, stage 3 unspecified: Secondary | ICD-10-CM

## 2023-12-22 DIAGNOSIS — I2510 Atherosclerotic heart disease of native coronary artery without angina pectoris: Secondary | ICD-10-CM

## 2023-12-22 DIAGNOSIS — I5032 Chronic diastolic (congestive) heart failure: Secondary | ICD-10-CM

## 2023-12-22 MED ORDER — FUROSEMIDE 40 MG PO TABS: 80.0000 mg | ORAL_TABLET | Freq: Every day | ORAL | 0 refills | Status: DC

## 2023-12-24 ENCOUNTER — Other Ambulatory Visit: Payer: Self-pay | Admitting: Cardiology

## 2024-01-05 ENCOUNTER — Encounter (INDEPENDENT_AMBULATORY_CARE_PROVIDER_SITE_OTHER): Payer: Self-pay | Admitting: Gastroenterology

## 2024-01-05 ENCOUNTER — Ambulatory Visit (INDEPENDENT_AMBULATORY_CARE_PROVIDER_SITE_OTHER): Admitting: Gastroenterology

## 2024-01-05 VITALS — BP 119/74 | HR 106 | Temp 97.1°F | Ht 66.0 in | Wt 256.0 lb

## 2024-01-05 DIAGNOSIS — R195 Other fecal abnormalities: Secondary | ICD-10-CM

## 2024-01-05 DIAGNOSIS — D649 Anemia, unspecified: Secondary | ICD-10-CM

## 2024-01-05 MED ORDER — PEG 3350-KCL-NA BICARB-NACL 420 G PO SOLR
4000.0000 mL | Freq: Once | ORAL | 0 refills | Status: AC
Start: 2024-01-05 — End: 2024-01-05

## 2024-01-05 NOTE — Patient Instructions (Signed)
 Schedule colonoscopy

## 2024-01-05 NOTE — Progress Notes (Unsigned)
 [name], M.D. Gastroenterology & Hepatology [facility] Gastroenterology [address] [place], [place] [address] Primary Care Physician: Orest Bio, MD [address] [place] [address]  Referring MD: PCP  Chief Complaint: Positive fecal occult blood  History of Present Illness: Renee Dudley is a 88 y.o. female with past medical history of CHF, depression, GERD, A-fib, gout, hyperlipidemia, nonobstructive coronary artery disease, who presents for evaluation of positive fecal occult blood.  The patient denies having any nausea, vomiting, fever, chills, hematochezia, melena, hematemesis, abdominal distention, abdominal pain, diarrhea, jaundice, pruritus or weight loss.  Recently seen by cardiology who did not consider any further testing prior to undergoing any procedure as the patient is clinically optimized from the cardiovascular perspective.  Most recent Hb was 11.2 on 11/15/2023. Had positive Hemosure on 12/06/23  Last WGN:FAOZH Last Colonoscopy:possibly had it 20 years ago. No report available, patient does not know what was found. No reports available.  FHx: neg for any gastrointestinal/liver disease, no malignancies Social: neg smoking, alcohol  or illicit drug use Surgical: no abdominal surgeries  Past Medical History: Past Medical History:  Diagnosis Date   Anxiety    Chronic headaches    Coronary atherosclerosis of native coronary artery    Nonobstructive at cardiac catheterization 2007    Depression    DJD (degenerative joint disease)    Essential hypertension    GERD (gastroesophageal reflux disease)    History of gout    Mixed hyperlipidemia    Peripheral edema    Primary localized osteoarthritis of left knee    Renal insufficiency    Varicose veins     Past Surgical History: Past Surgical History:  Procedure Laterality Date   ANTERIOR LAT LUMBAR FUSION N/A 07/01/2021   Procedure: Anterior lateral lumbar  fusion - Lateral two-lateral three - Interbody Fusion;  Surgeon: Gearl Keens, MD;  Location: [facility] OR;  Service: Neurosurgery;  Laterality: N/A;   APPENDECTOMY     Cataract surgery Bilateral    CHOLECYSTECTOMY  1986   COLONOSCOPY     LUMBAR LAMINECTOMY/DECOMPRESSION MICRODISCECTOMY Right 02/23/2017   Procedure: Right Lumbar Two-Three Microdiscectomy;  Surgeon: Gearl Keens, MD;  Location: [facility] OR;  Service: Neurosurgery;  Laterality: Right;   LUMBAR PERCUTANEOUS PEDICLE SCREW 1 LEVEL N/A 07/01/2021   Procedure: LUMBAR PERCUTANEOUS PEDICLE SCREW 1 LEVEL;  Surgeon: Gearl Keens, MD;  Location: [facility] OR;  Service: Neurosurgery;  Laterality: N/A;   Right total knee arthroplasty  2011   TOTAL ABDOMINAL HYSTERECTOMY W/ BILATERAL SALPINGOOPHORECTOMY  1988   TOTAL KNEE ARTHROPLASTY Left 12/23/2014   Procedure: LEFT TOTAL KNEE ARTHROPLASTY;  Surgeon: Elly Habermann, MD;  Location: [facility] OR;  Service: Orthopedics;  Laterality: Left;    Family History: Family History  Problem Relation Age of Onset   Stroke Sister        Died in her 49's   Stroke Father        Died age 34   Heart failure Mother        Died age 10   Stroke Mother     Social History: Social History   Tobacco Use  Smoking Status Never   Passive exposure: Never  Smokeless Tobacco Never   Social History   Substance and Sexual Activity  Alcohol  Use No   Alcohol /week: 0.0 standard drinks of alcohol    Social History   Substance and Sexual Activity  Drug Use No    Allergies: Allergies  Allergen Reactions   Penicillins  Other (See Comments)    Pt can remember, happened 50 years ago Has patient had a PCN reaction causing immediate rash, facial/tongue/throat swelling, SOB or lightheadedness with hypotension: Unknown Has patient had a PCN reaction causing severe rash involving mucus membranes or skin necrosis: Unknown Has patient had a PCN reaction that required hospitalization:was inpatient at time of reaction Has patient had a PCN  reaction occurring within the last 10 years: No If all of the above answers are "NO", then may proceed with C   Levofloxacin     Weakness    Prednisone     Zocor [Simvastatin]     UNSPECIFIED REACTION    Sulfa Antibiotics Nausea And Vomiting    Medications: Current Outpatient Medications  Medication Sig Dispense Refill   aspirin  EC 81 MG tablet Take 81 mg by mouth daily. Swallow whole.     famotidine  (PEPCID ) 20 MG tablet Take 20 mg by mouth daily.     furosemide  (LASIX ) 40 MG tablet Take 2 tablets (80 mg total) by mouth daily. 180 tablet 0   isosorbide  mononitrate (IMDUR ) 30 MG 24 hr tablet TAKE 1/2 TABLET BY MOUTH DAILY. NEED APPT 45 tablet 3   losartan  (COZAAR ) 25 MG tablet Take 25 mg by mouth daily.     Multiple Vitamins-Minerals (PRESERVISION AREDS 2 PO) Take 1 capsule by mouth daily.     nitroGLYCERIN  (NITROSTAT ) 0.4 MG SL tablet Place 0.4 mg under the tongue every 5 (five) minutes x 3 doses as needed for chest pain (if no relief after 3rd dose, proceed to ED or call 911).     rosuvastatin  (CRESTOR ) 5 MG tablet TAKE 1 TABLET (5 MG TOTAL) BY MOUTH DAILY. 90 tablet 3   sertraline  (ZOLOFT ) 50 MG tablet Take 75 mg by mouth daily.     traMADol (ULTRAM) 50 MG tablet Take 50 mg by mouth 3 (three) times daily as needed.     No current facility-administered medications for this visit.    Review of Systems: GENERAL: negative for malaise, night sweats HEENT: No changes in hearing or vision, no nose bleeds or other nasal problems. NECK: Negative for lumps, goiter, pain and significant neck swelling RESPIRATORY: Negative for cough, wheezing CARDIOVASCULAR: Negative for chest pain, leg swelling, palpitations, orthopnea GI: SEE HPI MUSCULOSKELETAL: Negative for joint pain or swelling, back pain, and muscle pain. SKIN: Negative for lesions, rash PSYCH: Negative for sleep disturbance, mood disorder and recent psychosocial stressors. HEMATOLOGY Negative for prolonged bleeding, bruising  easily, and swollen nodes. ENDOCRINE: Negative for cold or heat intolerance, polyuria, polydipsia and goiter. NEURO: negative for tremor, gait imbalance, syncope and seizures. The remainder of the review of systems is noncontributory.   Physical Exam: BP 119/74 (BP Location: Left Arm, Patient Position: Sitting, Cuff Size: Normal)   Pulse (!) 106   Temp (!) 97.1 F (36.2 C) (Temporal)   Ht 5\' 6"  (1.676 m)   Wt 256 lb (116.1 kg)   BMI 41.32 kg/m  GENERAL: The patient is AO x3, in no acute distress. HEENT: Head is normocephalic and atraumatic. EOMI are intact. Mouth is well hydrated and without lesions. NECK: Supple. No masses LUNGS: Clear to auscultation. No presence of rhonchi/wheezing/rales. Adequate chest expansion HEART: RRR, normal s1 and s2. ABDOMEN: Soft, nontender, no guarding, no peritoneal signs, and nondistended. BS +. No masses. EXTREMITIES: Without any cyanosis, clubbing, rash, lesions. Bilateral nonpitting edema up to mid thigh. NEUROLOGIC: AOx3, no focal motor deficit. SKIN: no jaundice, no rashes   Imaging/Labs: as above  I  personally reviewed and interpreted the available labs, imaging and endoscopic files.  Impression and Plan: Renee Dudley is a 88 y.o. female with past medical history of CHF, depression, GERD, A-fib, gout, hyperlipidemia, nonobstructive coronary artery disease, who presents for evaluation of positive fecal occult blood. Patient had positive  FOBT as part of the evaluation of mild asymptomatic anemia. He has not presented other symptoms or red flag signs. We discussed evaluating her abnormal stool testing with a colonoscopy, which she is agreeable to proceed with.  -Schedule colonoscopy  All questions were answered.      [name], MD Gastroenterology and Hepatology [facility] Gastroenterology

## 2024-01-05 NOTE — H&P (View-Only) (Signed)
 Renee Dudley, M.D. Gastroenterology & Hepatology [facility] Gastroenterology [address] [place], [place] [address] Primary Care Physician: Orest Bio, MD [address] [place] [address]  Referring MD: PCP  Chief Complaint: Positive fecal occult blood  History of Present Illness: Renee Dudley is a 88 y.o. female with past medical history of CHF, depression, GERD, A-fib, gout, hyperlipidemia, nonobstructive coronary artery disease, who presents for evaluation of positive fecal occult blood.  The patient denies having any nausea, vomiting, fever, chills, hematochezia, melena, hematemesis, abdominal distention, abdominal pain, diarrhea, jaundice, pruritus or weight loss.  Recently seen by cardiology who did not consider any further testing prior to undergoing any procedure as the patient is clinically optimized from the cardiovascular perspective.  Most recent Hb was 11.2 on 11/15/2023. Had positive Hemosure on 12/06/23  Last UJW:JXBJY Last Colonoscopy:possibly had it 20 years ago. No report available, patient does not know what was found. No reports available.  FHx: neg for any gastrointestinal/liver disease, no malignancies Social: neg smoking, alcohol  or illicit drug use Surgical: no abdominal surgeries  Past Medical History: Past Medical History:  Diagnosis Date   Anxiety    Chronic headaches    Coronary atherosclerosis of native coronary artery    Nonobstructive at cardiac catheterization 2007    Depression    DJD (degenerative joint disease)    Essential hypertension    GERD (gastroesophageal reflux disease)    History of gout    Mixed hyperlipidemia    Peripheral edema    Primary localized osteoarthritis of left knee    Renal insufficiency    Varicose veins     Past Surgical History: Past Surgical History:  Procedure Laterality Date   ANTERIOR LAT LUMBAR FUSION N/A 07/01/2021   Procedure: Anterior lateral lumbar  fusion - Lateral two-lateral three - Interbody Fusion;  Surgeon: Gearl Keens, MD;  Location: [facility] OR;  Service: Neurosurgery;  Laterality: N/A;   APPENDECTOMY     Cataract surgery Bilateral    CHOLECYSTECTOMY  1986   COLONOSCOPY     LUMBAR LAMINECTOMY/DECOMPRESSION MICRODISCECTOMY Right 02/23/2017   Procedure: Right Lumbar Two-Three Microdiscectomy;  Surgeon: Gearl Keens, MD;  Location: [facility] OR;  Service: Neurosurgery;  Laterality: Right;   LUMBAR PERCUTANEOUS PEDICLE SCREW 1 LEVEL N/A 07/01/2021   Procedure: LUMBAR PERCUTANEOUS PEDICLE SCREW 1 LEVEL;  Surgeon: Gearl Keens, MD;  Location: [facility] OR;  Service: Neurosurgery;  Laterality: N/A;   Right total knee arthroplasty  2011   TOTAL ABDOMINAL HYSTERECTOMY W/ BILATERAL SALPINGOOPHORECTOMY  1988   TOTAL KNEE ARTHROPLASTY Left 12/23/2014   Procedure: LEFT TOTAL KNEE ARTHROPLASTY;  Surgeon: Elly Habermann, MD;  Location: [facility] OR;  Service: Orthopedics;  Laterality: Left;    Family History: Family History  Problem Relation Age of Onset   Stroke Sister        Died in her 8's   Stroke Father        Died age 15   Heart failure Mother        Died age 21   Stroke Mother     Social History: Social History   Tobacco Use  Smoking Status Never   Passive exposure: Never  Smokeless Tobacco Never   Social History   Substance and Sexual Activity  Alcohol  Use No   Alcohol /week: 0.0 standard drinks of alcohol    Social History   Substance and Sexual Activity  Drug Use No    Allergies: Allergies  Allergen Reactions   Penicillins  Other (See Comments)    Pt can remember, happened 50 years ago Has patient had a PCN reaction causing immediate rash, facial/tongue/throat swelling, SOB or lightheadedness with hypotension: Unknown Has patient had a PCN reaction causing severe rash involving mucus membranes or skin necrosis: Unknown Has patient had a PCN reaction that required hospitalization:was inpatient at time of reaction Has patient had a PCN  reaction occurring within the last 10 years: No If all of the above answers are "NO", then may proceed with C   Levofloxacin     Weakness    Prednisone     Zocor [Simvastatin]     UNSPECIFIED REACTION    Sulfa Antibiotics Nausea And Vomiting    Medications: Current Outpatient Medications  Medication Sig Dispense Refill   aspirin  EC 81 MG tablet Take 81 mg by mouth daily. Swallow whole.     famotidine  (PEPCID ) 20 MG tablet Take 20 mg by mouth daily.     furosemide  (LASIX ) 40 MG tablet Take 2 tablets (80 mg total) by mouth daily. 180 tablet 0   isosorbide  mononitrate (IMDUR ) 30 MG 24 hr tablet TAKE 1/2 TABLET BY MOUTH DAILY. NEED APPT 45 tablet 3   losartan  (COZAAR ) 25 MG tablet Take 25 mg by mouth daily.     Multiple Vitamins-Minerals (PRESERVISION AREDS 2 PO) Take 1 capsule by mouth daily.     nitroGLYCERIN  (NITROSTAT ) 0.4 MG SL tablet Place 0.4 mg under the tongue every 5 (five) minutes x 3 doses as needed for chest pain (if no relief after 3rd dose, proceed to ED or call 911).     rosuvastatin  (CRESTOR ) 5 MG tablet TAKE 1 TABLET (5 MG TOTAL) BY MOUTH DAILY. 90 tablet 3   sertraline  (ZOLOFT ) 50 MG tablet Take 75 mg by mouth daily.     traMADol (ULTRAM) 50 MG tablet Take 50 mg by mouth 3 (three) times daily as needed.     No current facility-administered medications for this visit.    Review of Systems: GENERAL: negative for malaise, night sweats HEENT: No changes in hearing or vision, no nose bleeds or other nasal problems. NECK: Negative for lumps, goiter, pain and significant neck swelling RESPIRATORY: Negative for cough, wheezing CARDIOVASCULAR: Negative for chest pain, leg swelling, palpitations, orthopnea GI: SEE HPI MUSCULOSKELETAL: Negative for joint pain or swelling, back pain, and muscle pain. SKIN: Negative for lesions, rash PSYCH: Negative for sleep disturbance, mood disorder and recent psychosocial stressors. HEMATOLOGY Negative for prolonged bleeding, bruising  easily, and swollen nodes. ENDOCRINE: Negative for cold or heat intolerance, polyuria, polydipsia and goiter. NEURO: negative for tremor, gait imbalance, syncope and seizures. The remainder of the review of systems is noncontributory.   Physical Exam: BP 119/74 (BP Location: Left Arm, Patient Position: Sitting, Cuff Size: Normal)   Pulse (!) 106   Temp (!) 97.1 F (36.2 C) (Temporal)   Ht 5\' 6"  (1.676 m)   Wt 256 lb (116.1 kg)   BMI 41.32 kg/m  GENERAL: The patient is AO x3, in no acute distress. HEENT: Head is normocephalic and atraumatic. EOMI are intact. Mouth is well hydrated and without lesions. NECK: Supple. No masses LUNGS: Clear to auscultation. No presence of rhonchi/wheezing/rales. Adequate chest expansion HEART: RRR, normal s1 and s2. ABDOMEN: Soft, nontender, no guarding, no peritoneal signs, and nondistended. BS +. No masses. EXTREMITIES: Without any cyanosis, clubbing, rash, lesions. Bilateral nonpitting edema up to mid thigh. NEUROLOGIC: AOx3, no focal motor deficit. SKIN: no jaundice, no rashes   Imaging/Labs: as above  I  personally reviewed and interpreted the available labs, imaging and endoscopic files.  Impression and Plan: WALLY LEGERSKI is a 88 y.o. female with past medical history of CHF, depression, GERD, A-fib, gout, hyperlipidemia, nonobstructive coronary artery disease, who presents for evaluation of positive fecal occult blood. Patient had positive  FOBT as part of the evaluation of mild asymptomatic anemia. He has not presented other symptoms or red flag signs. We discussed evaluating her abnormal stool testing with a colonoscopy, which she is agreeable to proceed with.  -Schedule colonoscopy  All questions were answered.      Renee Cress, MD Gastroenterology and Hepatology [facility] Gastroenterology

## 2024-01-06 ENCOUNTER — Encounter (INDEPENDENT_AMBULATORY_CARE_PROVIDER_SITE_OTHER): Payer: Self-pay

## 2024-01-13 ENCOUNTER — Encounter (HOSPITAL_COMMUNITY): Payer: Self-pay | Admitting: Gastroenterology

## 2024-01-16 ENCOUNTER — Encounter (HOSPITAL_COMMUNITY)
Admission: RE | Admit: 2024-01-16 | Discharge: 2024-01-16 | Disposition: A | Discharge status: Home / Self Care | Source: Ambulatory Visit | Attending: Gastroenterology | Admitting: Gastroenterology

## 2024-01-17 ENCOUNTER — Encounter (INDEPENDENT_AMBULATORY_CARE_PROVIDER_SITE_OTHER): Payer: Self-pay | Admitting: *Deleted

## 2024-01-17 ENCOUNTER — Encounter (HOSPITAL_COMMUNITY)
Admission: RE | Disposition: A | Discharge status: Home / Self Care | Payer: Self-pay | Source: Home / Self Care | Attending: Gastroenterology

## 2024-01-17 ENCOUNTER — Other Ambulatory Visit: Payer: Self-pay

## 2024-01-17 ENCOUNTER — Encounter (HOSPITAL_COMMUNITY): Payer: Self-pay | Admitting: Gastroenterology

## 2024-01-17 ENCOUNTER — Ambulatory Visit (HOSPITAL_COMMUNITY): Admitting: Certified Registered Nurse Anesthetist

## 2024-01-17 ENCOUNTER — Ambulatory Visit (HOSPITAL_COMMUNITY)
Admission: RE | Admit: 2024-01-17 | Discharge: 2024-01-17 | Disposition: A | Discharge status: Home / Self Care | Attending: Gastroenterology | Admitting: Gastroenterology

## 2024-01-17 DIAGNOSIS — D120 Benign neoplasm of cecum: Secondary | ICD-10-CM | POA: Diagnosis not present

## 2024-01-17 DIAGNOSIS — I2510 Atherosclerotic heart disease of native coronary artery without angina pectoris: Secondary | ICD-10-CM | POA: Diagnosis not present

## 2024-01-17 DIAGNOSIS — K635 Polyp of colon: Secondary | ICD-10-CM

## 2024-01-17 DIAGNOSIS — Z1211 Encounter for screening for malignant neoplasm of colon: Secondary | ICD-10-CM | POA: Insufficient documentation

## 2024-01-17 DIAGNOSIS — M109 Gout, unspecified: Secondary | ICD-10-CM | POA: Diagnosis not present

## 2024-01-17 DIAGNOSIS — R195 Other fecal abnormalities: Secondary | ICD-10-CM | POA: Insufficient documentation

## 2024-01-17 DIAGNOSIS — D122 Benign neoplasm of ascending colon: Secondary | ICD-10-CM | POA: Insufficient documentation

## 2024-01-17 DIAGNOSIS — I10 Essential (primary) hypertension: Secondary | ICD-10-CM

## 2024-01-17 DIAGNOSIS — F32A Depression, unspecified: Secondary | ICD-10-CM | POA: Insufficient documentation

## 2024-01-17 DIAGNOSIS — I4891 Unspecified atrial fibrillation: Secondary | ICD-10-CM | POA: Insufficient documentation

## 2024-01-17 DIAGNOSIS — D123 Benign neoplasm of transverse colon: Secondary | ICD-10-CM | POA: Insufficient documentation

## 2024-01-17 DIAGNOSIS — K5140 Inflammatory polyps of colon without complications: Secondary | ICD-10-CM

## 2024-01-17 DIAGNOSIS — I509 Heart failure, unspecified: Secondary | ICD-10-CM | POA: Diagnosis not present

## 2024-01-17 DIAGNOSIS — D649 Anemia, unspecified: Secondary | ICD-10-CM | POA: Insufficient documentation

## 2024-01-17 DIAGNOSIS — K5730 Diverticulosis of large intestine without perforation or abscess without bleeding: Secondary | ICD-10-CM

## 2024-01-17 DIAGNOSIS — I110 Hypertensive heart disease with heart failure: Secondary | ICD-10-CM | POA: Insufficient documentation

## 2024-01-17 DIAGNOSIS — E782 Mixed hyperlipidemia: Secondary | ICD-10-CM | POA: Diagnosis not present

## 2024-01-17 DIAGNOSIS — K219 Gastro-esophageal reflux disease without esophagitis: Secondary | ICD-10-CM | POA: Insufficient documentation

## 2024-01-17 DIAGNOSIS — D125 Benign neoplasm of sigmoid colon: Secondary | ICD-10-CM | POA: Insufficient documentation

## 2024-01-17 HISTORY — PX: COLONOSCOPY: SHX5424

## 2024-01-17 LAB — HM COLONOSCOPY

## 2024-01-17 SURGERY — COLONOSCOPY
Anesthesia: Monitor Anesthesia Care

## 2024-01-17 MED ORDER — PROPOFOL 10 MG/ML IV BOLUS
INTRAVENOUS | Status: AC
  Filled 2024-01-17: qty 20

## 2024-01-17 MED ORDER — GLYCOPYRROLATE PF 0.2 MG/ML IJ SOSY: PREFILLED_SYRINGE | INTRAMUSCULAR | Status: DC | PRN

## 2024-01-17 MED ORDER — PROPOFOL 10 MG/ML IV BOLUS
INTRAVENOUS | Status: AC
Start: 2024-01-17 — End: ?
  Filled 2024-01-17: qty 20

## 2024-01-17 MED ORDER — SODIUM CHLORIDE (PF) 0.9 % IJ SOLN
PREFILLED_SYRINGE | INTRAMUSCULAR | Status: DC | PRN
  Administered 2024-01-17: 3 mL
  Administered 2024-01-17: 3.5 mL

## 2024-01-17 MED ORDER — METOPROLOL TARTRATE 5 MG/5ML IV SOLN
INTRAVENOUS | Status: DC | PRN
  Administered 2024-01-17 (×2): 1 mg via INTRAVENOUS

## 2024-01-17 MED ORDER — PROPOFOL 10 MG/ML IV BOLUS: INTRAVENOUS | Status: DC | PRN

## 2024-01-17 MED ORDER — GLYCOPYRROLATE PF 0.2 MG/ML IJ SOSY
PREFILLED_SYRINGE | INTRAMUSCULAR | Status: AC
  Filled 2024-01-17: qty 1

## 2024-01-17 MED ORDER — LIDOCAINE 2% (20 MG/ML) 5 ML SYRINGE
INTRAMUSCULAR | Status: AC
  Filled 2024-01-17: qty 5

## 2024-01-17 MED ORDER — PROPOFOL 10 MG/ML IV BOLUS
INTRAVENOUS | Status: DC | PRN
Start: 2024-01-17 — End: 2024-01-17
  Administered 2024-01-17 (×22): 50 mg via INTRAVENOUS

## 2024-01-17 MED ORDER — LIDOCAINE 2% (20 MG/ML) 5 ML SYRINGE
INTRAMUSCULAR | Status: DC | PRN
Start: 2024-01-17 — End: 2024-01-17
  Administered 2024-01-17: 60 mg via INTRAVENOUS

## 2024-01-17 MED ORDER — LACTATED RINGERS IV SOLN: INTRAVENOUS | Status: DC | PRN

## 2024-01-17 MED ORDER — EPINEPHRINE 1 MG/10ML IJ SOSY
PREFILLED_SYRINGE | INTRAMUSCULAR | Status: AC
  Filled 2024-01-17: qty 10

## 2024-01-17 NOTE — Op Note (Addendum)
 [facility] Patient Name: Renee Dudley Procedure Date: 01/17/2024 9:05 AM MRN: [id number] Date of Birth: 10-Jun-1936 Attending MD: [name] , , [id number] CSN: [id number] Age: 88 Admit Type: Outpatient Procedure:                Colonoscopy Indications:              Positive fecal occult test Providers:                [name], Vonna Guardian, Annell Barrow Referring MD:              Medicines:                Monitored Anesthesia Care Complications:            No immediate complications. Estimated Blood Loss:     Estimated blood loss: 100 mL. Procedure:                Pre-Anesthesia Assessment:                           - Prior to the procedure, a History and Physical                            was performed, and patient medications, allergies                            and sensitivities were reviewed. The patient's                            tolerance of previous anesthesia was reviewed.                           - The risks and benefits of the procedure and the                            sedation options and risks were discussed with the                            patient. All questions were answered and informed                            consent was obtained.                           - ASA Grade Assessment: II - A patient with mild                            systemic disease.                           After obtaining informed consent, the colonoscope                            was passed under direct vision. Throughout the                            procedure, the patient's blood pressure,  pulse, and                            oxygen saturations were monitored continuously. The                            PCF-HQ190L ([id number]) was introduced through the                            anus and advanced to the the cecum, identified by                            appendiceal orifice and ileocecal valve. The                            colonoscopy was performed without  difficulty. The                            patient tolerated the procedure well. The quality                            of the bowel preparation was good.                           22 Modifier was used because patient had multipkle                            polyps (>20) with tendency to bleed after                            polypectomy. Procedural time was one hour and 45                            minutes. Scope In: 9:28:42 AM Scope Out: 10:51:32 AM Scope Withdrawal Time: 0 hours 59 minutes 33 seconds  Total Procedure Duration: 1 hour 22 minutes 50 seconds  Findings:      The perianal and digital rectal examinations were normal.      Twenty three sessile polyps were found in the sigmoid colon, transverse       colon, ascending colon and cecum. The polyps were 2 to 10 mm in size.       These polyps were removed with a cold snare. Resection and retrieval       were complete. Area was successfully injected with 3.5 and 3 mL of a 0.1       mg/mL solution of epinephrine  for hemostasis of bleeding caused by the       procedure at two different polypectomy sites in teh ascending colon       given persistent bleeding. Upon completion of the polypectomy, these two       areas were covered with a layer of Purastat to prevent bleeding given       large amount of persistent bleeding. Some Purastat was applied at the       cecal polypectomy sites as well. To prevent bleeding after the       polypectomy, five hemostatic clips were successfully placed in 5  dfifferent polypectomy sites i the cecum and ascending colon. Clip       manufacturer: [employer]. There was no bleeding at the end of the       procedure.      Multiple sessile polyps were found in the entire colon (at least ten       more polyps). The polyps were 2 to 8 mm in size. No intervention was       performed at this time given severe bleeding tendency.      The retroflexed view of the distal rectum and anal verge was  normal and       showed no anal or rectal abnormalities. Impression:               - Twenty three 2 to 10 mm polyps in the sigmoid                            colon, in the transverse colon, in the ascending                            colon and in the cecum, removed with a cold snare.                            Resected and retrieved. Injected. Clips were                            placed. Clip manufacturer: [employer].                           - Multiple 2 to 8 mm polyps in the entire colon.                           - The distal rectum and anal verge are normal on                            retroflexion view. Moderate Sedation:      Per Anesthesia Care Recommendation:           - Discharge patient to home (ambulatory).                           - Full liquid diet for next 2 days, then resume                            previous diet.                           - Await pathology results.                           - Repeat colonoscopy in 6 months for retreatment.                           - Do not take any aspirin  (Goody/BC powders),                            ibuprofen,  meloxicam , ibuprofen (including Advil,                            Motrin or Nuprin), naproxen (including Aleve), or                            any other non-steroidal anti-inflammatory drugs for                            2 weeks.                           - Hold Zoloft  for a week, then restart Procedure Code(s):        --- Professional ---                           [phone], 22, Colonoscopy, flexible; with removal of                            tumor(s), polyp(s), or other lesion(s) by snare                            technique Diagnosis Code(s):        --- Professional ---                           D12.5, Benign neoplasm of sigmoid colon                           D12.3, Benign neoplasm of transverse colon (hepatic                            flexure or splenic flexure)                           D12.2, Benign neoplasm  of ascending colon                           D12.0, Benign neoplasm of cecum                           R19.5, Other fecal abnormalities CPT copyright 2022 American Medical Association. All rights reserved. The codes documented in this report are preliminary and upon coder review may  be revised to meet current compliance requirements. [name], MD [name],  01/17/2024 11:04:17 AM This report has been signed electronically. Number of Addenda: 0

## 2024-01-17 NOTE — Transfer of Care (Signed)
 Immediate Anesthesia Transfer of Care Note  Patient: Renee Dudley  Procedure(s) Performed: COLONOSCOPY  Patient Location: PACU  Anesthesia Type:MAC  Level of Consciousness: awake, oriented, and pateint uncooperative  Airway & Oxygen Therapy: Patient Spontanous Breathing  Post-op Assessment: Report given to RN, Post -op Vital signs reviewed and stable, and Patient moving all extremities X 4  Post vital signs: Reviewed and stable  Last Vitals:  Vitals Value Taken Time  BP 152/90 01/17/24 1100  Temp    Pulse 91 01/17/24 1100  Resp 25 01/17/24 1100  SpO2 95 % 01/17/24 1100  Vitals shown include unfiled device data.  Last Pain:  Vitals:   01/17/24 0928  TempSrc:   PainSc: 10-Worst pain ever      Patients Stated Pain Goal: 10 (01/17/24 0824)  Complications: No notable events documented.

## 2024-01-17 NOTE — Discharge Instructions (Addendum)
 You are being discharged to home.  Full liquid diet for next 2 days, then resume previous diet.  We are waiting for your pathology results.  Your physician has recommended a repeat colonoscopy in six months for retreatment.  Do not take any aspirin  (Goody/BC powders), ibuprofen, meloxicam , ibuprofen (including Advil, Motrin or Nuprin), naproxen (including Aleve), or any other non-steroidal anti-inflammatory drugs for 2 weeks.  Hold Zoloft  for a week, then restart

## 2024-01-17 NOTE — Interval H&P Note (Signed)
 History and Physical Interval Note:  01/17/2024 8:07 AM  Renee Dudley  has presented today for surgery, with the diagnosis of POSITIVE FOBT, ANEMIA.  The various methods of treatment have been discussed with the patient and family. After consideration of risks, benefits and other options for treatment, the patient has consented to  Procedure(s) with comments: COLONOSCOPY (N/A) - 9:30am;asa 3 as a surgical intervention.  The patient's history has been reviewed, patient examined, no change in status, stable for surgery.  I have reviewed the patient's chart and labs.  Questions were answered to the patient's satisfaction.     Betzy Barbier Castaneda Mayorga

## 2024-01-17 NOTE — Anesthesia Preprocedure Evaluation (Signed)
 Anesthesia Evaluation  Patient identified by MRN, date of birth, ID band Patient awake    Reviewed: Allergy & Precautions, H&P , NPO status , Patient's Chart, lab work & pertinent test results, reviewed documented beta blocker date and time   Airway Mallampati: II  TM Distance: >3 FB Neck ROM: full    Dental no notable dental hx.    Pulmonary neg pulmonary ROS   Pulmonary exam normal breath sounds clear to auscultation       Cardiovascular Exercise Tolerance: Good hypertension, + CAD  negative cardio ROS  Rhythm:regular Rate:Normal     Neuro/Psych  Headaches PSYCHIATRIC DISORDERS Anxiety Depression    negative neurological ROS  negative psych ROS   GI/Hepatic negative GI ROS, Neg liver ROS,GERD  ,,  Endo/Other    Class 3 obesity  Renal/GU Renal diseasenegative Renal ROS  negative genitourinary   Musculoskeletal   Abdominal   Peds  Hematology negative hematology ROS (+)   Anesthesia Other Findings   Reproductive/Obstetrics negative OB ROS                             Anesthesia Physical Anesthesia Plan  ASA: 3  Anesthesia Plan: General   Post-op Pain Management:    Induction:   PONV Risk Score and Plan: Propofol  infusion  Airway Management Planned:   Additional Equipment:   Intra-op Plan:   Post-operative Plan:   Informed Consent: I have reviewed the patients History and Physical, chart, labs and discussed the procedure including the risks, benefits and alternatives for the proposed anesthesia with the patient or authorized representative who has indicated his/her understanding and acceptance.     Dental Advisory Given  Plan Discussed with: CRNA  Anesthesia Plan Comments:        Anesthesia Quick Evaluation

## 2024-01-18 ENCOUNTER — Encounter (HOSPITAL_COMMUNITY): Payer: Self-pay | Admitting: Gastroenterology

## 2024-01-18 LAB — SURGICAL PATHOLOGY

## 2024-01-20 ENCOUNTER — Encounter (HOSPITAL_COMMUNITY): Payer: Self-pay | Admitting: Gastroenterology

## 2024-01-20 NOTE — Anesthesia Postprocedure Evaluation (Signed)
 Anesthesia Post Note  Patient: Renee Dudley  Procedure(s) Performed: COLONOSCOPY  Patient location during evaluation: Phase II Anesthesia Type: MAC Level of consciousness: awake Pain management: pain level controlled Vital Signs Assessment: post-procedure vital signs reviewed and stable Respiratory status: spontaneous breathing and respiratory function stable Cardiovascular status: blood pressure returned to baseline and stable Postop Assessment: no headache and no apparent nausea or vomiting Anesthetic complications: no Comments: Late entry   No notable events documented.   Last Vitals:  Vitals:   01/17/24 1100 01/17/24 1111  BP: (!) 152/90 (!) 185/88  Pulse: 93 91  Resp: (!) 22 20  Temp: 36.4 C 36.4 C  SpO2: 97% 98%    Last Pain:  Vitals:   01/18/24 1538  TempSrc:   PainSc: 0-No pain                 Coretha Dew

## 2024-01-26 ENCOUNTER — Encounter: Payer: Self-pay | Admitting: Nurse Practitioner

## 2024-01-26 ENCOUNTER — Ambulatory Visit: Attending: Nurse Practitioner | Admitting: Nurse Practitioner

## 2024-01-26 VITALS — BP 118/78 | HR 74 | Ht 65.0 in | Wt 258.0 lb

## 2024-01-26 DIAGNOSIS — I2510 Atherosclerotic heart disease of native coronary artery without angina pectoris: Secondary | ICD-10-CM

## 2024-01-26 DIAGNOSIS — I480 Paroxysmal atrial fibrillation: Secondary | ICD-10-CM

## 2024-01-26 DIAGNOSIS — I5032 Chronic diastolic (congestive) heart failure: Secondary | ICD-10-CM

## 2024-01-26 DIAGNOSIS — N183 Chronic kidney disease, stage 3 unspecified: Secondary | ICD-10-CM

## 2024-01-26 DIAGNOSIS — I890 Lymphedema, not elsewhere classified: Secondary | ICD-10-CM

## 2024-01-26 DIAGNOSIS — I10 Essential (primary) hypertension: Secondary | ICD-10-CM

## 2024-01-26 DIAGNOSIS — E782 Mixed hyperlipidemia: Secondary | ICD-10-CM

## 2024-01-26 MED ORDER — NITROGLYCERIN 0.4 MG SL SUBL: 0.4000 mg | SUBLINGUAL_TABLET | SUBLINGUAL | 3 refills | Status: AC | PRN

## 2024-01-26 NOTE — Patient Instructions (Signed)

## 2024-01-26 NOTE — Progress Notes (Signed)
 Cardiology Office Note:  .   Date:  01/26/2024 ID:  Renee Dudley, DOB Feb 04, 1936, MRN [id number] PCP: Orest Bio, MD  [facility] Providers Cardiologist:  Teddie Favre, MD    History of Present Illness: Renee Dudley   Renee Dudley is a 88 y.o. female with PMH of HFpEF, nonobstructive CAD, exertional shortness of breath, mixed HLD, bilateral leg edema and lymphedema, hypertension, history of renal insufficiency, who presents today for hospital follow-up.  Previous cardiovascular history includes cardiac catheterization in 2007 that revealed nonobstructive CAD.  Myoview  in 2021 was low risk, no ischemia noted with EF 72%.   Last seen by Dr. Londa Rival on December 01, 2022.  Was doing well, did note worsening leg swelling/lymphedema.  Dr. Londa Rival discussed using Lasix  60 mg for few days at a time, recommended elevating her legs.   Admitted May 2024 with intermittent chest pain.  Was ruled out for ACS.  Stress test showed nondiagnostic EKG and subsequent imaging was negative for reversible ischemia. TTE EF 65 to 70%, grade 1 DD.  Since I last saw her in the office, she visited [facility] on August 02, 2023 for leg swelling and shortness of breath.  She stated at the time of ED visit that she did not take her medication.  Described DOE earlier in the day, felt better during time of ED arrival.  Denied any chest pain.  Diuresed more than a liter at the time.  Was discharged after diuresis.  08/15/2023 - Today she presents for scheduled follow-up.  She says that her leg swelling has been getting worse recently, says her RLS is bothersome to her.  Confirms with me that she is not taking Entresto  as previously prescribed and continues to take losartan  only. Denies any chest pain, shortness of breath, palpitations, syncope, presyncope, dizziness, orthopnea, PND, or significant weight changes, acute bleeding, or claudication.  Says she was going to pick something up recently and felt the right  side of her rib cage pop, does have history of frozen shoulder along right side, has received injections for this in the past, says she has not had any on the left side.  10/31/2023 - Presents today for follow-up. Admits to stable leg edema. She has been having recent issues with her back and scheduled to see a surgeon next week. Denies any chest pain, palpitations, syncope, presyncope, dizziness, orthopnea, PND, or significant weight changes, acute bleeding, or claudication. Does admit to some shortness of breath with exertion.   Recent admission to [facility] - weakness, SOB, tx for UTI by PCP because of hematuria. Tx for sepsis d/t UTI, AKI, elevated trop, also noted to have hypokalemia. New onset of A-fib with RVR. Cardiology consulted. Was noted not to start DOAC's d/t hx of melena. Echo in hospital revealed revealed EF 65-70&, grade 1 DD, mild AR. Favored Diltiazem for rate control. Monitor was arranged and worn by patient for 2 weeks - see report below.   12/15/2023 - Today she presents for follow-up. Tells me she is no longer taking Jardiance  as she has been tx for UTI. Admits to worsening leg edema. Son is present in room and states she is not consistent sometimes with taking her fluid pill. She denies any chest pain, palpitations, syncope, presyncope, dizziness, orthopnea, PND, swelling or significant weight changes, acute bleeding, or claudication. Does notice some DOE. Tells me she is pending colonoscopy with GI.   01/26/2024 - Tells me she had a recent colonoscopy and has not heard back  regarding results. Tells me she has not had any recent bleeding issues. Tells me she has not heard back about whether she can return to taking her blood thinner. Denies any chest pain, palpitations, syncope, presyncope, dizziness, orthopnea, PND, significant weight changes, acute bleeding, or claudication.  ROS: Negative.  See HPI.  Studies Reviewed: Renee Dudley    EKG: EKG is not ordered today.      Cardiac  monitor [facility]) 11/2023:  Ambulatory ECG monitoring was performed from 11/15/23 to 11/29/23.  - The predominant rhythm was sinus rhythm, with the rate ranging from 44  to 103 and averaging 74 bpm when in sinus rhythm  - 3% burden of atrial fibrillation observed, longest episode 10 hours 9  minutes.  Heart rate range 77-173 with average heart rate 115 bpm while in  the arrhythmia  - Occasional supraventricular ectopics (PACs) were recorded (1.0% burden)  with 29 episodes of SVT, longest lasting 6 beats at 153 bpm  - Rare ventricular ectopics (PVCs) were recorded with no recorded episodes  of wide-complex tachycardia  - No patient-initiated recordings/events were submitted.  - No pauses >3 seconds or high-grade AV block detected   Myoview  01/2023 [facility]): 1. No reversible ischemia . 2. Normal left ventricular wall motion. 3. Left ventricular ejection fraction 76% 4. Non invasive risk stratification*: Low *2012 Appropriate Use Criteria for Coronary Revascularization Focused Update: J Am Coll Cardiol. 2012;59(9):857-881. http://content.dementiazones.com.aspx?articleid=[email or web address] Electronically Signed By: Deboraha Fallow M.D. On: 01/19/2023 15:31    Echo 01/2023 [facility]):  The left ventricle is normal in size with upper normal wall thickness. 2. The left ventricular systolic function is normal, LVEF is visually estimated at 65-70%. 3. There is grade I diastolic dysfunction (impaired relaxation). 4. There is mild aortic regurgitation. 5. The left atrium is mildly dilated in size. 6. The right ventricle is normal in size, with normal systolic function. Left Ventricle The left ventricle is normal in size with upper normal wall thickness. The left ventricular systolic function is normal, LVEF is visually estimated at 65-70%. There is grade I diastolic dysfunction (impaired relaxation). Right Ventricle The right ventricle is normal in size, with normal systolic function. Left Atrium The  left atrium is mildly dilated in size. Right Atrium The right atrium is upper normal in size. Aortic Valve The aortic valve is trileaflet with mildly thickened leaflets with normal excursion. There is mild aortic regurgitation. There is no evidence of a significant transvalvular gradient. Mitral Valve The mitral valve leaflets are normal with normal leaflet mobility. Mitral annular calcification is present. There is trivial mitral valve regurgitation. Tricuspid Valve The tricuspid valve leaflets are normal, with normal leaflet mobility. There is trivial tricuspid regurgitation. The pulmonary systolic pressure cannot be estimated due to insufficient TR signal. Pulmonic Valve The pulmonic valve is normal. There is mild pulmonic regurgitation. There is no evidence of a significant transvalvular gradient. Aorta The aorta is normal in size in the visualized segments. Inferior Vena Cava IVC size and inspiratory change suggest normal right atrial pressure. (0-5 mmHg). Pericardium/Pleural There is no pericardial effusion.    Physical Exam:   VS:  BP 118/78   Pulse 74   Ht 5\' 5"  (1.651 m)   Wt 258 lb (117 kg)   SpO2 99%   BMI 42.93 kg/m    Wt Readings from Last 3 Encounters:  01/26/24 258 lb (117 kg)  01/17/24 256 lb (116.1 kg)  01/05/24 256 lb (116.1 kg)    GEN: Morbidly obese female in no acute distress  NECK: No JVD; No carotid bruits CARDIAC: S1/S2, RRR, no murmurs, rubs, gallops RESPIRATORY: Clear and diminished, no adventitious breath sounds heard on exam  EXTREMITIES:  Nonpitting lymphedema to BLE (appears stable since last office visit), RLE slightly more increased from LLE; No deformity   ASSESSMENT AND PLAN: .    HFpEF, lymphedema Etiology most likely nonischemic. Stage C, NYHA class II-III symptoms. Echo at [facility] during most recent hospitalization showed normal EF, grade 1 DD, mild AR. Does notice some stable leg edema, low suspicion for DVT. GDMT limited, could not tolerate [place] - caused  UTI.  Continue rest of medication regimen and discussed medication compliance.  Low sodium diet, fluid restriction <2L, and daily weights encouraged. Educated to contact our office for weight gain of 2 lbs overnight or 5 lbs in one week. Offered lymphedema clinic referral, pt declines.   2. New onset A-fib/PAF Newly dx during most recent hospital stay at [facility]. In setting of recent hospitalization for sepsis from UTI. Most likely recent illness triggered her A-fib. Most recent monitor arranged revealed predominantly sinus rhythm with 3% A-fib burden.  Longest episode lasted a little over 10 hours. Was noted not to start DOAC's d/t hx of melena.  Will consult GI regarding about her OAC. She denies any tachycardia or palpitations.  Heart rate is well-controlled despite not being on AV nodal blockers.  Will continue to monitor for now.   Care and ED precautions discussed.  Non-obstructive CAD Stable with no anginal symptoms. No indication for ischemic evaluation. NST at [facility] noted above was negative for ischemia. Continue ASA, imdur , rosuvastatin , and NTG PRN. Heart healthy diet and regular cardiovascular exercise encouraged. Will refill NTG.    Mixed HLD Will request most recent labs from PCP's office. Continue rosuvastatin .  Heart healthy diet and regular cardiovascular exercise encouraged.   4. HTN BP stable today, BP well controlled at home. Discussed to monitor BP at home at least 2 hours after medications and sitting for 5-10 minutes. No medication changes at this time. Heart healthy diet and regular cardiovascular exercise encouraged.    CKD stage 3 Most recent sCr on file is 1.00. Avoid nephrotoxic agents. Could not tolerate SGLT2i. Continue to follow with PCP and Nephrology.   Dispo: Follow-up with Dr. Londa Rival or APP in 6 weeks or sooner if anything changes.  Signed, Lasalle Pointer, NP

## 2024-02-09 ENCOUNTER — Ambulatory Visit (INDEPENDENT_AMBULATORY_CARE_PROVIDER_SITE_OTHER): Payer: Self-pay | Admitting: Gastroenterology

## 2024-02-13 ENCOUNTER — Encounter: Payer: Self-pay | Admitting: Nurse Practitioner

## 2024-02-13 ENCOUNTER — Telehealth: Payer: Self-pay | Admitting: Nurse Practitioner

## 2024-02-13 DIAGNOSIS — Z79899 Other long term (current) drug therapy: Secondary | ICD-10-CM

## 2024-02-13 DIAGNOSIS — N183 Chronic kidney disease, stage 3 unspecified: Secondary | ICD-10-CM

## 2024-02-13 DIAGNOSIS — I5032 Chronic diastolic (congestive) heart failure: Secondary | ICD-10-CM

## 2024-02-13 MED ORDER — APIXABAN 5 MG PO TABS: 5.0000 mg | ORAL_TABLET | Freq: Two times a day (BID) | ORAL | 3 refills | Status: DC

## 2024-02-13 NOTE — Telephone Encounter (Signed)
-----   Message from Lasalle Pointer sent at 02/12/2024  7:03 PM EDT ----- Kandee Orion back from patient's gastroenterologist. Ortencia Blamer to start blood thinner. Please stop Aspirin  and begin Eliquis 5 mg BID for history of A-fib. Please get CBC in 1 week.   Thanks!   Best, Lasalle Pointer, NP ----- Message ----- From: Urban Garden, MD Sent: 02/10/2024   4:31 PM EDT To: Lasalle Pointer, NP  Hi,  Sorry for the last reply, [place]. She should be ok to restart anticoagulation. Thanks ----- Message ----- From: Lasalle Pointer, NP Sent: 02/02/2024   3:38 PM EDT To: Urban Garden, MD  Is she okay to return to her oral anticoagulant after her colonoscopy?   Wanted to check.   Thanks!   Best, Lasalle Pointer, NP

## 2024-02-13 NOTE — Telephone Encounter (Signed)
 Letter mailed to patient per her request for written instructions.

## 2024-02-13 NOTE — Telephone Encounter (Signed)
 Patient informed and verbalized understanding of plan. Patient refuses to stop aspirin  and start eliquis  Patient states se does not need it due to she can brush against anything and cause herself to bleed

## 2024-02-13 NOTE — Telephone Encounter (Signed)
 Patient has agreed to start Eliquis 5 Mg BID and have labs done 1 week at [facility]

## 2024-02-13 NOTE — Telephone Encounter (Signed)
 Patient not available not able to leave voicemail

## 2024-06-20 ENCOUNTER — Encounter (INDEPENDENT_AMBULATORY_CARE_PROVIDER_SITE_OTHER): Payer: Self-pay | Admitting: *Deleted

## 2024-07-18 ENCOUNTER — Encounter (INDEPENDENT_AMBULATORY_CARE_PROVIDER_SITE_OTHER): Payer: Self-pay | Admitting: Gastroenterology

## 2024-08-02 ENCOUNTER — Ambulatory Visit: Admitting: Nurse Practitioner

## 2024-08-20 ENCOUNTER — Ambulatory Visit: Admitting: Cardiology

## 2024-08-22 ENCOUNTER — Other Ambulatory Visit: Payer: Self-pay | Admitting: Nurse Practitioner

## 2024-08-22 NOTE — Telephone Encounter (Signed)
 Prescription refill request for Eliquis  received. Indication: PAF Last office visit: 01/26/24  FORBES Crate NP Scr: 1.0 on 12/20/23  Epic Age: 88 Weight: 117kg  Based on above findings Eliquis  5mg  twice daily is the appropriate dose.  Refill approved.

## 2024-09-28 ENCOUNTER — Ambulatory Visit: Attending: Nurse Practitioner | Admitting: Nurse Practitioner

## 2024-09-28 ENCOUNTER — Encounter: Payer: Self-pay | Admitting: Nurse Practitioner

## 2024-09-28 VITALS — BP 148/100 | HR 64 | Ht 65.0 in | Wt 262.0 lb

## 2024-09-28 DIAGNOSIS — E782 Mixed hyperlipidemia: Secondary | ICD-10-CM | POA: Diagnosis not present

## 2024-09-28 DIAGNOSIS — I5032 Chronic diastolic (congestive) heart failure: Secondary | ICD-10-CM

## 2024-09-28 DIAGNOSIS — I4891 Unspecified atrial fibrillation: Secondary | ICD-10-CM

## 2024-09-28 DIAGNOSIS — N183 Chronic kidney disease, stage 3 unspecified: Secondary | ICD-10-CM | POA: Diagnosis not present

## 2024-09-28 DIAGNOSIS — I10 Essential (primary) hypertension: Secondary | ICD-10-CM

## 2024-09-28 DIAGNOSIS — Z79899 Other long term (current) drug therapy: Secondary | ICD-10-CM | POA: Diagnosis not present

## 2024-09-28 DIAGNOSIS — I890 Lymphedema, not elsewhere classified: Secondary | ICD-10-CM | POA: Diagnosis not present

## 2024-09-28 DIAGNOSIS — R0609 Other forms of dyspnea: Secondary | ICD-10-CM

## 2024-09-28 DIAGNOSIS — I2510 Atherosclerotic heart disease of native coronary artery without angina pectoris: Secondary | ICD-10-CM

## 2024-09-28 MED ORDER — TORSEMIDE 20 MG PO TABS: 40.0000 mg | ORAL_TABLET | Freq: Every day | ORAL | 5 refills | Status: AC

## 2024-09-28 MED ORDER — BLOOD PRESSURE KIT: 1.0000 | PACK | Freq: Once | 0 refills | Status: AC

## 2024-09-28 MED ORDER — APIXABAN 5 MG PO TABS: 5.0000 mg | ORAL_TABLET | Freq: Two times a day (BID) | ORAL | 5 refills | Status: AC

## 2024-09-28 NOTE — Progress Notes (Unsigned)
 " Cardiology Office Note:  .   Date: 09/28/2024 ID:  Rollene JONELLE Gallus, DOB Jun 06, 1936, MRN [id number] PCP: Verneda Charmaine JONELLE, FNP  [facility] Providers Cardiologist:  Jayson Sierras, MD    History of Present Illness: SABRA   EREKA BRAU is a 89 y.o. female with PMH of HFpEF, nonobstructive CAD, exertional shortness of breath, mixed HLD, bilateral leg edema and lymphedema, hypertension, history of renal insufficiency, who presents today for 6 month follow-up.  Previous cardiovascular history includes cardiac catheterization in 2007 that revealed nonobstructive CAD.  Myoview  in 2021 was low risk, no ischemia noted with EF 72%.   Last seen by Dr. Sierras on December 01, 2022.  Was doing well, did note worsening leg swelling/lymphedema.  Dr. Sierras discussed using Lasix  60 mg for few days at a time, recommended elevating her legs.   Admitted May 2024 with intermittent chest pain.  Was ruled out for ACS.  Stress test showed nondiagnostic EKG and subsequent imaging was negative for reversible ischemia. TTE EF 65 to 70%, grade 1 DD.  Since I last saw her in the office, she visited [facility] on August 02, 2023 for leg swelling and shortness of breath.  She stated at the time of ED visit that she did not take her medication.  Described DOE earlier in the day, felt better during time of ED arrival.  Denied any chest pain.  Diuresed more than a liter at the time.  Was discharged after diuresis.  08/15/2023 - Today she presents for scheduled follow-up.  She says that her leg swelling has been getting worse recently, says her RLS is bothersome to her.  Confirms with me that she is not taking Entresto  as previously prescribed and continues to take losartan  only. Denies any chest pain, shortness of breath, palpitations, syncope, presyncope, dizziness, orthopnea, PND, or significant weight changes, acute bleeding, or claudication.  Says she was going to pick something up recently and felt the  right side of her rib cage pop, does have history of frozen shoulder along right side, has received injections for this in the past, says she has not had any on the left side.  10/31/2023 - Presents today for follow-up. Admits to stable leg edema. She has been having recent issues with her back and scheduled to see a surgeon next week. Denies any chest pain, palpitations, syncope, presyncope, dizziness, orthopnea, PND, or significant weight changes, acute bleeding, or claudication. Does admit to some shortness of breath with exertion.   Recent admission to [facility] - weakness, SOB, tx for UTI by PCP because of hematuria. Tx for sepsis d/t UTI, AKI, elevated trop, also noted to have hypokalemia. New onset of A-fib with RVR. Cardiology consulted. Was noted not to start DOAC's d/t hx of melena. Echo in hospital revealed revealed EF 65-70&, grade 1 DD, mild AR. Favored Diltiazem for rate control. Monitor was arranged and worn by patient for 2 weeks - see report below.   12/15/2023 - Today she presents for follow-up. Tells me she is no longer taking Jardiance  as she has been tx for UTI. Admits to worsening leg edema. Son is present in room and states she is not consistent sometimes with taking her fluid pill. She denies any chest pain, palpitations, syncope, presyncope, dizziness, orthopnea, PND, swelling or significant weight changes, acute bleeding, or claudication. Does notice some DOE. Tells me she is pending colonoscopy with GI.   01/26/2024 - Tells me she had a recent colonoscopy and has not heard  back regarding results. Tells me she has not had any recent bleeding issues. Tells me she has not heard back about whether she can return to taking her blood thinner. Denies any chest pain, palpitations, syncope, presyncope, dizziness, orthopnea, PND, significant weight changes, acute bleeding, or claudication.  ED visit in October 2025 at [facility] post fall. Imaging negative for anything acute.    09/28/2024 - Here for overdue 6 month follow-up. Admits to issues with bilateral rotator cuffs. Says she has noticed more leg swelling and also notices some skin discoloration there. Denies any fever, chills. Denies any chest pain, shortness of breath, palpitations, syncope, presyncope, dizziness, orthopnea, PND, significant weight changes, acute bleeding, or claudication.  ROS: Negative.  See HPI.  Studies Reviewed: SABRA    EKG: EKG is not ordered today.      Cardiac monitor [facility]) 11/2023:  Ambulatory ECG monitoring was performed from 11/15/23 to 11/29/23.  - The predominant rhythm was sinus rhythm, with the rate ranging from 44  to 103 and averaging 74 bpm when in sinus rhythm  - 3% burden of atrial fibrillation observed, longest episode 10 hours 9  minutes.  Heart rate range 77-173 with average heart rate 115 bpm while in  the arrhythmia  - Occasional supraventricular ectopics (PACs) were recorded (1.0% burden)  with 29 episodes of SVT, longest lasting 6 beats at 153 bpm  - Rare ventricular ectopics (PVCs) were recorded with no recorded episodes  of wide-complex tachycardia  - No patient-initiated recordings/events were submitted.  - No pauses >3 seconds or high-grade AV block detected   Myoview  01/2023 [facility]): 1. No reversible ischemia . 2. Normal left ventricular wall motion. 3. Left ventricular ejection fraction 76% 4. Non invasive risk stratification*: Low *2012 Appropriate Use Criteria for Coronary Revascularization Focused Update: J Am Coll Cardiol. 2012;59(9):857-881. http://content.[email or web address]?[email or web address] Electronically Signed By: Jackquline Boxer M.D. On: 01/19/2023 15:31    Echo 01/2023 [facility]):  The left ventricle is normal in size with upper normal wall thickness. 2. The left ventricular systolic function is normal, LVEF is visually estimated at 65-70%. 3. There is grade I diastolic dysfunction (impaired relaxation). 4. There is  mild aortic regurgitation. 5. The left atrium is mildly dilated in size. 6. The right ventricle is normal in size, with normal systolic function. Left Ventricle The left ventricle is normal in size with upper normal wall thickness. The left ventricular systolic function is normal, LVEF is visually estimated at 65-70%. There is grade I diastolic dysfunction (impaired relaxation). Right Ventricle The right ventricle is normal in size, with normal systolic function. Left Atrium The left atrium is mildly dilated in size. Right Atrium The right atrium is upper normal in size. Aortic Valve The aortic valve is trileaflet with mildly thickened leaflets with normal excursion. There is mild aortic regurgitation. There is no evidence of a significant transvalvular gradient. Mitral Valve The mitral valve leaflets are normal with normal leaflet mobility. Mitral annular calcification is present. There is trivial mitral valve regurgitation. Tricuspid Valve The tricuspid valve leaflets are normal, with normal leaflet mobility. There is trivial tricuspid regurgitation. The pulmonary systolic pressure cannot be estimated due to insufficient TR signal. Pulmonic Valve The pulmonic valve is normal. There is mild pulmonic regurgitation. There is no evidence of a significant transvalvular gradient. Aorta The aorta is normal in size in the visualized segments. Inferior Vena Cava IVC size and inspiratory change suggest normal right atrial pressure. (0-5 mmHg). Pericardium/Pleural There is no pericardial effusion.    Physical  Exam:   VS:  BP (!) 148/100 (BP Location: Left Wrist)   Pulse 64   Ht 5' 5 (1.651 m)   Wt 262 lb (118.8 kg)   SpO2 97%   BMI 43.60 kg/m    Wt Readings from Last 3 Encounters:  09/28/24 262 lb (118.8 kg)  01/26/24 258 lb (117 kg)  01/17/24 256 lb (116.1 kg)    GEN: Morbidly obese female in no acute distress NECK: No JVD; No carotid bruits CARDIAC: S1/S2, RRR, no murmurs, rubs, gallops RESPIRATORY:  Clear and diminished, no adventitious breath sounds heard on exam  EXTREMITIES:  Nonpitting lymphedema to BLE (appears increased since last office visit), RLE slightly more increased from LLE, with slight skin discoloration; No deformity   ASSESSMENT AND PLAN: .    HFpEF, lymphedema, medication management Etiology most likely nonischemic. Stage C, NYHA class II-III symptoms. Echo at [facility] during most recent hospitalization showed normal EF, grade 1 DD, mild AR. Does notice some worsening leg edema, low suspicion for DVT.  Will stop Lasix  and begin Torsemide  40 mg daily and obtain BMET, proBNP, and Mag in 1-2 weeks. GDMT limited, could not tolerate [place] - caused UTI.  Continue rest of medication regimen and discussed medication compliance.  Low sodium diet, fluid restriction <2L, and daily weights encouraged. Educated to contact our office for weight gain of 2 lbs overnight or 5 lbs in one week. Offered lymphedema clinic referral, pt declines.   2. A-fib/PAF Newly dx during most past hospital stay at [facility]. In setting of recent hospitalization for sepsis from UTI. Most likely recent illness triggered her A-fib. Most recent monitor arranged revealed predominantly sinus rhythm with 3% A-fib burden.  Longest episode lasted a little over 10 hours. Continue Eliquis  for stroke prevention - will provide refill. She denies any tachycardia or palpitations.  Heart rate is well-controlled despite not being on AV nodal blockers.  Will continue to monitor for now.   Care and ED precautions discussed.  Non-obstructive CAD Stable with no anginal symptoms. No indication for ischemic evaluation. NST at [facility] noted above was negative for ischemia. Continue imdur , rosuvastatin , and NTG PRN. Not on ASA d/t being on Eliquis . Heart healthy diet and regular cardiovascular exercise encouraged.    Mixed HLD Will request most recent labs from PCP's office. Continue rosuvastatin .  Heart healthy diet and regular cardiovascular  exercise encouraged.   4. HTN BP is elevated today. Does not check BP at home. Will provide BP cuff and she will update me in 2-3 weeks with her BP trends. Discussed to monitor BP at home at least 2 hours after medications and sitting for 5-10 minutes. No medication changes at this time. Heart healthy diet and regular cardiovascular exercise encouraged. If BP not at goal, consider starting Aldactone.    CKD stage 3 She is due for labs. Avoid nephrotoxic agents. Could not tolerate SGLT2i. Continue to follow with PCP and Nephrology.   Dispo: Follow-up with Dr. Debera or APP in 4-6 weeks or sooner if anything changes.  Signed, Almarie Crate, NP   "

## 2024-09-28 NOTE — Patient Instructions (Signed)
 Medication Instructions:  Your physician has recommended you make the following change in your medication:  Stop taking Lasix  Start taking Torsemide  40 mg daily Continue taking all other medications as prescribed   Labwork: BMET, ProBNP and Magnesium to be completed in 1-2 weeks at [facility]  Testing/Procedures: None  Follow-Up: Your physician recommends that you schedule a follow-up appointment in: 4-6 weeks  Any Other Special Instructions Will Be Listed Below (If Applicable). HEART FAILURE INSTRUCTION SHEET  Follow a low-salt diet-you are allowed no more than 2,000 mg of sodium per day. Watch your fluid intake. In general, you should not be taking more than 64 ounces a day (no more than 8 glasses per day). Sometimes we refer to this as 2 liters per day. This includes sources of water in food like soup, coffee, tea, milk etc. Weigh yourself on the same scale at the same time of the day preferably immediately after your first void. Keep a log of your weights. Call your doctor: (Anytime you feel any of the following symptoms)  3 lbs weight gain overnight or 5 lbs within a week Shortness of breath, with or without a day hacking cough Swelling in hands, feet or stomach If you have to sleep on extra pillows at night in order to breathe   IT IS IMPORTANT TO LET YOUR DOCTOR KNOW EARLY ON IF YOU ARE HAVING SYMPTOMS SO WE CAN HELP YOU!     If you need a refill on your cardiac medications before your next appointment, please call your pharmacy.

## 2024-10-13 ENCOUNTER — Other Ambulatory Visit: Payer: Self-pay | Admitting: Cardiology

## 2024-10-22 ENCOUNTER — Ambulatory Visit: Admitting: Cardiology

## 2024-11-06 ENCOUNTER — Ambulatory Visit: Admitting: Cardiology

## 2024-11-09 ENCOUNTER — Ambulatory Visit: Admitting: Nurse Practitioner
